# Patient Record
Sex: Male | Born: 1950 | Race: White | Hispanic: No | Marital: Married | Smoking: Former smoker
Health system: Southern US, Community
[De-identification: ages and names within clinical notes are randomized; demographics above are authoritative.]

## PROBLEM LIST (undated history)

## (undated) DIAGNOSIS — F419 Anxiety disorder, unspecified: Secondary | ICD-10-CM

## (undated) DIAGNOSIS — K219 Gastro-esophageal reflux disease without esophagitis: Secondary | ICD-10-CM

## (undated) DIAGNOSIS — R0989 Other specified symptoms and signs involving the circulatory and respiratory systems: Secondary | ICD-10-CM

## (undated) DIAGNOSIS — I10 Essential (primary) hypertension: Secondary | ICD-10-CM

## (undated) DIAGNOSIS — R011 Cardiac murmur, unspecified: Secondary | ICD-10-CM

## (undated) DIAGNOSIS — F329 Major depressive disorder, single episode, unspecified: Secondary | ICD-10-CM

## (undated) DIAGNOSIS — R7309 Other abnormal glucose: Secondary | ICD-10-CM

## (undated) DIAGNOSIS — C801 Malignant (primary) neoplasm, unspecified: Secondary | ICD-10-CM

## (undated) DIAGNOSIS — Q631 Lobulated, fused and horseshoe kidney: Secondary | ICD-10-CM

## (undated) DIAGNOSIS — D509 Iron deficiency anemia, unspecified: Secondary | ICD-10-CM

## (undated) DIAGNOSIS — R253 Fasciculation: Secondary | ICD-10-CM

## (undated) DIAGNOSIS — R0609 Other forms of dyspnea: Secondary | ICD-10-CM

## (undated) DIAGNOSIS — E669 Obesity, unspecified: Secondary | ICD-10-CM

## (undated) HISTORY — DX: Other specified symptoms and signs involving the circulatory and respiratory systems: R09.89

## (undated) HISTORY — DX: Other forms of dyspnea: R06.09

## (undated) HISTORY — DX: Other abnormal glucose: R73.09

## (undated) HISTORY — DX: Cardiac murmur, unspecified: R01.1

## (undated) HISTORY — DX: Essential (primary) hypertension: I10

## (undated) HISTORY — DX: Gastro-esophageal reflux disease without esophagitis: K21.9

## (undated) HISTORY — DX: Major depressive disorder, single episode, unspecified: F32.9

## (undated) HISTORY — PX: COLONOSCOPY: SHX174

## (undated) HISTORY — DX: Obesity, unspecified: E66.9

## (undated) HISTORY — DX: Malignant (primary) neoplasm, unspecified: C80.1

## (undated) HISTORY — PX: TONSILLECTOMY: SUR1361

## (undated) HISTORY — DX: Iron deficiency anemia, unspecified: D50.9

## (undated) HISTORY — DX: Fasciculation: R25.3

## (undated) HISTORY — PX: POLYPECTOMY: SHX149

## (undated) HISTORY — DX: Lobulated, fused and horseshoe kidney: Q63.1

---
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
## 1992-10-02 HISTORY — PX: SHOULDER SURGERY: SHX246

## 1999-09-06 ENCOUNTER — Encounter: Admission: RE | Admit: 1999-09-06 | Discharge: 1999-09-06 | Payer: Self-pay | Admitting: Family Medicine

## 1999-09-06 ENCOUNTER — Encounter: Payer: Self-pay | Admitting: Family Medicine

## 1999-10-21 ENCOUNTER — Encounter: Payer: Self-pay | Admitting: Family Medicine

## 1999-10-21 ENCOUNTER — Encounter: Admission: RE | Admit: 1999-10-21 | Discharge: 1999-10-21 | Payer: Self-pay | Admitting: Family Medicine

## 2000-10-16 ENCOUNTER — Encounter: Payer: Self-pay | Admitting: Family Medicine

## 2000-10-16 ENCOUNTER — Encounter: Admission: RE | Admit: 2000-10-16 | Discharge: 2000-10-16 | Payer: Self-pay | Admitting: Family Medicine

## 2001-01-11 ENCOUNTER — Encounter: Admission: RE | Admit: 2001-01-11 | Discharge: 2001-01-11 | Payer: Self-pay | Admitting: Family Medicine

## 2001-01-11 ENCOUNTER — Encounter: Payer: Self-pay | Admitting: Family Medicine

## 2004-09-28 ENCOUNTER — Encounter: Admission: RE | Admit: 2004-09-28 | Discharge: 2004-09-28 | Payer: Self-pay | Admitting: Family Medicine

## 2006-09-19 ENCOUNTER — Encounter: Admission: RE | Admit: 2006-09-19 | Discharge: 2006-09-19 | Payer: Self-pay | Admitting: Family Medicine

## 2007-03-21 ENCOUNTER — Ambulatory Visit (HOSPITAL_BASED_OUTPATIENT_CLINIC_OR_DEPARTMENT_OTHER): Admission: RE | Admit: 2007-03-21 | Discharge: 2007-03-21 | Payer: Self-pay | Admitting: Orthopedic Surgery

## 2007-10-03 HISTORY — PX: KNEE CARTILAGE SURGERY: SHX688

## 2007-12-27 ENCOUNTER — Ambulatory Visit: Payer: Self-pay | Admitting: Gastroenterology

## 2008-02-11 ENCOUNTER — Encounter: Payer: Self-pay | Admitting: Internal Medicine

## 2008-12-02 ENCOUNTER — Ambulatory Visit: Payer: Self-pay | Admitting: Internal Medicine

## 2008-12-02 DIAGNOSIS — I10 Essential (primary) hypertension: Secondary | ICD-10-CM

## 2008-12-02 DIAGNOSIS — R5383 Other fatigue: Secondary | ICD-10-CM

## 2008-12-02 DIAGNOSIS — F3342 Major depressive disorder, recurrent, in full remission: Secondary | ICD-10-CM | POA: Insufficient documentation

## 2008-12-02 DIAGNOSIS — K219 Gastro-esophageal reflux disease without esophagitis: Secondary | ICD-10-CM

## 2008-12-02 DIAGNOSIS — F329 Major depressive disorder, single episode, unspecified: Secondary | ICD-10-CM | POA: Insufficient documentation

## 2008-12-02 DIAGNOSIS — F3289 Other specified depressive episodes: Secondary | ICD-10-CM

## 2008-12-02 DIAGNOSIS — R5381 Other malaise: Secondary | ICD-10-CM | POA: Insufficient documentation

## 2008-12-02 HISTORY — DX: Essential (primary) hypertension: I10

## 2008-12-02 HISTORY — DX: Gastro-esophageal reflux disease without esophagitis: K21.9

## 2008-12-02 HISTORY — DX: Other specified depressive episodes: F32.89

## 2008-12-02 HISTORY — DX: Major depressive disorder, single episode, unspecified: F32.9

## 2008-12-07 LAB — CONVERTED CEMR LAB
Alkaline Phosphatase: 62 units/L (ref 39–117)
Basophils Absolute: 0 10*3/uL (ref 0.0–0.1)
Bilirubin, Direct: 0.1 mg/dL (ref 0.0–0.3)
Cholesterol: 185 mg/dL (ref 0–200)
GFR calc Af Amer: 129 mL/min
GFR calc non Af Amer: 107 mL/min
LDL Cholesterol: 134 mg/dL — ABNORMAL HIGH (ref 0–99)
Lymphocytes Relative: 35.2 % (ref 12.0–46.0)
MCHC: 33.4 g/dL (ref 30.0–36.0)
Neutrophils Relative %: 48.6 % (ref 43.0–77.0)
Potassium: 3.5 meq/L (ref 3.5–5.1)
RBC: 4.81 M/uL (ref 4.22–5.81)
RDW: 14.1 % (ref 11.5–14.6)
Sodium: 142 meq/L (ref 135–145)
TSH: 3.39 microintl units/mL (ref 0.35–5.50)
Total Bilirubin: 0.7 mg/dL (ref 0.3–1.2)
VLDL: 19 mg/dL (ref 0–40)
Vitamin B-12: 370 pg/mL (ref 211–911)

## 2009-01-25 ENCOUNTER — Telehealth: Payer: Self-pay | Admitting: *Deleted

## 2009-03-22 ENCOUNTER — Encounter: Payer: Self-pay | Admitting: Internal Medicine

## 2009-03-22 ENCOUNTER — Telehealth: Payer: Self-pay | Admitting: Internal Medicine

## 2009-03-30 ENCOUNTER — Telehealth: Payer: Self-pay | Admitting: Internal Medicine

## 2009-03-30 ENCOUNTER — Encounter: Payer: Self-pay | Admitting: Internal Medicine

## 2009-09-27 ENCOUNTER — Ambulatory Visit: Payer: Self-pay | Admitting: Family Medicine

## 2009-09-29 ENCOUNTER — Encounter: Admission: RE | Admit: 2009-09-29 | Discharge: 2009-09-29 | Payer: Self-pay | Admitting: Internal Medicine

## 2009-10-04 ENCOUNTER — Telehealth: Payer: Self-pay | Admitting: Family Medicine

## 2009-10-05 ENCOUNTER — Ambulatory Visit: Payer: Self-pay | Admitting: Internal Medicine

## 2009-10-05 LAB — CONVERTED CEMR LAB: Creatinine, Ser: 1 mg/dL (ref 0.4–1.5)

## 2009-10-08 ENCOUNTER — Ambulatory Visit: Payer: Self-pay | Admitting: Cardiovascular Disease

## 2009-10-11 ENCOUNTER — Telehealth: Payer: Self-pay | Admitting: *Deleted

## 2009-10-15 ENCOUNTER — Ambulatory Visit: Payer: Self-pay | Admitting: Internal Medicine

## 2009-11-24 ENCOUNTER — Encounter (INDEPENDENT_AMBULATORY_CARE_PROVIDER_SITE_OTHER): Payer: Self-pay | Admitting: *Deleted

## 2009-12-10 ENCOUNTER — Telehealth: Payer: Self-pay | Admitting: *Deleted

## 2009-12-13 ENCOUNTER — Telehealth: Payer: Self-pay | Admitting: Internal Medicine

## 2010-01-06 ENCOUNTER — Encounter (INDEPENDENT_AMBULATORY_CARE_PROVIDER_SITE_OTHER): Payer: Self-pay | Admitting: *Deleted

## 2010-01-07 ENCOUNTER — Ambulatory Visit: Payer: Self-pay | Admitting: Gastroenterology

## 2010-01-12 ENCOUNTER — Ambulatory Visit: Payer: Self-pay | Admitting: Internal Medicine

## 2010-01-12 LAB — CONVERTED CEMR LAB
ALT: 22 units/L (ref 0–53)
Albumin: 3.7 g/dL (ref 3.5–5.2)
BUN: 20 mg/dL (ref 6–23)
Basophils Relative: 1.2 % (ref 0.0–3.0)
Bilirubin Urine: NEGATIVE
Chloride: 107 meq/L (ref 96–112)
Cholesterol: 171 mg/dL (ref 0–200)
Eosinophils Relative: 4.7 % (ref 0.0–5.0)
Glucose, Urine, Semiquant: NEGATIVE
HCT: 36.9 % — ABNORMAL LOW (ref 39.0–52.0)
Hemoglobin: 12.4 g/dL — ABNORMAL LOW (ref 13.0–17.0)
LDL Cholesterol: 108 mg/dL — ABNORMAL HIGH (ref 0–99)
Lymphs Abs: 1.5 10*3/uL (ref 0.7–4.0)
MCV: 77.1 fL — ABNORMAL LOW (ref 78.0–100.0)
Monocytes Absolute: 0.5 10*3/uL (ref 0.1–1.0)
Neutro Abs: 2.1 10*3/uL (ref 1.4–7.7)
PSA: 1.39 ng/mL (ref 0.10–4.00)
Platelets: 272 10*3/uL (ref 150.0–400.0)
Potassium: 4.1 meq/L (ref 3.5–5.1)
TSH: 3.12 microintl units/mL (ref 0.35–5.50)
Total Bilirubin: 0.4 mg/dL (ref 0.3–1.2)
Total Protein: 6.8 g/dL (ref 6.0–8.3)
Urobilinogen, UA: 0.2
WBC: 4.3 10*3/uL — ABNORMAL LOW (ref 4.5–10.5)
pH: 7

## 2010-01-14 ENCOUNTER — Ambulatory Visit: Payer: Self-pay | Admitting: Gastroenterology

## 2010-01-19 ENCOUNTER — Ambulatory Visit: Payer: Self-pay | Admitting: Internal Medicine

## 2010-01-19 DIAGNOSIS — R7309 Other abnormal glucose: Secondary | ICD-10-CM | POA: Insufficient documentation

## 2010-01-19 HISTORY — DX: Other abnormal glucose: R73.09

## 2010-01-24 ENCOUNTER — Encounter: Payer: Self-pay | Admitting: Gastroenterology

## 2010-02-11 ENCOUNTER — Ambulatory Visit: Payer: Self-pay | Admitting: Internal Medicine

## 2010-03-03 ENCOUNTER — Encounter: Payer: Self-pay | Admitting: Internal Medicine

## 2010-03-08 ENCOUNTER — Telehealth: Payer: Self-pay | Admitting: *Deleted

## 2010-07-08 ENCOUNTER — Ambulatory Visit: Payer: Self-pay | Admitting: Internal Medicine

## 2010-07-08 LAB — CONVERTED CEMR LAB
Basophils Relative: 1.1 % (ref 0.0–3.0)
Creatinine,U: 96 mg/dL
Eosinophils Absolute: 0.2 10*3/uL (ref 0.0–0.7)
Eosinophils Relative: 4.3 % (ref 0.0–5.0)
Hemoglobin: 11.2 g/dL — ABNORMAL LOW (ref 13.0–17.0)
MCHC: 32.7 g/dL (ref 30.0–36.0)
MCV: 77.2 fL — ABNORMAL LOW (ref 78.0–100.0)
Monocytes Absolute: 0.5 10*3/uL (ref 0.1–1.0)
Neutro Abs: 2.7 10*3/uL (ref 1.4–7.7)
RBC: 4.46 M/uL (ref 4.22–5.81)
WBC: 5.3 10*3/uL (ref 4.5–10.5)

## 2010-07-18 ENCOUNTER — Ambulatory Visit: Payer: Self-pay | Admitting: Internal Medicine

## 2010-07-18 DIAGNOSIS — R0609 Other forms of dyspnea: Secondary | ICD-10-CM

## 2010-07-18 DIAGNOSIS — R0989 Other specified symptoms and signs involving the circulatory and respiratory systems: Secondary | ICD-10-CM | POA: Insufficient documentation

## 2010-07-18 DIAGNOSIS — D509 Iron deficiency anemia, unspecified: Secondary | ICD-10-CM

## 2010-07-18 HISTORY — DX: Other specified symptoms and signs involving the circulatory and respiratory systems: R09.89

## 2010-07-18 HISTORY — DX: Iron deficiency anemia, unspecified: D50.9

## 2010-07-18 HISTORY — DX: Other forms of dyspnea: R06.09

## 2010-08-01 ENCOUNTER — Telehealth: Payer: Self-pay | Admitting: *Deleted

## 2010-08-09 ENCOUNTER — Ambulatory Visit: Payer: Self-pay | Admitting: Internal Medicine

## 2010-08-09 DIAGNOSIS — E669 Obesity, unspecified: Secondary | ICD-10-CM

## 2010-08-09 HISTORY — DX: Obesity, unspecified: E66.9

## 2010-08-10 LAB — CONVERTED CEMR LAB
BUN: 19 mg/dL (ref 6–23)
Basophils Relative: 1.1 % (ref 0.0–3.0)
Chloride: 104 meq/L (ref 96–112)
Creatinine, Ser: 1.1 mg/dL (ref 0.4–1.5)
Eosinophils Absolute: 0.2 10*3/uL (ref 0.0–0.7)
Eosinophils Relative: 4.7 % (ref 0.0–5.0)
Glucose, Bld: 98 mg/dL (ref 70–99)
Hemoglobin: 12.4 g/dL — ABNORMAL LOW (ref 13.0–17.0)
Lymphocytes Relative: 37.8 % (ref 12.0–46.0)
MCHC: 32.9 g/dL (ref 30.0–36.0)
MCV: 78.8 fL (ref 78.0–100.0)
Neutro Abs: 1.9 10*3/uL (ref 1.4–7.7)
Neutrophils Relative %: 46.4 % (ref 43.0–77.0)
Potassium: 4.2 meq/L (ref 3.5–5.1)
RBC: 4.77 M/uL (ref 4.22–5.81)
WBC: 4.1 10*3/uL — ABNORMAL LOW (ref 4.5–10.5)

## 2010-08-17 ENCOUNTER — Telehealth: Payer: Self-pay | Admitting: Pulmonary Disease

## 2010-10-20 ENCOUNTER — Telehealth: Payer: Self-pay | Admitting: *Deleted

## 2010-10-26 ENCOUNTER — Encounter: Payer: Self-pay | Admitting: Internal Medicine

## 2010-11-03 NOTE — Progress Notes (Signed)
Summary: refills  Phone Note From Pharmacy   Caller: [facility] Reason for Call: Needs renewal Details for Reason: amlodipine 10mg , lexapro and potassium Initial call taken by: Romualdo Bolk, CMA Duncan Dull),  December 10, 2009 3:51 PM  Follow-up for Phone Call        Rx sent to [facility]. Follow-up by: Romualdo Bolk, CMA (AAMA),  December 10, 2009 3:52 PM    Prescriptions: KLOR-CON M20 20 MEQ CR-TABS (POTASSIUM CHLORIDE CRYS CR) 1 by mouth once daily  #90 x 0   Entered by:   Romualdo Bolk, CMA (AAMA)   Authorized by:   Madelin Headings MD   Signed by:   Romualdo Bolk, CMA (AAMA) on 12/10/2009   Method used:   Electronically to        [facility] (mail-order)             ,          Ph: [phone]       Fax: [phone]   RxID:   [id number] LEXAPRO 10 MG TABS (ESCITALOPRAM OXALATE) 1 by mouth once daily  #90 x 0   Entered by:   Romualdo Bolk, CMA (AAMA)   Authorized by:   Madelin Headings MD   Signed by:   Romualdo Bolk, CMA (AAMA) on 12/10/2009   Method used:   Electronically to        [facility] (mail-order)             ,          Ph: [phone]       Fax: [phone]   RxID:   [id number] NORVASC 10 MG TABS (AMLODIPINE BESYLATE) 1 by mouth once daily  #90 x 0   Entered by:   Romualdo Bolk, CMA (AAMA)   Authorized by:   Madelin Headings MD   Signed by:   Romualdo Bolk, CMA (AAMA) on 12/10/2009   Method used:   Electronically to        [facility]* (mail-order)             ,          Ph: [phone]       Fax: [phone]   RxID:   [id number]

## 2010-11-03 NOTE — Letter (Signed)
Summary: Previsit letter  [facility]  [address] [place], [place] [address]   Phone: [phone]  Fax: [phone]       11/24/2009 MRN: [id number]  Jaime Orr [address] [place], [place]  [address]  Dear Jaime Orr,  Welcome to the Gastroenterology Division at [facility].    You are scheduled to see a nurse for your pre-procedure visit on 01-07-10 at 9am on the 3rd floor at [facility], [address]  We ask that you try to arrive at our office 15 minutes prior to your appointment time to allow for check-in.  Your nurse visit will consist of discussing your medical and surgical history, your immediate family medical history, and your medications.    Please bring a complete list of all your medications or, if you prefer, bring the medication bottles and we will list them.  We will need to be aware of both prescribed and over the counter drugs.  We will need to know exact dosage information as well.  If you are on blood thinners (Coumadin, Plavix, Aggrenox, Ticlid, etc.) please call our office today/prior to your appointment, as we need to consult with your physician about holding your medication.   Please be prepared to read and sign documents such as consent forms, a financial agreement, and acknowledgement forms.  If necessary, and with your consent, a friend or relative is welcome to sit-in on the nurse visit with you.  Please bring your insurance card so that we may make a copy of it.  If your insurance requires a referral to see a specialist, please bring your referral form from your primary care physician.  No co-pay is required for this nurse visit.     If you cannot keep your appointment, please call [phone] to cancel or reschedule prior to your appointment date.  This allows [place] the opportunity to schedule an appointment for another patient in need of care.    Thank you for choosing [facility] for your medical needs.  We  appreciate the opportunity to care for you.  Please visit [place] at our website  to learn more about our practice.                     Sincerely.                                                                                                                   The Gastroenterology Division

## 2010-11-03 NOTE — Consult Note (Signed)
Summary: [facility]  [facility]   Imported By: Maryln Gottron 03/15/2010 14:49:10  _____________________________________________________________________  External Attachment:    Type:   Image     Comment:   External Document

## 2010-11-03 NOTE — Assessment & Plan Note (Signed)
Summary: 3 wk rov//slm   Vital Signs:  Patient profile:   60 year old male Weight:      212 pounds BMI:     32.11 Pulse rate:   60 / minute BP sitting:   130 / 84  (left arm) Cuff size:   large  Vitals Entered By: Romualdo Bolk, CMA (AAMA) (August 09, 2010 8:29 AM)  Nutrition Counseling: Patient's BMI is greater than 25 and therefore counseled on weight management options.  Serial Vital Signs/Assessments:  Time      Position  BP       Pulse  Resp  Temp     By           R Arm     140/78                         Madelin Headings MD           L Arm     138/80                         Madelin Headings MD  Comments: reg cuff sitting By: Madelin Headings MD   CC: Follow-up visit , Hypertension Management   History of Present Illness: Jaime Orr comes in today   for follow up of a number issues with very high BP and choking episodes at night and anemia.   HT  taking  320 /hctz 25 and feels better.  no beer etoh for 3 weeks   F eels well. sleeping better and no more choking  at night . wife says snoring is present but no apnea now?Marland Kitchen  No obv se of meds  but gets   one second of lightheadedness  or so  when upright and cough.  No syncope HA neuro signs .  Feels generally better .  Losing weight helping. Taking iron  since last visit and to recheck today . NO bleeding.  Hypertension History:      He complains of side effects from treatment, but denies headache, chest pain, palpitations, dyspnea with exertion, orthopnea, PND, peripheral edema, visual symptoms, and neurologic problems.  He notes the following problems with antihypertensive medication side effects: Dizziness this past weekend.   see hpi.        Positive major cardiovascular risk factors include male age 52 years old or older and hypertension.  Negative major cardiovascular risk factors include non-tobacco-user status.     Preventive Screening-Counseling & Management  Alcohol-Tobacco     Alcohol drinks/day: 2  Alcohol type: beer     Smoking Status: quit     Year Quit: 2010  Caffeine-Diet-Exercise     Caffeine use/day: 5+     Does Patient Exercise: yes     Type of exercise: Walking 5 miles a day for work     Times/week: 5  Current Medications (verified): 1)  Lexapro 10 Mg Tabs (Escitalopram Oxalate) .Marland Kitchen.. 1 By Mouth Once Daily 2)  Nexium 40 Mg Cpdr (Esomeprazole Magnesium) .Marland Kitchen.. 1 By Mouth Once Daily 3)  Norvasc 10 Mg Tabs (Amlodipine Besylate) .Marland Kitchen.. 1 By Mouth Once Daily 4)  Klor-Con M20 20 Meq Cr-Tabs (Potassium Chloride Crys Cr) .Marland Kitchen.. 1 By Mouth Once Daily 5)  Diovan Hct 320-25 Mg Tabs (Valsartan-Hydrochlorothiazide) .Marland Kitchen.. 1 By Mouth Once Daily  Allergies (verified): 1)  ! Codeine  Past History:  Past medical, surgical, family and social histories (including risk  factors) reviewed, and no changes noted (except as noted below).  Past Medical History: Reviewed history from 01/19/2010 and no changes required. Depression  2005    on lexapro recently Hypertension   in 1980a  GERD  1995 no endo  Regular blood donor. 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 Left knee menscicus 2009 Left Shoulder-1994 Tonsillectomy-1963  Past History:  Care Management: Gastroenterology: Arlyce Dice  Family History: Reviewed history from 12/02/2008 and no changes required. Father: HBP, heart disease, alcoholism  38  Mother: stroke, depression   died at   101 Siblings: healthy  2 sister     one with sleep apnea   Social History: Reviewed history from 10/15/2009 and no changes required. 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  ocass   relapse  Alcohol use-yes  about 16-18 per week  has decreased for this  month Drug use-no Regular exercise-yes originally  from [place]  to [facility] of 2   3 dogs 2 cats   Review of Systems  The patient denies anorexia, fever, weight gain, vision loss, decreased hearing, hoarseness, chest pain, dyspnea on exertion, peripheral edema, prolonged cough, abdominal pain, melena, hematochezia, severe indigestion/heartburn, transient blindness, difficulty walking, abnormal bleeding, enlarged lymph nodes, and angioedema.    Physical Exam  General:  Well-developed,well-nourished,in no acute distress; alert,appropriate and cooperative throughout examination Head:  normocephalic and atraumatic.   Neck:  No deformities, masses, or tenderness noted. Lungs:  normal respiratory effort and no intercostal retractions.   Heart:  normal rate, regular rhythm, no murmur, and no gallop.   Cervical Nodes:  No lymphadenopathy noted Psych:  Oriented X3, good eye contact, not anxious appearing, and not depressed appearing.     Impression & Recommendations:  Problem # 1:  HYPERTENSION (ICD-401.9) Assessment Improved much better with weight loss and  no alcohol and increase meds.   still border line.  counseled about monitor  and call if too low or symptoms of concern .   check bmp today   limit alcohol in future   to 2 at a sitting and only few days per week  for medical reasons .  His updated medication list for this problem includes:    Norvasc 10 Mg Tabs (Amlodipine besylate) .Marland Kitchen... 1 by mouth once daily    Diovan Hct 320-25 Mg Tabs (Valsartan-hydrochlorothiazide) .Marland Kitchen... 1 by mouth once daily  Orders: TLB-BMP (Basic Metabolic Panel-BMET) (80048-METABOL)  Problem # 2:  IRON DEFICIENCY (ICD-280.9) recheck to day  on iron for repacement . then plan follow up  Orders: TLB-CBC Platelet - w/Differential (85025-CBCD)  Problem # 3:  DEPRESSION (ICD-311) Assessment: Unchanged doing well  His updated medication list for this problem includes:    Lexapro 10 Mg Tabs  (Escitalopram oxalate) .Marland Kitchen... 1 by mouth once daily  Problem # 4:  SNORING (ICD-786.09) better   no more choking and snoring improved.     off of alcohol etc.   ok to wait on OSA eval   but probably has this  . continue lifestyle intervention  and rechedule if needed.  His updated medication list for this problem includes:  Diovan Hct 320-25 Mg Tabs (Valsartan-hydrochlorothiazide) .Marland Kitchen... 1 by mouth once daily  Problem # 5:  OBESITY (ICD-278.00) has lost weight    12 pounds with lifestyle intervention and doing better.  continue  Ht: 68.25 (01/19/2010)   Wt: 212 (08/09/2010)   BMI: 31.36 (01/19/2010)  Complete Medication List: 1)  Lexapro 10 Mg Tabs (Escitalopram oxalate) .Marland Kitchen.. 1 by mouth once daily 2)  Nexium 40 Mg Cpdr (Esomeprazole magnesium) .Marland Kitchen.. 1 by mouth once daily 3)  Norvasc 10 Mg Tabs (Amlodipine besylate) .Marland Kitchen.. 1 by mouth once daily 4)  Klor-con M20 20 Meq Cr-tabs (Potassium chloride crys cr) .Marland Kitchen.. 1 by mouth once daily 5)  Diovan Hct 320-25 Mg Tabs (Valsartan-hydrochlorothiazide) .Marland Kitchen.. 1 by mouth once daily  Hypertension Assessment/Plan:      The patient's hypertensive risk group is category B: At least one risk factor (excluding diabetes) with no target organ damage.  His calculated 10 year risk of coronary heart disease is 14 %.  Today's blood pressure is 130/84.  His blood pressure goal is < 140/90.  Patient Instructions: 1)  continue same  medication. 2)  You will be informed of lab results when available.  3)  Ok to    call and   put  sleep apnea   evaluation on hold as you are doing better but reschedule  if choking episodes return. 4)  return office visit   in  3 months or   as  indicated by lab. 5)  Get your  own BP monitor and bring it to the next appt. 6)  Call if meantime with problems.  Prescriptions: DIOVAN HCT 320-25 MG TABS (VALSARTAN-HYDROCHLOROTHIAZIDE) 1 by mouth once daily  #90 x 3   Entered and Authorized by:   Madelin Headings MD   Signed by:   Madelin Headings  MD on 08/09/2010   Method used:   Electronically to        [facility]* (retail)             ,          Ph: [phone]       Fax: [phone]   RxID:   [id number]    Orders Added: 1)  TLB-CBC Platelet - w/Differential [85025-CBCD] 2)  TLB-BMP (Basic Metabolic Panel-BMET) [80048-METABOL] 3)  Est. Patient Level IV [84132]  Appended Document: Orders Update     Clinical Lists Changes  Orders: Added new Service order of Specimen Handling (44010) - Signed Added new Service order of Venipuncture (27253) - Signed

## 2010-11-03 NOTE — Progress Notes (Signed)
Summary: nos appt  Phone Note Call from Patient   Caller: [email or web address]  Call For: sood Summary of Call: LMTCB x2 to rsc nos from 11/15. Initial call taken by: Darletta Moll,  August 17, 2010 2:30 PM

## 2010-11-03 NOTE — Letter (Signed)
Summary: Patient Notice- Polyp Results  [facility]  [address] [place], [place] [address]   Phone: [phone]  Fax: [phone]        January 24, 2010 MRN: [id number]    Jaime Orr [address] [place], [place]  [address]    Dear [name],  I am pleased to inform you that the colon polyp(s) removed during your recent colonoscopy was (were) found to be benign (no cancer detected) upon pathologic examination.  I recommend you have a repeat colonoscopy examination in 5_ years to look for recurrent polyps, as having colon polyps increases your risk for having recurrent polyps or even colon cancer in the future.  Should you develop new or worsening symptoms of abdominal pain, bowel habit changes or bleeding from the rectum or bowels, please schedule an evaluation with either your primary care physician or with me.  Additional information/recommendations:  __ No further action with gastroenterology is needed at this time. Please      follow-up with your primary care physician for your other healthcare      needs.  __ Please call [phone] to schedule a return visit to review your      situation.  __ Please keep your follow-up visit as already scheduled.  _x_ Continue treatment plan as outlined the day of your exam.  Please call us if you are having persistent problems or have questions about your condition that have not been fully answered at this time.  Sincerely,  Louis Meckel MD  This letter has been electronically signed by your physician.  Appended Document: Patient Notice- Polyp Results letter mailed 4.26.11

## 2010-11-03 NOTE — Progress Notes (Signed)
Summary: teat results needed  Phone Note Call from Patient Call back at [phone]   Caller: Patient-live call Reason for Call: Lab or Test Results Summary of Call: requesting ultrasaound results from last week. Initial call taken by: Warnell Forester,  October 04, 2009 8:47 AM  Follow-up for Phone Call        see my append on the [place] and call him please Follow-up by: Nelwyn Salisbury MD,  October 04, 2009 10:04 AM  Additional Follow-up for Phone Call Additional follow up Details #1::        pt aware Additional Follow-up by: Alfred Levins, CMA,  October 04, 2009 10:21 AM

## 2010-11-03 NOTE — Progress Notes (Signed)
Summary: refill for mail order  Phone Note Refill Request Call back at Work Phone [phone] Message from:  Patient---live call  Refills Requested: Medication #1:  DIOVAN HCT 320-25 MG TABS 1 by mouth once daily.  Medication #2:  NORVASC 10 MG TABS 1 by mouth once daily   Brand Name Necessary? No  Medication #3:  LEXAPRO 10 MG TABS 1 by mouth once daily send to [facility]----policy number---[id number]---please put on rx.  Initial call taken by: Warnell Forester,  October 20, 2010 10:35 AM  Follow-up for Phone Call        Rx sent to pharmacy Follow-up by: Romualdo Bolk, CMA Duncan Dull),  October 20, 2010 10:55 AM    Prescriptions: DIOVAN HCT 320-25 MG TABS (VALSARTAN-HYDROCHLOROTHIAZIDE) 1 by mouth once daily  #90 x 0   Entered by:   Romualdo Bolk, CMA (AAMA)   Authorized by:   Madelin Headings MD   Signed by:   Romualdo Bolk, CMA (AAMA) on 10/20/2010   Method used:   Faxed to ...       [facility] (mail-order)       [address] [place], [place]  [address]       Ph: [phone]       Fax: [phone]   RxID:   [id number] NORVASC 10 MG TABS (AMLODIPINE BESYLATE) 1 by mouth once daily  #90 x 0   Entered by:   Romualdo Bolk, CMA (AAMA)   Authorized by:   Madelin Headings MD   Signed by:   Romualdo Bolk, CMA (AAMA) on 10/20/2010   Method used:   Faxed to ...       [facility] (mail-order)       [address] [place], [place]  [address]       Ph: [phone]       Fax: [phone]   RxID:   [id number] LEXAPRO 10 MG TABS (ESCITALOPRAM OXALATE) 1 by mouth once daily  #90 x 0   Entered by:   Romualdo Bolk, CMA (AAMA)   Authorized by:   Madelin Headings MD   Signed by:   Romualdo Bolk, CMA (AAMA) on 10/20/2010   Method used:   Faxed to ...       [facility] (mail-order)       [address] [place], [place]  [address]       Ph: [phone]       Fax: [phone]   RxID:   [id number]

## 2010-11-03 NOTE — Assessment & Plan Note (Signed)
Summary: cpx/njr pt rsc/njr   Vital Signs:  Patient profile:   60 year old male Height:      68.25 inches Weight:      207 pounds BMI:     31.36 Pulse rate:   60 / minute BP sitting:   130 / 80  (left arm) Cuff size:   regular  Vitals Entered By: Romualdo Bolk, CMA (AAMA) (January 19, 2010 1:37 PM)  Nutrition Counseling: Patient's BMI is greater than 25 and therefore counseled on weight management options. CC: CPX   History of Present Illness: Jaime Orr   comes in for wellness visit and lab follow  up.  Since last visit  here  there have been no major changes in health status  .  However he fell oout of the truck and twisted his back yesterday and hurts left lower back without radiation . No contusion or weakness .   ? if muscle relaxant will help.  Bp no  se of meds.  has lost some weight recently trying to eat better. Mood lexapro  doing ok  for 5 years.   wants to stay on meds.  donates blood   regularly.   Has not been rejected  Had colonscopy recently. Hasnt cut down that much alcohol but planning to.  Preventive Care Screening  Prior Values:    PSA:  1.39 (01/12/2010)    Colonoscopy:  DONE (01/14/2010)    Last Tetanus Booster:  Historical (10/02/2006)   Preventive Screening-Counseling & Management  Alcohol-Tobacco     Alcohol drinks/day: 2     Alcohol type: beer     Smoking Status: quit     Year Quit: 2010  Caffeine-Diet-Exercise     Caffeine use/day: 5+     Does Patient Exercise: yes     Type of exercise: Walking 5 miles a day for work     Times/week: 5  Hep-HIV-STD-Contraception     Dental Visit-last 6 months yes     Sun Exposure-Excessive: no  Safety-Violence-Falls     Seat Belt Use: yes     Firearms in the Home: firearms in the home     Smoke Detectors: yes      Blood Transfusions:  no.    Current Medications (verified): 1)  Lexapro 10 Mg Tabs (Escitalopram Oxalate) .Marland Kitchen.. 1 By Mouth Once Daily 2)  Nexium 40 Mg Cpdr (Esomeprazole  Magnesium) .Marland Kitchen.. 1 By Mouth Once Daily 3)  Norvasc 10 Mg Tabs (Amlodipine Besylate) .Marland Kitchen.. 1 By Mouth Once Daily 4)  Klor-Con M20 20 Meq Cr-Tabs (Potassium Chloride Crys Cr) .Marland Kitchen.. 1 By Mouth Once Daily 5)  Diovan Hct 320-12.5 Mg Tabs (Valsartan-Hydrochlorothiazide) .Marland Kitchen.. 1 Poqd  Allergies (verified): 1)  ! Codeine  Past History:  Past medical, surgical, family and social histories (including risk factors) reviewed, and no changes noted (except as noted below).  Past Medical History: Depression  2005    on lexapro recently Hypertension   in 1980a  GERD  1995 no endo  Regular blood donor. 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 Left knee menscicus 2009 Left Shoulder-1994 Tonsillectomy-1963  Past History:  Care Management: Gastroenterology: Arlyce Dice  Family History: Reviewed history from 12/02/2008 and no changes required. Father: HBP,  heart disease, alcoholism  24  Mother: stroke, depression   died at   6 Siblings: healthy  2 sister     one with sleep apnea   Social History: Reviewed history from 10/15/2009 and no changes required. 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  ocass   relapse  Alcohol use-yes  about 16-18 per week Drug use-no Regular exercise-yes originally  from [place]  to [place]  [facility] of 2   3 dogs 2 cats   Seat Belt Use:  yes Dental Care w/in 6 mos.:  yes Sun Exposure-Excessive:  no Blood Transfusions:  no  Review of Systems       The patient complains of abdominal pain.  The patient denies anorexia, fever, weight gain, vision loss, decreased hearing, hoarseness, chest pain, syncope, dyspnea on exertion, peripheral edema, prolonged cough, headaches,  hemoptysis, melena, hematochezia, severe indigestion/heartburn, hematuria, incontinence, genital sores, muscle weakness, suspicious skin lesions, transient blindness, difficulty walking, unusual weight change, abnormal bleeding, enlarged lymph nodes, angioedema, and testicular masses.         right abd pain at times  no prostate obstructive symptom  Physical Exam General Appearance: well developed, well nourished, no acute distress Eyes: conjunctiva and lids normal, PERRLA, EOMI, WNL Ears, Nose, Mouth, Throat: TM clear, nares clear, oral exam WNL Neck: supple, no lymphadenopathy, no thyromegaly, no JVD Respiratory: clear to auscultation and percussion, respiratory effort normal Cardiovascular: regular rate and rhythm, S1-S2, no murmur, rub or gallop, no bruits, peripheral pulses normal and symmetric, no cyanosis, clubbing, edema or varicosities Chest: no scars, masses, tenderness; no asymmetry, skin changes, nipple discharge, no gynecomastia   Gastrointestinal: soft, minimal tenderness ruq spot ; no hepatosplenomegaly, masses; active bowel sounds all quadrants,  Lymphatic: no cervical, axillary or inguinal adenopathy Musculoskeletal: gait normal, muscle strength WNL, no joint swelling, effusions, discoloration, crepitus  tender left lower lumbar area  uncomfortable sitting no lesions  Skin: clear, good turgor, color WNL, no rashes, lesions, or ulcerations Neurologic: normal mental status, normal reflexes, normal strength, sensation, and motion Psychiatric: alert; oriented to person, place and time Other Exam:   EKG NSR no acute changes  see labs    Impression & Recommendations:  Problem # 1:  PREVENTIVE HEALTH CARE (ICD-V70.0)  continue to lose weith Discussed nutrition,exercise,diet,healthy weight, vitamin D and calcium.   Orders: EKG w/ Interpretation (93000)  Problem # 2:  HYPERTENSION (ICD-401.9) Assessment: Improved  controlled  weight loss will help The following medications  were removed from the medication list:    Diovan Hct 160-12.5 Mg Tabs (Valsartan-hydrochlorothiazide) .Marland Kitchen... 1 by mouth once daily His updated medication list for this problem includes:    Norvasc 10 Mg Tabs (Amlodipine besylate) .Marland Kitchen... 1 by mouth once daily    Diovan Hct 320-12.5 Mg Tabs (Valsartan-hydrochlorothiazide) .Marland Kitchen... 1 poqd  Orders: EKG w/ Interpretation (93000)  Problem # 3:  DEPRESSION (ICD-311) Assessment: Unchanged disc wean when wishes  His updated medication list for this problem includes:    Lexapro 10 Mg Tabs (Escitalopram oxalate) .Marland Kitchen... 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   Problem # 6:  HYPERGLYCEMIA, MILD (ICD-790.29) Assessment: New counseled   Complete Medication List: 1)  Lexapro 10 Mg  Tabs (Escitalopram oxalate) .Marland Kitchen.. 1 by mouth once daily 2)  Nexium 40 Mg Cpdr (Esomeprazole magnesium) .Marland Kitchen.. 1 by mouth once daily 3)  Norvasc 10 Mg Tabs (Amlodipine besylate) .Marland Kitchen.. 1 by mouth once daily 4)  Klor-con M20 20 Meq Cr-tabs (Potassium chloride crys cr) .Marland Kitchen.. 1 by mouth once daily 5)  Diovan Hct 320-12.5 Mg Tabs (Valsartan-hydrochlorothiazide) .Marland Kitchen.. 1 poqd 6)  Cyclobenzaprine Hcl 10 Mg Tabs (Cyclobenzaprine hcl) .Marland Kitchen.. 1 by mouth three times a day as needed  for muscle spasm.  Patient Instructions: 1)  can try going down to 5 mg  lexapro every other day or every day. 2)  Contiue BP  medications. 3)  Your blood sugar is borderline elevated  4)  Continue to decrease alcohol and simple sweets and sugars and  continue to lose weight.  5)  I suspect your anemia is temporary from your blood donation  6)  Can use muscle relaxant as needed for your back  and recheck as needed.  7)  Please schedule a follow-up appointment in 6 months .  8)  HgBA1c prior to visit   ICD-9:    790.2  9)  Urine Microalbumin prior to visit ICD-9 : 401.9 10)  CBCdiff b12 folate  IBC  panel   anemia. Prescriptions: DIOVAN HCT 320-12.5 MG TABS (VALSARTAN-HYDROCHLOROTHIAZIDE) 1 poqd  #90 x 3   Entered and Authorized by:   Madelin Headings MD   Signed by:   Madelin Headings MD on 01/19/2010   Method used:   Electronically to        [facility]* (mail-order)             ,          Ph: [phone]       Fax: [phone]   RxID:   [id number] CYCLOBENZAPRINE HCL 10 MG TABS (CYCLOBENZAPRINE HCL) 1 by mouth three times a day as needed  for muscle spasm.  #30 x 0   Entered and Authorized by:   Madelin Headings MD   Signed by:   Madelin Headings MD on 01/19/2010   Method used:   Electronically to        [facility] [phone]* (retail)       [address]       [place], [place]  [address]       Ph: [phone] or [phone]       Fax: [phone]   RxID:   [id number]

## 2010-11-03 NOTE — Progress Notes (Signed)
Summary: samples please  Phone Note Call from Patient Call back at [phone]   Caller: Patient Call For: Madelin Headings MD Summary of Call: PT would like more samples of diovan 320-12.5mg  Initial call taken by: Heron Sabins,  December 13, 2009 12:34 PM  Follow-up for Phone Call        please give samples if avaialbe  /  what is his BP ? Follow-up by: Madelin Headings MD,  December 14, 2009 5:19 PM  Additional Follow-up for Phone Call Additional follow up Details #1::        Samples available up front. LM for pt. Additional Follow-up by: Romualdo Bolk, CMA (AAMA),  December 15, 2009 9:22 AM

## 2010-11-03 NOTE — Assessment & Plan Note (Signed)
Summary: TESTICULAR TENDERNESS AND SWELLING // RS   Vital Signs:  Patient profile:   60 year old male Weight:      219 pounds Temp:     98.9 degrees F oral Pulse rate:   66 / minute BP sitting:   150 / 90  (left arm) Cuff size:   regular  Vitals Entered By: Romualdo Bolk, CMA (AAMA) (Feb 11, 2010 1:49 PM) CC: Growth next to left testical that is tender that has been there for awhile. Pt was seen by another md in the past that gave him a antibiotic and told him not to worry about it. But now it has gotten bigger and more tender than normal.   History of Present Illness: Jaime Orr comesin comes in today  for acute  SDA issue .  Had declined  Gu exam at check up.    But has had a swelling in lweft scrotal area above testicle  for a while , recently ? bigger swelling l.  IN the past  was treated for infection and told not to be concerned about the area but thinks getting larger and wife encouraged him to get checked out.  No fever or dysuria or uti signs otherwise.   No abd pain.   NO hernia  or pain with cough.  no urinary stream problems.   BP : no change on meds   Preventive Screening-Counseling & Management  Alcohol-Tobacco     Alcohol drinks/day: 2     Alcohol type: beer     Smoking Status: quit     Year Quit: 2010  Caffeine-Diet-Exercise     Caffeine use/day: 5+     Does Patient Exercise: yes     Type of exercise: Walking 5 miles a day for work     Times/week: 5  Current Medications (verified): 1)  Lexapro 10 Mg Tabs (Escitalopram Oxalate) .Marland Kitchen.. 1 By Mouth Once Daily 2)  Nexium 40 Mg Cpdr (Esomeprazole Magnesium) .Marland Kitchen.. 1 By Mouth Once Daily 3)  Norvasc 10 Mg Tabs (Amlodipine Besylate) .Marland Kitchen.. 1 By Mouth Once Daily 4)  Klor-Con M20 20 Meq Cr-Tabs (Potassium Chloride Crys Cr) .Marland Kitchen.. 1 By Mouth Once Daily 5)  Diovan Hct 320-12.5 Mg Tabs (Valsartan-Hydrochlorothiazide) .Marland Kitchen.. 1 Poqd 6)  Cyclobenzaprine Hcl 10 Mg Tabs (Cyclobenzaprine Hcl) .Marland Kitchen.. 1 By Mouth Three Times A Day  As Needed  For Muscle Spasm.  Allergies (verified): 1)  ! Codeine  Past History:  Past medical, surgical, family and social histories (including risk factors) reviewed for relevance to current acute and chronic problems.  Past Medical History: Reviewed history from 01/19/2010 and no changes required. Depression  2005    on lexapro recently Hypertension   in 1980a  GERD  1995 no endo  Regular blood donor. 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 Left knee menscicus 2009 Left Shoulder-1994 Tonsillectomy-1963  Past History:  Care Management: Gastroenterology: Arlyce Dice  Family History: Reviewed history from 12/02/2008 and no changes required. Father: HBP, heart disease, alcoholism  65  Mother: stroke, depression   died at   79 Siblings: healthy  2 sister     one with sleep apnea   Social History: Reviewed history from 10/15/2009 and no changes required.  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    Physical Exam  General:  Well-developed,well-nourished,in no acute distress; alert,appropriate and cooperative throughout examination Genitalia:  circumcised and no varicocele.  ;eft   2-+ cm soft cystic  mass ?  minimally tender no redness and no firmness   no henia   NO hydrocele ?      Impression & Recommendations:  Problem # 1:  SCROTAL MASS ? CYST (ICD-608.89) ? large  epididymal cyst        ? larger over time.  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 1 by mouth once daily    Diovan Hct 320-12.5 Mg Tabs (Valsartan-hydrochlorothiazide) .Marland Kitchen... 1 poqd  BP today: 150/90 Prior BP: 130/80 (01/19/2010)  Labs Reviewed: K+: 4.1 (01/12/2010) Creat: : 1.0 (01/12/2010)   Chol: 171 (01/12/2010)   HDL: 39.40 (01/12/2010)   LDL: 108 (01/12/2010)   TG: 116.0 (01/12/2010)  Complete Medication List: 1)  Lexapro 10 Mg Tabs (Escitalopram oxalate) .Marland Kitchen.. 1 by mouth once daily 2)  Nexium 40 Mg Cpdr (Esomeprazole magnesium) .Marland Kitchen.. 1 by mouth once daily 3)  Norvasc 10 Mg Tabs (Amlodipine besylate) .Marland Kitchen.. 1 by mouth once daily 4)  Klor-con M20 20 Meq Cr-tabs (Potassium chloride crys cr) .Marland Kitchen.. 1 by mouth once daily 5)  Diovan Hct 320-12.5 Mg Tabs (Valsartan-hydrochlorothiazide) .Marland Kitchen.. 1 poqd 6)  Cyclobenzaprine Hcl 10 Mg Tabs (Cyclobenzaprine hcl) .Marland Kitchen.. 1 by mouth three times a day as needed  for muscle spasm. 7)  Ciprofloxacin Hcl 500 Mg Tabs (Ciprofloxacin hcl) .Marland Kitchen.. 1 by mouth two times a day  Patient Instructions: 1)  will do urology  consult.   poss  epidydimal cyst large  2)  If  gets red and swollen in the meantime then   can add antibioitic.  Prescriptions: CIPROFLOXACIN HCL 500 MG TABS (CIPROFLOXACIN HCL) 1 by mouth two times a day  #20 x 0   Entered and Authorized by:   Madelin Headings MD   Signed by:   Madelin Headings MD on 02/11/2010   Method used:   Print then Give to Patient   RxID:   [id number]

## 2010-11-03 NOTE — Assessment & Plan Note (Signed)
Summary: F/U FROM CT // RS   Vital Signs:  Patient profile:   60 year old male Weight:      218 pounds Pulse rate:   72 / minute BP sitting:   140 / 80  (left arm) Cuff size:   regular  Vitals Entered By: Romualdo Bolk, CMA (AAMA) (October 15, 2009 9:45 AM) CC: Follow-up visit on CT results- Pt wants to discuss BP when donating blood and he almost didn't get to donate due to iron being low. Pt states that he can sleep 10 hours a day on lexapro. Pt states that he has a vibration in the left side of his chest for 1 sec since he has been worried about his CT results. No other symptoms.   History of Present Illness: Jaime Orr comesin today for   about 6 mos hx of sore to touch RUQ pain waxes and wanes mostly sore to touch  not interfereing or severe.  has it most days.    Saw Dr Clent Ridges who did an [place] and then ct. her is here for follow up of these results . NO labs done except . cr bun for  ct. last labs about a year ago.  He has gained weight over the recent past using more alcohol and snacking  and less exercise.   He donates blood every 56 days  and bp tends to be high there .   last donation 2 weeks ago.   BP readings    140-150 range  .  lifestyle    changed to sendentary job increase weight  and alcohol at times when bowling and eating.  GERD: nexium helps  reflux.   Preventive Screening-Counseling & Management  Alcohol-Tobacco     Alcohol drinks/day: 2     Alcohol type: beer     Smoking Status: quit     Year Quit: 2010  Caffeine-Diet-Exercise     Caffeine use/day: 5+     Does Patient Exercise: yes     Type of exercise: Walking 5 miles a day for work     Times/week: 5  Current Medications (verified): 1)  Lexapro 10 Mg Tabs (Escitalopram Oxalate) .Marland Kitchen.. 1 By Mouth Once Daily 2)  Nexium 40 Mg Cpdr (Esomeprazole Magnesium) .Marland Kitchen.. 1 By Mouth Once Daily 3)  Diovan Hct 160-12.5 Mg Tabs (Valsartan-Hydrochlorothiazide) .Marland Kitchen.. 1 By Mouth Once Daily 4)  Norvasc 10 Mg Tabs  (Amlodipine Besylate) .Marland Kitchen.. 1 By Mouth Once Daily 5)  Klor-Con M20 20 Meq Cr-Tabs (Potassium Chloride Crys Cr) .Marland Kitchen.. 1 By Mouth Once Daily  Allergies (verified): 1)  ! Codeine  Past History:  Past medical, surgical, family and social histories (including risk factors) reviewed, and no changes noted (except as noted below).  Past Medical History: Depression  2005    on lexapro recently Hypertension   in 1980a  GERD  1995 no endo  Regular blood donor.        LAST Td: 2008 Colonscopy: n/a EKG: with in 5 years (2005) Eye Exam: 2 years ago (2008) Other: Smoking: former  Consults Eulah Pont and Wyline Mood  Past Surgical History: Reviewed history from 12/02/2008 and no changes required. Left knee menscicus 2009 Left Shoulder-1994 Tonsillectomy-1963  Past History:  Care Management: None Current  Family History: Reviewed history from 12/02/2008 and no changes required. Father: HBP, heart disease, alcoholism  26  Mother: stroke, depression   died at   52 Siblings: healthy  2 sister     one with sleep apnea  Social History: Reviewed history from 12/02/2008 and no changes required. 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  ocass   relapse  Alcohol use-yes  about 16-18 per week Drug use-no Regular exercise-yes originally  from [place]  to [facility] of 2   3 dogs 2 cats   Caffeine use/day:  5+  Review of Systems  The patient denies anorexia, fever, weight loss, vision loss, decreased hearing, hoarseness, chest pain, syncope, dyspnea on exertion, peripheral edema, prolonged cough, headaches, hemoptysis, melena, hematochezia, severe indigestion/heartburn, hematuria, muscle weakness, transient blindness, difficulty walking, depression, unusual weight change, abnormal bleeding, enlarged lymph nodes, and angioedema.         vibration feeling transient left upper chest at times   at times . no palpitations or CV symptom    Physical Exam  General:  Well-developed,well-nourished,in no acute distress; alert,appropriate and cooperative throughout examination Head:  normocephalic and atraumatic.   Eyes:  vision grossly intact and pupils equal.   Ears:  R ear normal, L ear normal, and no external deformities.   Neck:  No deformities, masses, or tenderness noted. Lungs:  Normal respiratory effort, chest expands symmetrically. Lungs are clear to auscultation, no crackles or wheezes. Heart:  Normal rate and regular rhythm. S1 and S2 normal without gallop, murmur, click, rub or other extra sounds.no lifts.   Abdomen:  soft, non-tender, normal bowel sounds, no masses, no rebound tenderness, no abdominal hernia, no hepatomegaly, and no splenomegaly.    points to ruq area.  Msk:  no joint warmth, no redness over joints, and no joint deformities.   Pulses:  pulses intact without delay   Neurologic:  non focal   alert & oriented X3 and gait normal.   Skin:  turgor normal, color normal, no ecchymoses, and no petechiae.   Cervical Nodes:  No lymphadenopathy noted Psych:  Oriented X3, good eye contact, not anxious appearing, and not depressed appearing.     Impression & Recommendations:  Problem # 1:  ABDOMINAL PAIN, RIGHT UPPER QUADRANT (ICD-789.01) Assessment Unchanged subtle and almost seems ms   liver ok on scans but no lfts   done  .   consider  gb scan in future but sould do lifestyle intervention first . also labs  to be done . He still needs to get colonoscopy.   Problem # 2:  HYPERTENSION (ICD-401.9) Assessment: Deteriorated counseled and lifestyle intervention  important. increase  med dose   and lifestyle intervention  His updated medication list for this problem includes:    Diovan Hct 160-12.5 Mg Tabs (Valsartan-hydrochlorothiazide) .Marland Kitchen... 1 by mouth once daily    Norvasc 10 Mg Tabs (Amlodipine besylate) .Marland Kitchen... 1 by mouth once daily    Diovan Hct 320-12.5 Mg Tabs (Valsartan-hydrochlorothiazide) .Marland Kitchen... 1  poqd  Problem # 3:  GERD (ICD-530.81) Assessment: Unchanged  His updated medication list for this problem includes:    Nexium 40 Mg Cpdr (Esomeprazole magnesium) .Marland Kitchen... 1 by mouth once daily  Problem # 4:  donates blood   ? if low   prob nl  needs colon  and recheck in March.   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   Complete Medication List: 1)  Lexapro 10 Mg Tabs (Escitalopram oxalate) .Marland Kitchen.. 1 by mouth once daily 2)  Nexium  40 Mg Cpdr (Esomeprazole magnesium) .Marland Kitchen.. 1 by mouth once daily 3)  Diovan Hct 160-12.5 Mg Tabs (Valsartan-hydrochlorothiazide) .Marland Kitchen.. 1 by mouth once daily 4)  Norvasc 10 Mg Tabs (Amlodipine besylate) .Marland Kitchen.. 1 by mouth once daily 5)  Klor-con M20 20 Meq Cr-tabs (Potassium chloride crys cr) .Marland Kitchen.. 1 by mouth once daily 6)  Diovan Hct 320-12.5 Mg Tabs (Valsartan-hydrochlorothiazide) .Marland Kitchen.. 1 poqd  Patient Instructions: 1)  It is not healthy for men to drink more then 2-3 drinks per day or for women to drink more then 1-2 drinks per day.  2)  You need to lose weight. Consider a lower calorie diet and regular exercise.  3)  Increase  Bp medication   4)  Limit alcohol 5)  DASH diet . 6)  Decrease  salt and sodium in processed food.    7)  CPX  lab s in March and then cpx.   8)  Will consider having  consult about your kidney cysts. 9)  Get colonoscopy.

## 2010-11-03 NOTE — Progress Notes (Signed)
Summary: [place] results  Phone Note Call from Patient Call back at [phone]   Caller: Patient Summary of Call: Pt is wanting to know about the [place] results. Initial call taken by: Romualdo Bolk, CMA (AAMA),  October 11, 2009 3:15 PM  Follow-up for Phone Call        tell patient results of CT scan  has  horshoe kidney  not causing a problem.   No cuase of pain.   follow up  appt when convenient.  Follow-up by: Madelin Headings MD,  October 11, 2009 4:11 PM  Additional Follow-up for Phone Call Additional follow up Details #1::        Pt aware of results. Additional Follow-up by: Romualdo Bolk, CMA (AAMA),  October 11, 2009 4:20 PM

## 2010-11-03 NOTE — Progress Notes (Signed)
Summary: refills  Phone Note From Pharmacy   Caller: [facility] Reason for Call: Needs renewal Details for Reason: amlodipine, kcl and lexapro Initial call taken by: Romualdo Bolk, CMA Duncan Dull),  March 08, 2010 1:56 PM  Follow-up for Phone Call        Rx sent to pharmacy Follow-up by: Romualdo Bolk, CMA (AAMA),  March 08, 2010 1:58 PM    Prescriptions: KLOR-CON M20 20 MEQ CR-TABS (POTASSIUM CHLORIDE CRYS CR) 1 by mouth once daily  #90 x 1   Entered by:   Romualdo Bolk, CMA (AAMA)   Authorized by:   Madelin Headings MD   Signed by:   Romualdo Bolk, CMA (AAMA) on 03/08/2010   Method used:   Electronically to        [facility]* (mail-order)             ,          Ph: [phone]       Fax: [phone]   RxID:   [id number] NORVASC 10 MG TABS (AMLODIPINE BESYLATE) 1 by mouth once daily  #90 x 1   Entered by:   Romualdo Bolk, CMA (AAMA)   Authorized by:   Madelin Headings MD   Signed by:   Romualdo Bolk, CMA (AAMA) on 03/08/2010   Method used:   Electronically to        [facility]* (mail-order)             ,          Ph: [phone]       Fax: [phone]   RxID:   [id number] LEXAPRO 10 MG TABS (ESCITALOPRAM OXALATE) 1 by mouth once daily  #90 x 1   Entered by:   Romualdo Bolk, CMA (AAMA)   Authorized by:   Madelin Headings MD   Signed by:   Romualdo Bolk, CMA (AAMA) on 03/08/2010   Method used:   Electronically to        [facility]* (mail-order)             ,          Ph: [phone]       Fax: [phone]   RxID:   [id number]

## 2010-11-03 NOTE — Assessment & Plan Note (Signed)
Summary: 6 month rov/njr   Vital Signs:  Patient profile:   60 year old male Weight:      224 pounds Pulse rate:   78 / minute BP sitting:   174 / 90  (right arm) Cuff size:   large  Vitals Entered By: Romualdo Bolk, CMA (AAMA) (July 18, 2010 8:11 AM)  Serial Vital Signs/Assessments:  Time      Position  BP       Pulse  Resp  Temp     By           R Arm     150/80                         Madelin Headings MD           L Arm     148/84                         Madelin Headings MD  Comments: large sitting By: Madelin Headings MD   CC: Follow-up visit on labs, Hypertension Management   History of Present Illness: Jaime Orr  comes in today  f r.fu of a number of issues . HT: BP readings  has been high when tried to donate blood Also was  high atLife insurance physical  .   says he is taking meds  regularly .  since last   visit has gained some weight.  no teating.   regularly   Snores  ? if osa      sleep "   lot and still tired.     ? if lexapro adding this.   Has some fatigue  Etoh intake is daily and over 2-3 at times   Hypertension History:      He complains of peripheral edema, but denies headache, chest pain, palpitations, dyspnea with exertion, orthopnea, PND, visual symptoms, neurologic problems, syncope, and side effects from treatment.  He notes no problems with any antihypertensive medication side effects.  Swelling of ankles.        Positive major cardiovascular risk factors include male age 81 years old or older and hypertension.  Negative major cardiovascular risk factors include non-tobacco-user status.     Preventive Screening-Counseling & Management  Alcohol-Tobacco     Alcohol drinks/day: 2     Alcohol type: beer     Smoking Status: quit     Year Quit: 2010  Caffeine-Diet-Exercise     Caffeine use/day: 5+     Does Patient Exercise: yes     Type of exercise: Walking 5 miles a day for work     Times/week: 5  Current Medications  (verified): 1)  Lexapro 10 Mg Tabs (Escitalopram Oxalate) .Marland Kitchen.. 1 By Mouth Once Daily 2)  Nexium 40 Mg Cpdr (Esomeprazole Magnesium) .Marland Kitchen.. 1 By Mouth Once Daily 3)  Norvasc 10 Mg Tabs (Amlodipine Besylate) .Marland Kitchen.. 1 By Mouth Once Daily 4)  Klor-Con M20 20 Meq Cr-Tabs (Potassium Chloride Crys Cr) .Marland Kitchen.. 1 By Mouth Once Daily 5)  Diovan Hct 320-12.5 Mg Tabs (Valsartan-Hydrochlorothiazide) .Marland Kitchen.. 1 Poqd  Allergies (verified): 1)  ! Codeine  Past History:  Past medical, surgical, family and social histories (including risk factors) reviewed, and no changes noted (except as noted below).  Past Medical History: Reviewed history from 01/19/2010 and no changes required. Depression  2005    on lexapro recently Hypertension   in 1980a  GERD  1995 no endo  Regular blood donor. 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 Left knee menscicus 2009 Left Shoulder-1994 Tonsillectomy-1963  Past History:  Care Management: Gastroenterology: Arlyce Dice  Family History: Reviewed history from 12/02/2008 and no changes required. Father: HBP, heart disease, alcoholism  38  Mother: stroke, depression   died at   33 Siblings: healthy  2 sister     one with sleep apnea   Social History: Reviewed history from 10/15/2009 and no changes required. 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  ocass   relapse  Alcohol use-yes  about 16-18 per week Drug use-no Regular exercise-yes originally  from [place]  to [facility] of 2   3 dogs 2 cats   Review of Systems  The patient denies anorexia, fever, weight loss, vision loss, decreased hearing, chest pain, syncope, dyspnea on exertion, prolonged cough, melena,  hematochezia, hematuria, transient blindness, difficulty walking, abnormal bleeding, enlarged lymph nodes, and angioedema.    Physical Exam  General:  Well-developed,well-nourished,in no acute distress; alert,appropriate and cooperative throughout examination Head:  normocephalic and atraumatic.   Eyes:  PERRL, EOMs full, conjunctiva clear  Ears:  R ear normal and L ear normal.   Mouth:  pharynx pink and moist.   Neck:  No deformities, masses, or tenderness noted. Lungs:  Normal respiratory effort, chest expands symmetrically. Lungs are clear to auscultation, no crackles or wheezes. Heart:  Normal rate and regular rhythm. S1 and S2 normal without gallop, murmur, click, rub or other extra sounds. Abdomen:  Bowel sounds positive,abdomen soft and non-tender without masses, organomegaly or hernias noted. Pulses:  pulses intact without delay   Neurologic:  alert & oriented X3, strength normal in all extremities, and gait normal.   Skin:  turgor normal, color normal, no ecchymoses, and no petechiae.   Cervical Nodes:  No lymphadenopathy noted Psych:  Normal eye contact, appropriate affect. Cognition appears normal.    Impression & Recommendations:  Problem # 1:  HYPERTENSION (ICD-401.9) Assessment Deteriorated  poss alcohol and poss osa intervening  oncrease to 320.25  The following medications were removed from the medication list:    Diovan Hct 320-12.5 Mg Tabs (Valsartan-hydrochlorothiazide) .Marland Kitchen... 1 poqd His updated medication list for this problem includes:    Norvasc 10 Mg Tabs (Amlodipine besylate) .Marland Kitchen... 1 by mouth once daily    Diovan Hct 320-25 Mg Tabs (Valsartan-hydrochlorothiazide) .Marland Kitchen... 1 by mouth once daily  BP today: 174/90 Prior BP: 150/90 (02/11/2010)  10 Yr Risk Heart Disease: 22 %  Labs Reviewed: K+: 4.1 (01/12/2010) Creat: : 1.0 (01/12/2010)   Chol: 171 (01/12/2010)   HDL: 39.40 (01/12/2010)   LDL: 108 (01/12/2010)   TG: 116.0 (01/12/2010)  Problem # 2:  IRON  DEFICIENCY (ICD-280.9) ldonation  every 56 days.   due     probable  the cause    .     Problem # 3:  HYPERGLYCEMIA, MILD (ICD-790.29) a1c borderline. Labs Reviewed: Creat: 1.0 (01/12/2010)     Problem # 4:  SNORING (ICD-786.09)  poss osa   The following medications were removed from the medication list:    Diovan Hct 320-12.5 Mg  Tabs (Valsartan-hydrochlorothiazide) .Marland Kitchen... 1 poqd His updated medication list for this problem includes:    Diovan Hct 320-25 Mg Tabs (Valsartan-hydrochlorothiazide) .Marland Kitchen... 1 by mouth once daily  Orders: Pulmonary Referral (Pulmonary)  Problem # 5:  heavy alcohol use  disc  this as interfering with sleep HT   mo0d and weight health.counseled   Complete Medication List: 1)  Lexapro 10 Mg Tabs (Escitalopram oxalate) .Marland Kitchen.. 1 by mouth once daily 2)  Nexium 40 Mg Cpdr (Esomeprazole magnesium) .Marland Kitchen.. 1 by mouth once daily 3)  Norvasc 10 Mg Tabs (Amlodipine besylate) .Marland Kitchen.. 1 by mouth once daily 4)  Klor-con M20 20 Meq Cr-tabs (Potassium chloride crys cr) .Marland Kitchen.. 1 by mouth once daily 5)  Diovan Hct 320-25 Mg Tabs (Valsartan-hydrochlorothiazide) .Marland Kitchen.. 1 by mouth once daily  Other Orders: Admin 1st Vaccine ([address]) Flu Vaccine 4yrs + ([address]454)  Hypertension Assessment/Plan:      The patient's hypertensive risk group is category B: At least one risk factor (excluding diabetes) with no target organ damage.  His calculated 10 year risk of coronary heart disease is 22 %.  Today's blood pressure is 174/90.  His blood pressure goal is < 140/90.  Patient Instructions: 1)  Decrease alcohol as this can contribute to your fatigue elevated Blood pressure and    sleep issues. 2)  we should consider  sleep apnea evaluation also. 3)  will do referral 4)  Stop alcohol for the next 2-3 weeks  to see effect . 5)  Change  diovan320/25 mg  6)  You mild anemia is still prob from  blood donation.  7)  Consider  Multivitamin with iron as you are slightly anemic .  8)  return office visit  in 3-4 weeks .   Orders Added: 1)  Admin 1st Vaccine [90471] 2)  Flu Vaccine 58yrs + [90658] 3)  Est. Patient Level IV [09811] 4)  Pulmonary Referral [Pulmonary]  Flu Vaccine Consent Questions     Do you have a history of severe allergic reactions to this vaccine? no    Any prior history of allergic reactions to egg and/or gelatin? no    Do you have a sensitivity to the preservative Thimersol? no    Do you have a past history of Guillan-Barre Syndrome? no    Do you currently have an acute febrile illness? no    Have you ever had a severe reaction to latex? no    Vaccine information given and explained to patient? yes    Are you currently pregnant? no    Lot Number:[id number]   Exp Date:04/01/2011   Site Given.  Left Deltoid IM Romualdo Bolk, CMA (AAMA)  July 18, 2010 8:15 AM  1)  Admin 1st Vaccine [phone] 2)  Flu Vaccine 62yrs + [phone]       .lbflu

## 2010-11-03 NOTE — Miscellaneous (Signed)
Summary: [facility] Previsit/prep  Clinical Lists Changes  Medications: Added new medication of MOVIPREP 100 GM  SOLR (PEG-KCL-NACL-NASULF-NA ASC-C) As per prep instructions. - Signed Rx of MOVIPREP 100 GM  SOLR (PEG-KCL-NACL-NASULF-NA ASC-C) As per prep instructions.;  #1 x 0;  Signed;  Entered by: [name] RN;  Authorized by: Louis Meckel MD;  Method used: [facility] [phone]*, [address] [place], [place]  [address], Ph: [phone] or [phone], Fax: [phone] Allergies: Changed allergy or adverse reaction from CODEINE to CODEINE Observations: Added new observation of ALLERGY REV: Done (01/07/2010 9:18)    Prescriptions: MOVIPREP 100 GM  SOLR (PEG-KCL-NACL-NASULF-NA ASC-C) As per prep instructions.  #1 x 0   Entered by:   [name] RN   Authorized by:   Louis Meckel MD   Signed by:   [name] RN on 01/07/2010   Method used:   [facility]        [facility] [phone][facility] (retail)       [address]       [place], [place]  [address]       Ph: [phone] or [phone]       Fax: [phone]   RxID:   [id number]

## 2010-11-03 NOTE — Letter (Signed)
Summary: [facility] Instructions  [facility]  [address] [place], [place] [address]   Phone: [phone]  Fax: [phone]       Jaime Orr    1954/11/12    MRN: [id number]        Procedure Day Dorna Bloom:  Jaime Orr  01/14/10     Arrival Time:  2:00PM     Procedure Time:  3:00PM     Location of Procedure:                    _ X_  [facility] (4th Floor)                        PREPARATION FOR COLONOSCOPY WITH MOVIPREP   Starting 5 days prior to your procedure 01/09/10 do not eat nuts, seeds, popcorn, corn, beans, peas,  salads, or any raw vegetables.  Do not take any fiber supplements (e.g. Metamucil, Citrucel, and Benefiber).  THE DAY BEFORE YOUR PROCEDURE         DATE: 01/13/10  DAY: THURSDAY  1.  Drink clear liquids the entire day-NO SOLID FOOD  2.  Do not drink anything colored red or purple.  Avoid juices with pulp.  No orange juice.  3.  Drink at least 64 oz. (8 glasses) of fluid/clear liquids during the day to prevent dehydration and help the prep work efficiently.  CLEAR LIQUIDS INCLUDE: Water Jello Ice Popsicles Tea (sugar ok, no milk/cream) Powdered fruit flavored drinks Coffee (sugar ok, no milk/cream) Gatorade Juice: apple, white grape, white cranberry  Lemonade Clear bullion, consomm, broth Carbonated beverages (any kind) Strained chicken noodle soup Hard Candy                             4.  In the morning, mix first dose of MoviPrep solution:    Empty 1 Pouch A and 1 Pouch B into the disposable container    Add lukewarm drinking water to the top line of the container. Mix to dissolve    Refrigerate (mixed solution should be used within 24 hrs)  5.  Begin drinking the prep at 5:00 p.m. The MoviPrep container is divided by 4 marks.   Every 15 minutes drink the solution down to the next mark (approximately 8 oz) until the full liter is complete.   6.  Follow completed prep with 16 oz of clear liquid of your choice  (Nothing red or purple).  Continue to drink clear liquids until bedtime.  7.  Before going to bed, mix second dose of MoviPrep solution:    Empty 1 Pouch A and 1 Pouch B into the disposable container    Add lukewarm drinking water to the top line of the container. Mix to dissolve    Refrigerate  THE DAY OF YOUR PROCEDURE      DATE: 01/14/10 DAY: FRIDAY  Beginning at 10:00AM (5 hours before procedure):         1. Every 15 minutes, drink the solution down to the next mark (approx 8 oz) until the full liter is complete.  2. Follow completed prep with 16 oz. of clear liquid of your choice.    3. You may drink clear liquids until 1:00PM (2 HOURS BEFORE PROCEDURE).   MEDICATION INSTRUCTIONS  Unless otherwise instructed, you should take regular prescription medications with a small sip of water   as early as possible the morning of  your procedure.           OTHER INSTRUCTIONS  You will need a responsible adult at least 58 years of age to accompany you and drive you home.   This person must remain in the waiting room during your procedure.  Wear loose fitting clothing that is easily removed.  Leave jewelry and other valuables at home.  However, you may wish to bring a book to read or  an iPod/MP3 player to listen to music as you wait for your procedure to start.  Remove all body piercing jewelry and leave at home.  Total time from sign-in until discharge is approximately 2-3 hours.  You should go home directly after your procedure and rest.  You can resume normal activities the  day after your procedure.  The day of your procedure you should not:   Drive   Make legal decisions   Operate machinery   Drink alcohol   Return to work  You will receive specific instructions about eating, activities and medications before you leave.    The above instructions have been reviewed and explained to me by  [name] RN  January 07, 2010 9:42 AM     I fully  understand and can verbalize these instructions _____________________________ Date _________

## 2010-11-03 NOTE — Procedures (Signed)
Summary: Colonoscopy  Patient: Jaime Orr Note: All result statuses are Final unless otherwise noted.  Tests: (1) Colonoscopy (COL)   COL Colonoscopy           DONE     [facility]     [address]     [place], [place]  [address]           COLONOSCOPY PROCEDURE REPORT           PATIENT:  Jaime Orr, Jaime Orr  MR#:  [id number]     BIRTHDATE:  [birth year], 58 yrs. old  GENDER:  male           ENDOSCOPIST:  Barbette Hair. Arlyce Dice, MD     Referred by:  Neta Mends. Panosh, M.D.           PROCEDURE DATE:  01/14/2010     PROCEDURE:  Colonoscopy with snare polypectomy     ASA CLASS:  Class II     INDICATIONS:  1) Routine Risk Screening           MEDICATIONS:   Fentanyl 75 mcg IV, Versed 9 mg IV           DESCRIPTION OF PROCEDURE:   After the risks benefits and     alternatives of the procedure were thoroughly explained, informed     consent was obtained.  Digital rectal exam was performed and     revealed no abnormalities.   The LB [id number] endoscope     was introduced through the anus and advanced to the cecum, which     was identified by both the appendix and ileocecal valve, without     limitations.  The quality of the prep was excellent, using     MoviPrep.  The instrument was then slowly withdrawn as the colon     was fully examined.     <<PROCEDUREIMAGES>>           FINDINGS:  A sessile polyp was found in the descending colon. It     was 4 mm in size. Polyp was snared without cautery. Retrieval was     successful (see image10). snare polyp  This was otherwise a normal     examination of the colon (see image2, image3, image5, image4,     image8, image12, image13, and image14).   Retroflexed views in the     rectum revealed no abnormalities.    The time to cecum =  2.5     minutes. The scope was then withdrawn (time =  7.5  min) from the     patient and the procedure completed.           COMPLICATIONS:  None           ENDOSCOPIC IMPRESSION:     1) 4 mm sessile  polyp in the descending colon     2) Otherwise normal examination     RECOMMENDATIONS:     1) If the polyp(s) removed today are proven to be adenomatous     (pre-cancerous) polyps, you will need a repeat colonoscopy in 5     years. Otherwise you should continue to follow colorectal cancer     screening guidelines for "routine risk" patients with colonoscopy     in 10 years.           REPEAT EXAM:  You will receive a letter from Dr. Arlyce Dice in 1-2     weeks, after reviewing the final pathology, with followup  recommendations.           ______________________________     Barbette Hair Arlyce Dice, MD           CC:           n.     eSIGNED:   Barbette Hair. Hansika Leaming at 01/14/2010 04:09 PM           Colin Ina, [id number]  Note: An exclamation mark (!) indicates a result that was not dispersed into the flowsheet. Document Creation Date: 01/14/2010 4:09 PM _______________________________________________________________________  (1) Order result status: Final Collection or observation date-time: 01/14/2010 16:02 Requested date-time:  Receipt date-time:  Reported date-time:  Referring Physician:   Ordering Physician: Melvia Heaps [phone]) Specimen Source:  Source: Launa Grill Order Number: [id number] Lab site:   Appended Document: Colonoscopy     Procedures Next Due Date:    Colonoscopy: 01/2015

## 2010-11-03 NOTE — Progress Notes (Signed)
Summary: labs?  Phone Note Call from Patient Call back at Home Phone [phone]   Caller: vm Summary of Call: Appt one week.  Labs needed? Initial call taken by: Rudy Jew, RN,  August 01, 2010 4:32 PM  Follow-up for Phone Call        No labs needed. According to last ov visit. Pt aware of this. Follow-up by: Romualdo Bolk, CMA (AAMA),  August 01, 2010 4:40 PM

## 2010-11-14 ENCOUNTER — Ambulatory Visit (INDEPENDENT_AMBULATORY_CARE_PROVIDER_SITE_OTHER): Payer: Self-pay | Admitting: Internal Medicine

## 2010-11-14 ENCOUNTER — Encounter: Payer: Self-pay | Admitting: Internal Medicine

## 2010-11-14 DIAGNOSIS — R253 Fasciculation: Secondary | ICD-10-CM | POA: Insufficient documentation

## 2010-11-14 DIAGNOSIS — R259 Unspecified abnormal involuntary movements: Secondary | ICD-10-CM

## 2010-11-14 DIAGNOSIS — R7309 Other abnormal glucose: Secondary | ICD-10-CM

## 2010-11-14 DIAGNOSIS — I10 Essential (primary) hypertension: Secondary | ICD-10-CM

## 2010-11-14 DIAGNOSIS — D509 Iron deficiency anemia, unspecified: Secondary | ICD-10-CM

## 2010-11-14 DIAGNOSIS — E669 Obesity, unspecified: Secondary | ICD-10-CM

## 2010-11-14 DIAGNOSIS — F329 Major depressive disorder, single episode, unspecified: Secondary | ICD-10-CM

## 2010-11-14 HISTORY — DX: Fasciculation: R25.3

## 2010-11-14 LAB — BASIC METABOLIC PANEL
CO2: 28 mEq/L (ref 19–32)
Chloride: 99 mEq/L (ref 96–112)
Creatinine, Ser: 1 mg/dL (ref 0.4–1.5)
Glucose, Bld: 95 mg/dL (ref 70–99)

## 2010-11-14 LAB — CBC WITH DIFFERENTIAL/PLATELET
Basophils Absolute: 0 10*3/uL (ref 0.0–0.1)
Eosinophils Relative: 3.6 % (ref 0.0–5.0)
HCT: 38.5 % — ABNORMAL LOW (ref 39.0–52.0)
Hemoglobin: 12.9 g/dL — ABNORMAL LOW (ref 13.0–17.0)
Lymphs Abs: 1.7 10*3/uL (ref 0.7–4.0)
Monocytes Relative: 11 % (ref 3.0–12.0)
Neutro Abs: 2.2 10*3/uL (ref 1.4–7.7)
RDW: 13.9 % (ref 11.5–14.6)

## 2010-11-14 LAB — HEMOGLOBIN A1C: Hgb A1c MFr Bld: 6.1 % (ref 4.6–6.5)

## 2010-11-14 MED ORDER — POTASSIUM CHLORIDE CRYS ER 20 MEQ PO TBCR: 20.0000 meq | EXTENDED_RELEASE_TABLET | Freq: Two times a day (BID) | ORAL | Status: DC

## 2010-11-14 NOTE — Assessment & Plan Note (Signed)
No change in medications stable. Cautioned to limit alcohol.

## 2010-11-14 NOTE — Progress Notes (Signed)
Patient comes in today for fu of   multiple medical issues . No major change in health status since last visit .  In November  2011  HT: taking medication without problem but did run out of his potassium 2 weeks ago. He hasn't really checked his readings since the last visit. He has restarted drinking alcohol like he did before . About 3 drinks per evening.  Anemia   No bleeding had some iron deficiency but also had frequent blood donation. He was able to donate blood in  December without difficulty. Depression;  Says his depression is stable no change on Lexapro Obesity:  No further weight loss. ROS:  He has noted a problem with his left forearm The elbow where he gets occasional muscle twitching without associated pain or weakness in that area he is right handed does not no causes this has no weakness or numbness. He also sometimes feels that in his left lower neck area he feels his pulse. But no associated pain no chest pain shortness of breath or palpitations.  PE  Well-developed well-nourished in no acute distress vital signs reviewed normocephalic atraumatic eyes PERRLA neck supple without masses or bruit.   I do not feel a mass in the supraclavicular area and he has no lymphadenopathy. Chest CTA cardiac S1-S2 no gallops or murmurs upper extremity pulses without delay. Abdomen soft without organomegaly.  extremities no atrophy good range of motion and strength in the acute joint swelling.  Skin no present bleeding petechiae

## 2010-11-14 NOTE — Assessment & Plan Note (Signed)
Donated blood in December  And  As ok.

## 2010-11-14 NOTE — Patient Instructions (Addendum)
Decrease alcohol again  Monitor your Blood pressure    My  readings were elevated  cotinue med .  Will notify you  of labs when available.  Get you own BP machine to monitor your readings.

## 2010-11-14 NOTE — Assessment & Plan Note (Signed)
No problem with meds and sleep is good.

## 2010-11-14 NOTE — Assessment & Plan Note (Signed)
Will follow lifestyle intervention importance.

## 2010-11-14 NOTE — Assessment & Plan Note (Signed)
Neuro exam seems nonfocal an unrevealing. This could be an overuse issue but rule out low calcium magnesium or potassium today.

## 2010-11-21 ENCOUNTER — Encounter: Payer: Self-pay | Admitting: *Deleted

## 2011-01-09 ENCOUNTER — Telehealth: Payer: Self-pay | Admitting: *Deleted

## 2011-01-09 MED ORDER — AMLODIPINE BESYLATE 10 MG PO TABS: 10.0000 mg | ORAL_TABLET | Freq: Every day | ORAL | Status: DC

## 2011-01-09 MED ORDER — VALSARTAN-HYDROCHLOROTHIAZIDE 320-25 MG PO TABS: 1.0000 | ORAL_TABLET | Freq: Every day | ORAL | Status: DC

## 2011-01-09 MED ORDER — ESCITALOPRAM OXALATE 10 MG PO TABS: 10.0000 mg | ORAL_TABLET | Freq: Every day | ORAL | Status: DC

## 2011-01-09 NOTE — Telephone Encounter (Signed)
rx sent to pharmacy

## 2011-01-31 ENCOUNTER — Ambulatory Visit (INDEPENDENT_AMBULATORY_CARE_PROVIDER_SITE_OTHER): Payer: Federal, State, Local not specified - PPO | Admitting: Internal Medicine

## 2011-01-31 ENCOUNTER — Telehealth: Payer: Self-pay | Admitting: *Deleted

## 2011-01-31 ENCOUNTER — Ambulatory Visit
Admission: RE | Admit: 2011-01-31 | Discharge: 2011-01-31 | Disposition: A | Discharge status: Home / Self Care | Payer: Federal, State, Local not specified - PPO | Source: Ambulatory Visit | Attending: Internal Medicine | Admitting: Internal Medicine

## 2011-01-31 ENCOUNTER — Other Ambulatory Visit: Payer: Self-pay | Admitting: Internal Medicine

## 2011-01-31 ENCOUNTER — Encounter: Payer: Self-pay | Admitting: Internal Medicine

## 2011-01-31 ENCOUNTER — Other Ambulatory Visit: Payer: Federal, State, Local not specified - PPO

## 2011-01-31 VITALS — BP 130/80 | HR 60 | Temp 99.2°F | Wt 215.0 lb

## 2011-01-31 DIAGNOSIS — F329 Major depressive disorder, single episode, unspecified: Secondary | ICD-10-CM

## 2011-01-31 DIAGNOSIS — M79604 Pain in right leg: Secondary | ICD-10-CM

## 2011-01-31 DIAGNOSIS — Z7289 Other problems related to lifestyle: Secondary | ICD-10-CM

## 2011-01-31 DIAGNOSIS — R7309 Other abnormal glucose: Secondary | ICD-10-CM

## 2011-01-31 DIAGNOSIS — M7989 Other specified soft tissue disorders: Secondary | ICD-10-CM | POA: Insufficient documentation

## 2011-01-31 DIAGNOSIS — I10 Essential (primary) hypertension: Secondary | ICD-10-CM

## 2011-01-31 DIAGNOSIS — Q638 Other specified congenital malformations of kidney: Secondary | ICD-10-CM

## 2011-01-31 DIAGNOSIS — Q631 Lobulated, fused and horseshoe kidney: Secondary | ICD-10-CM | POA: Insufficient documentation

## 2011-01-31 MED ORDER — MELOXICAM 15 MG PO TABS: 15.0000 mg | ORAL_TABLET | Freq: Every day | ORAL | Status: DC

## 2011-01-31 NOTE — Telephone Encounter (Signed)
Left message on machine about dvt being neg and that mobic was being called in.  Then rov in 2 weeks.

## 2011-01-31 NOTE — Patient Instructions (Signed)
If   Doppler is negative for blood clots .   We will have you take antiinflammatory for 1-2 weeks. Monitor your blood pressure cause this could interfere with the medication. If not better in 1-2 weeks the call for advice and follow up.  If Bp is ok can change your followup appt to [facility] with labs in  6 months

## 2011-01-31 NOTE — Assessment & Plan Note (Signed)
With pain and tenderness possible acute tendinitis rule out DVT.  Consider other causes if persistent.

## 2011-01-31 NOTE — Progress Notes (Signed)
  Subjective:    Patient ID: Jaime Orr, male    DOB: 02-Feb-1951, 60 y.o.   MRN: [id number]  HPI Patient comes in today for an acute visit because of his right lower extremity swollen and tender. Onset was about 3 days ago. No history of trauma. 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 Has never had this problem before. He has some swelling and some crepitus sound of the right distal extremity. He is taking no medication for this.  He has a followup visit in 10 days I would like to use this as this is a recheck. Anemia: He continues to donate blood and is prescreened are normal. Blood pressure: Has a machine but hasn't checked it was okay for blood donation. Taking same medication we'll made a refill before next visit. Alcohol: He is him back to his previous baseline just out of boredom and states that he doesn't feel like he has significant depression at this point and is coping okay. No bruising or bleeding history of gout.  Past history family history social history reviewed in the electronic medical record.   Review of Systems Negative for chest pain shortness of breath cough no history of DVT.    Objective:   Physical Exam Well-developed well-nourished in no acute distress vital signs as above. Neck no masses or nodules Chest:  Clear to A&P without wheezes rales or rhonchi CV:  S1-S2 no gallops or murmurs peripheral perfusion is normal Extr pulses intact RLE: Swelling and some tenderness and some warmth in the distal right leg above the ankle. It is in the front and the back there is no streaking there is no ulcers. There is crepitus over the extensor tendons of the foot anteriorly on the lower leg. No bursal effusion.   Pulses and feet are normal no bony tenderness.     Reviewed last office visit and labs. Assessment & Plan:  Right lower extremity tenderness and swelling.   Was crepitus noted this may well be an acute inflammatory tendinitis. But get Doppler  test to rule out venous occlusion as he was having prolonged driving traveling with his legs down.    His Doppler is negative will begin an anti-inflammatory for a couple weeks with GI protection and monitoring of his blood pressure and followup if just not getting better.  Hypertension  seems to have adequate control.   Can recheck in 6 months at a checkup. Anemia: Felt to be from blood donation and not significant. Alcohol intake   Counseled about effects Heart and blood pressure and mood.

## 2011-02-13 ENCOUNTER — Ambulatory Visit (INDEPENDENT_AMBULATORY_CARE_PROVIDER_SITE_OTHER): Payer: Federal, State, Local not specified - PPO | Admitting: Internal Medicine

## 2011-02-13 ENCOUNTER — Encounter: Payer: Self-pay | Admitting: Internal Medicine

## 2011-02-13 VITALS — BP 126/70 | HR 62 | Temp 99.4°F | Wt 218.0 lb

## 2011-02-13 DIAGNOSIS — M7989 Other specified soft tissue disorders: Secondary | ICD-10-CM

## 2011-02-13 DIAGNOSIS — I10 Essential (primary) hypertension: Secondary | ICD-10-CM

## 2011-02-13 DIAGNOSIS — T148XXA Other injury of unspecified body region, initial encounter: Secondary | ICD-10-CM

## 2011-02-13 NOTE — Progress Notes (Signed)
  Subjective:    Patient ID: Jaime Orr, male    DOB: Mar 16, 1951, 60 y.o.   MRN: [id number]  HPI Patient comes in today for followup of swelling of his right lower extremity. Since his last visit he did have a negative Doppler test. He changed the type of socks that was compressing in the area. The swelling and redness is down although it is still a little bit tender. He did not take meloxicam after reading about it concerned about side effects.  In the meantime he has gotten a blood pressure machine which is confirming normal blood pressure readings.  He also has scaly scab on each extensor surface of the elbow was started on the right and downgoing on the left. No pain itching known trauma or doesn't remember leaning on irritated areas Other than above he feels well.   Review of Systems Negative for fever chest pain redness joint swelling no elbow pain    Objective:   Physical Exam Well-developed well-nourished in no acute distress. Gait is normal Musculoskeletal:  Good range of motion crepitus of the right lower extremity and foot dorsi flexors is no longer present. Swelling is down but still present no redness or warmth. Joint is without swelling or redness  elbow shows normal range of motion no effusion or bursal swelling Skin:   Area of skin abrasion and cracking right greater than left less than a centimeter about 5 mm there is  bilaterally no fragments or discharge.    Assessment & Plan:  Right lower strandy swelling probable tendinitis overuse improved with relative rest. No further workup needed at this time if significant flare we cannot use prednisone. Do not think this is a metabolic systemic problem.  Skin areas on elbows look like cracking abrasions  not impetigo or staph infection. However avoid trauma use topical antibiotic ointment until heals if not getting better in a couple weeks or any kind of progression call for reeval  Hypertension  good today Plan checkup  with labs in 6 months. Agree with healthy weight loss attempts.

## 2011-02-13 NOTE — Patient Instructions (Signed)
Agree with plan about foot ... seem liked overuse tendinitis. Can  Use cold if flares again. Elbow  Does  not seem like .  Disease.   Use antibiotic ointment can call if  looks infected .

## 2011-02-14 NOTE — Op Note (Signed)
NAME:  Jaime Orr, Jaime Orr NO.:  [id number]   MEDICAL RECORD NO.:  [id number]          PATIENT TYPE:  AMB   LOCATION:  [facility]                          FACILITY:  [facility]   PHYSICIAN:  Loreta Ave, M.D. DATE OF BIRTH:  [birth year]   DATE OF PROCEDURE:  03/21/2007  DATE OF DISCHARGE:                               OPERATIVE REPORT   PREOPERATIVE DIAGNOSIS:  Left knee medial meniscus tear.   POSTOPERATIVE DIAGNOSIS:  Left knee medial greater than lateral meniscus  tear.  Grade 2 mild grade 3 chondral changes medial femoral condyle.   PROCEDURE:  Left knee exam under anesthesia, arthroscopy, partial medial  and lateral meniscectomy.  Chondroplasty medial femoral condyle.   SURGEON:  Loreta Ave, M.D.   ASSISTANT:  Zonia Kief, PA   ANESTHESIA:  Knee block with sedation.   SPECIMENS:  None.   CULTURES:  None.   COMPLICATIONS:  None.   DRESSING:  Soft compressive.   PROCEDURE:  The patient was brought to the operating room, placed on the  operating table in supine position.  After adequate anesthesia had been  obtained, left knee examined.  Full motion, good stability.  Tourniquet  and leg holder applied.  Leg prepped and draped in the usual sterile  fashion.  Three portals created, one superolateral, one each medial and  lateral parapatellar.  Inflow catheter induced and the knee distended.  The arthroscope was introduced and the knee inspected.  Articular  cartilage intact except for some grade 2 mild grade 3 changes posterior  aspect medial femoral condyle.  Chondroplasty to a stable surface.  Medial meniscus complex tearing posterior half with fragments flipped  over on top of one another.  Lateral meniscus radial tears, middle and  posterior third.  Most of the posterior medial meniscus taken out,  tapered smoothly.  The lateral meniscus saucerized out all the way  around to a smooth border.  Cruciate ligaments intact.  Entire knee  examined.  No other  findings appreciated.  Instruments and fluid removed.  Portals in the knee injected with  Marcaine.  Portals closed with 4-0 nylon.  Sterile compressive dressing  applied.  Anesthesia reversed.  Brought to the room.  Tolerated surgery  well.  No complications.      Loreta Ave, M.D.  Electronically Signed     DFM/MEDQ  D:  03/21/2007  T:  03/21/2007  Job:  [id number]

## 2011-04-13 ENCOUNTER — Telehealth: Payer: Self-pay | Admitting: *Deleted

## 2011-04-13 MED ORDER — ESCITALOPRAM OXALATE 10 MG PO TABS: 10.0000 mg | ORAL_TABLET | Freq: Every day | ORAL | Status: DC

## 2011-04-13 MED ORDER — AMLODIPINE BESYLATE 10 MG PO TABS: 10.0000 mg | ORAL_TABLET | Freq: Every day | ORAL | Status: DC

## 2011-04-13 NOTE — Telephone Encounter (Signed)
rx sent to pharmacy

## 2011-07-19 LAB — I-STAT 8, (EC8 V) (CONVERTED LAB)
HCT: 43
Hemoglobin: 14.6
Potassium: 3.5
Sodium: 140
TCO2: 26

## 2011-08-10 ENCOUNTER — Telehealth: Payer: Self-pay

## 2011-08-10 NOTE — Telephone Encounter (Signed)
Pt called and stated he has a cough, headache, and feels weak.  Pt aware that Dr. Fabian Sharp is out for the remainder of the day.  Offered to give pt an appt on 08/11/11 at 10:45 but pt states he has to drive to [place].  Pt given the choice to call back an make an appt for [facility] but pt states he has an appt on 08/14/11 and he will "tough it out" until then.  Pt aware that if he continues to get worse after hours to go to Urgent Care or the ER.

## 2011-08-11 ENCOUNTER — Telehealth: Payer: Self-pay | Admitting: Internal Medicine

## 2011-08-11 NOTE — Telephone Encounter (Signed)
Pt called back and said that he is still cough and has chest congestion. Pt has decided to go to [facility] and has for 08/12/11 at 9am.

## 2011-08-12 ENCOUNTER — Ambulatory Visit: Payer: Federal, State, Local not specified - PPO | Admitting: Internal Medicine

## 2011-08-14 ENCOUNTER — Ambulatory Visit (INDEPENDENT_AMBULATORY_CARE_PROVIDER_SITE_OTHER): Payer: Federal, State, Local not specified - PPO | Admitting: Internal Medicine

## 2011-08-14 ENCOUNTER — Encounter: Payer: Self-pay | Admitting: Internal Medicine

## 2011-08-14 VITALS — BP 130/80 | HR 72 | Ht 68.0 in | Wt 213.0 lb

## 2011-08-14 DIAGNOSIS — R7309 Other abnormal glucose: Secondary | ICD-10-CM

## 2011-08-14 DIAGNOSIS — I10 Essential (primary) hypertension: Secondary | ICD-10-CM

## 2011-08-14 DIAGNOSIS — Z0000 Encounter for general adult medical examination without abnormal findings: Secondary | ICD-10-CM | POA: Insufficient documentation

## 2011-08-14 DIAGNOSIS — K219 Gastro-esophageal reflux disease without esophagitis: Secondary | ICD-10-CM

## 2011-08-14 DIAGNOSIS — R0989 Other specified symptoms and signs involving the circulatory and respiratory systems: Secondary | ICD-10-CM

## 2011-08-14 DIAGNOSIS — E669 Obesity, unspecified: Secondary | ICD-10-CM

## 2011-08-14 DIAGNOSIS — R5381 Other malaise: Secondary | ICD-10-CM

## 2011-08-14 DIAGNOSIS — Z23 Encounter for immunization: Secondary | ICD-10-CM

## 2011-08-14 DIAGNOSIS — R05 Cough: Secondary | ICD-10-CM

## 2011-08-14 DIAGNOSIS — R0609 Other forms of dyspnea: Secondary | ICD-10-CM

## 2011-08-14 DIAGNOSIS — F329 Major depressive disorder, single episode, unspecified: Secondary | ICD-10-CM

## 2011-08-14 DIAGNOSIS — R059 Cough, unspecified: Secondary | ICD-10-CM

## 2011-08-14 DIAGNOSIS — R5383 Other fatigue: Secondary | ICD-10-CM

## 2011-08-14 MED ORDER — ESOMEPRAZOLE MAGNESIUM 40 MG PO CPDR: 40.0000 mg | DELAYED_RELEASE_CAPSULE | Freq: Every day | ORAL | Status: DC

## 2011-08-14 MED ORDER — ESCITALOPRAM OXALATE 10 MG PO TABS: 10.0000 mg | ORAL_TABLET | Freq: Every day | ORAL | Status: DC

## 2011-08-14 MED ORDER — AMLODIPINE BESYLATE 10 MG PO TABS: 10.0000 mg | ORAL_TABLET | Freq: Every day | ORAL | Status: DC

## 2011-08-14 MED ORDER — BENZONATATE 100 MG PO CAPS: 100.0000 mg | ORAL_CAPSULE | Freq: Three times a day (TID) | ORAL | Status: AC | PRN

## 2011-08-14 MED ORDER — VALSARTAN-HYDROCHLOROTHIAZIDE 320-25 MG PO TABS: 1.0000 | ORAL_TABLET | Freq: Every day | ORAL | Status: DC

## 2011-08-14 NOTE — Patient Instructions (Signed)
Cough med as needed Expect cough for another 2 weeks. Will notify you  of labs when available. Then decode on the potassium If the  Tremor  Progresses we can get opnion from  Neuro.

## 2011-08-14 NOTE — Progress Notes (Signed)
Subjective:    Patient ID: Jaime Orr, male    DOB: Nov 06, 1950, 60 y.o.   MRN: [id number]  HPI Patient comes in today for preventive visit and follow-up of medical issues. Update of her history since her last visit. 1 Week of cough and bad spasms.  But no cp sob of fever . No hemoptysis  HT good today  On meds. No problems  Ran out of potassium supplements  About 6 months.  Ocass tobacco EToh less with illness.  Should cut down. GERD:  Med  Helps  Needs  refills Lexapro helps mood situation and anxiety gets worse.    Wants to continue  Review of Systems ROS:  GEN/ HEENTNo fever, significant weight changes sweats headaches vision problems hearing changes, CV/ PULM; No chest pain shortness of breath cough, syncope,edema  change in exercise tolerance. GI /GU: No adominal pain, vomiting, change in bowel habits. No blood in the stool. No significant GU symptoms.  Some ed  SKIN/HEME: ,no acute skin rashes suspicious lesions or bleeding. No lymphadenopathy, nodules, masses.  NEURO/ PSYCH:  No  weakness numbness No depression anxiety. hasa shking left fingers when holding and holding fingers together no change in grip  IMM/ Allergy: No unusual infections.  Allergy .   Snores with etoh otherwise ok REST of 12 system review negative Outpatient Encounter Prescriptions as of 08/14/2011  Medication Sig Dispense Refill  . amLODipine (NORVASC) 10 MG tablet Take 1 tablet (10 mg total) by mouth daily.  90 tablet  3  . escitalopram (LEXAPRO) 10 MG tablet Take 1 tablet (10 mg total) by mouth daily.  90 tablet  3  . esomeprazole (NEXIUM) 40 MG capsule Take 1 capsule (40 mg total) by mouth daily before breakfast.  90 capsule  3  . valsartan-hydrochlorothiazide (DIOVAN-HCT) 320-25 MG per tablet Take 1 tablet by mouth daily.  90 tablet  3  . DISCONTD: amLODipine (NORVASC) 10 MG tablet Take 1 tablet (10 mg total) by mouth daily.  90 tablet  2  . DISCONTD: escitalopram (LEXAPRO) 10 MG tablet Take 1  tablet (10 mg total) by mouth daily.  90 tablet  2  . DISCONTD: esomeprazole (NEXIUM) 40 MG capsule Take 40 mg by mouth every morning before breakfast.        . DISCONTD: valsartan-hydrochlorothiazide (DIOVAN-HCT) 320-25 MG per tablet Take 1 tablet by mouth daily.  90 tablet  0  . benzonatate (TESSALON PERLES) 100 MG capsule Take 1 capsule (100 mg total) by mouth 3 (three) times daily as needed for cough.  20 capsule  1  . potassium chloride SA (K-DUR,KLOR-CON) 20 MEQ tablet Take 1 tablet (20 mEq total) by mouth 2 (two) times daily.  180 tablet  3  . DISCONTD: meloxicam (MOBIC) 15 MG tablet Take 1 tablet (15 mg total) by mouth daily.  30 tablet  1   Past history family history social history reviewed in the electronic medical record.     Objective:   Physical Exam Wt Readings from Last 3 Encounters:  08/14/11 213 lb (96.616 kg)  02/13/11 218 lb (98.884 kg)  01/31/11 215 lb (97.523 kg)  Physical Exam: Vital signs reviewed WUJ:WJXB is a well-developed well-nourished alert cooperative  White male  who appears   stated age in no acute distress.   Mild congesetion  HEENT: normocephalic  traumatic , Eyes: PERRL EOM's full, conjunctiva clear, Nares: patent no deformity discharge or tenderness., Ears: no deformity EAC's clear TMs with normal landmarks. Mouth: clear OP, no lesions,  edema.  Moist mucous membranes. Dentition in adequate repair. NECK: supple without masses, thyromegaly or bruits. CHEST/PULM:  Clear to auscultation and percussion breath sounds equal no wheeze , rales or rhonchi. No chest wall deformities or tenderness. CV: PMI is nondisplaced, S1 S2 no gallops, murmurs, rubs. Peripheral pulses are full without delay.No JVD .  ABDOMEN: Bowel sounds normal nontender  No guard or rebound, no hepato splenomegal no CVA tenderness.  No hernia. Extremtities:  No clubbing cyanosis or edema, no acute joint swelling or redness no focal atrophy NEURO:  Oriented x3, cranial nerves 3-12 appear to be  intact, no obvious focal weakness,gait within normal limits no abnormal reflexes or asymmetrical  Left hand if extends and  Appose fingers with slight termor no other time no cogwheeling  SKIN: No acute rashes normal turgor, color, no bruising or petechiae.  Old scarsa sun changes  PSYCH: Oriented, good eye contact, no obvious depression anxiety, cognition and judgment appear normal. Rectal declined LN:  No cervical axillary or inguinal adenopathy     Assessment & Plan:  Preventive Health Care Counseled regarding healthy nutrition, exercise, sleep, injury prevention, calcium vit d and healthy weight . Lab today Disc dec weight and moderation etoh etc.   HT GERD  Taking and doses well.  Off k for 6 months will decide after labs done Mood  Good on lexapro will continue ED  No samples witll call for  Rx if needed. Cough seems viral rti  Alarm sx reviewed and call if  persistent or progressive etc.  Flu shot today aok. tremor only under certain situation and no other sx   If  persistent or progressive then call and recheck consider further evaluation

## 2011-08-15 LAB — BASIC METABOLIC PANEL
Calcium: 8.5 mg/dL (ref 8.4–10.5)
GFR: 78.8 mL/min (ref >60.00)
Sodium: 142 mEq/L (ref 135–145)

## 2011-08-15 LAB — CBC WITH DIFFERENTIAL/PLATELET
Basophils Absolute: 0 10*3/uL (ref 0.0–0.1)
Basophils Relative: 0.8 % (ref 0.0–3.0)
Eosinophils Relative: 4.9 % (ref 0.0–5.0)
Hemoglobin: 11.8 g/dL — ABNORMAL LOW (ref 13.0–17.0)
Lymphocytes Relative: 24.7 % (ref 12.0–46.0)
Monocytes Relative: 8.6 % (ref 3.0–12.0)
Neutro Abs: 3.8 10*3/uL (ref 1.4–7.7)
RBC: 4.61 Mil/uL (ref 4.22–5.81)
RDW: 15.4 % — ABNORMAL HIGH (ref 11.5–14.6)
WBC: 6.2 10*3/uL (ref 4.5–10.5)

## 2011-08-15 LAB — HEPATIC FUNCTION PANEL
AST: 22 U/L (ref 0–37)
Albumin: 3.5 g/dL (ref 3.5–5.2)
Alkaline Phosphatase: 60 U/L (ref 39–117)

## 2011-08-15 LAB — LIPID PANEL: VLDL: 22.6 mg/dL (ref 0.0–40.0)

## 2011-08-15 LAB — TSH: TSH: 2.25 u[IU]/mL (ref 0.35–5.50)

## 2011-08-15 LAB — TESTOSTERONE: Testosterone: 259.89 ng/dL — ABNORMAL LOW (ref 350.00–890.00)

## 2011-08-22 ENCOUNTER — Telehealth: Payer: Self-pay | Admitting: *Deleted

## 2011-08-22 DIAGNOSIS — R7989 Other specified abnormal findings of blood chemistry: Secondary | ICD-10-CM

## 2011-08-22 NOTE — Telephone Encounter (Signed)
Pt wants to go on testosterone therapy.

## 2011-08-23 MED ORDER — TESTOSTERONE 30 MG/ACT TD SOLN: 1.0000 "application " | Freq: Once | TRANSDERMAL | Status: DC

## 2011-08-23 NOTE — Telephone Encounter (Signed)
axiron 1 pump each axilla  q d   Refill x 3 disp one bottle( coupon and copay card)  Then level testosterone in 3 months and ROV.

## 2011-08-23 NOTE — Telephone Encounter (Signed)
Left message on pt's phone and with wife.

## 2011-08-31 ENCOUNTER — Telehealth: Payer: Self-pay

## 2011-08-31 ENCOUNTER — Ambulatory Visit (INDEPENDENT_AMBULATORY_CARE_PROVIDER_SITE_OTHER)
Admission: RE | Admit: 2011-08-31 | Discharge: 2011-08-31 | Disposition: A | Discharge status: Home / Self Care | Payer: Federal, State, Local not specified - PPO | Source: Ambulatory Visit | Attending: Internal Medicine | Admitting: Internal Medicine

## 2011-08-31 DIAGNOSIS — R059 Cough, unspecified: Secondary | ICD-10-CM

## 2011-08-31 DIAGNOSIS — R05 Cough: Secondary | ICD-10-CM

## 2011-08-31 NOTE — Telephone Encounter (Signed)
Spoke with patient and he agreed to the x-ray. Order sent.

## 2011-08-31 NOTE — Telephone Encounter (Signed)
Per Dr. Fabian Sharp- Antibiotics won't help cough could be from pneumonia or sinusitis. Sometimes Asthma meds can help. If concerned get cxr and then ov to evaluate

## 2011-08-31 NOTE — Telephone Encounter (Signed)
Pt called and stated he was in a couple of weeks ago with a cough and no antibiotics were given.  Pt states this is his 3rd week and he is still coughing, Pt denies having a fever.  Pls advise.

## 2011-09-05 NOTE — Progress Notes (Signed)
Quick Note:  Pt aware of results. ______ 

## 2011-12-04 ENCOUNTER — Encounter: Payer: Self-pay | Admitting: Internal Medicine

## 2011-12-04 ENCOUNTER — Ambulatory Visit (INDEPENDENT_AMBULATORY_CARE_PROVIDER_SITE_OTHER): Payer: Federal, State, Local not specified - PPO | Admitting: Internal Medicine

## 2011-12-04 VITALS — BP 120/70 | HR 78 | Temp 98.8°F | Wt 221.0 lb

## 2011-12-04 DIAGNOSIS — I10 Essential (primary) hypertension: Secondary | ICD-10-CM

## 2011-12-04 DIAGNOSIS — K219 Gastro-esophageal reflux disease without esophagitis: Secondary | ICD-10-CM

## 2011-12-04 DIAGNOSIS — R0989 Other specified symptoms and signs involving the circulatory and respiratory systems: Secondary | ICD-10-CM | POA: Insufficient documentation

## 2011-12-04 DIAGNOSIS — G8929 Other chronic pain: Secondary | ICD-10-CM | POA: Insufficient documentation

## 2011-12-04 DIAGNOSIS — R070 Pain in throat: Secondary | ICD-10-CM

## 2011-12-04 DIAGNOSIS — E291 Testicular hypofunction: Secondary | ICD-10-CM

## 2011-12-04 DIAGNOSIS — R6889 Other general symptoms and signs: Secondary | ICD-10-CM

## 2011-12-04 MED ORDER — FLUTICASONE PROPIONATE 50 MCG/ACT NA SUSP: 2.0000 | Freq: Every day | NASAL | Status: DC

## 2011-12-04 MED ORDER — POTASSIUM CHLORIDE CRYS ER 20 MEQ PO TBCR: 20.0000 meq | EXTENDED_RELEASE_TABLET | Freq: Two times a day (BID) | ORAL | Status: DC

## 2011-12-04 NOTE — Progress Notes (Signed)
Subjective:    Patient ID: Jaime Orr, male    DOB: 08-07-1951, 61 y.o.   MRN: [id number]  HPI Patient comes in today for SDA for  new problem evaluation.  Raw throat and feeling parched since  NOvember using sinus  otc causing drying out.   So  No meds for now.  No cough  NO fever face pain   . ? If drainage  Worse with ? Nothing  No help with antihistamines . No globus  Sx.  No ets tobacco . Asthma sx.  Some heart burn  About 1 week.   Break through on nexium.   Taking before a meal.  No sig change has been on a ppi since ? 1994 or so  Ma Rings out of otassium  So not taking  Last k was 3.5 range  On meds   Testosterone  Helping  Due for labs in a few months.  Review of Systems Neg cp sob  Syncope weight loss. No NVD no bleeding  No weight  loss sob.  Past history family history social history reviewed in the electronic medical record.  Outpatient Prescriptions Prior to Visit  Medication Sig Dispense Refill  . amLODipine (NORVASC) 10 MG tablet Take 1 tablet (10 mg total) by mouth daily.  90 tablet  3  . escitalopram (LEXAPRO) 10 MG tablet Take 1 tablet (10 mg total) by mouth daily.  90 tablet  3  . esomeprazole (NEXIUM) 40 MG capsule Take 1 capsule (40 mg total) by mouth daily before breakfast.  90 capsule  3  . Testosterone (AXIRON) 30 MG/ACT SOLN Place 1 application onto the skin once. Apply 1 pump to each axilla daily (60mg  a day)  90 mL  3  . valsartan-hydrochlorothiazide (DIOVAN-HCT) 320-25 MG per tablet Take 1 tablet by mouth daily.  90 tablet  3  . potassium chloride SA (K-DUR,KLOR-CON) 20 MEQ tablet Take 1 tablet (20 mEq total) by mouth 2 (two) times daily.  180 tablet  3        Objective:   Physical Exam wdwn in nad  HEENT: Normocephalic ;atraumatic , Eyes;  PERRL, EOMs  Full, lids and conjunctiva clear,,Ears: no deformities, canals nl, TM landmarks normal, Nose: no  discharge  Mouth : OP   Cobblestoning post OP but no obv lesion  Mild redness  No ulcers or edema  , Neck: Supple without adenopathy or masses or bruits points to mid tracheal are of discomfort .  ocass throat clearing   Chest:  Clear to A&P without wheezes rales or rhonchi CV:  S1-S2 no gallops or murmurs peripheral perfusion is normal Looks well    Chemistry      Component Value Date/Time   NA 142 08/14/2011 1052   K 3.7 08/14/2011 1052   CL 106 08/14/2011 1052   CO2 31 08/14/2011 1052   BUN 15 08/14/2011 1052   CREATININE 1.0 08/14/2011 1052      Component Value Date/Time   CALCIUM 8.5 08/14/2011 1052   ALKPHOS 60 08/14/2011 1052   AST 22 08/14/2011 1052   ALT 25 08/14/2011 1052   BILITOT 0.7 08/14/2011 1052         Assessment & Plan:  Throat pain with throat clearing  For 4 months non responsive to otc antihistamines  . No sig face pain or nasal drainage  But loss PND   Vs  GI source?  Needs thorough ENT evaluation  As to cause and intervention.  GERD on ppi for years  Had UGI never had endo and may need UGD  .   HT  Reviewed  record  k went borderline low  And normal with K in the past . Will restart and check bmp at testosterone check.  Hypogonadism feels better on replacement so far .

## 2011-12-04 NOTE — Patient Instructions (Signed)
We will  Refer you to see a ENT doctor to get a good look at   Your throat and vocal chords.  It is possible that  You have reflux causing the throat clearing.   In the meantime add flonase   Every day.  You may need to see Gi for an upper endoscopy be cause of the long standing reflux.

## 2012-01-02 ENCOUNTER — Telehealth: Payer: Self-pay | Admitting: Internal Medicine

## 2012-01-02 NOTE — Telephone Encounter (Signed)
Rx last filled 08/23/11; Pt last seen 12/04/11.  Pls advise.

## 2012-01-02 NOTE — Telephone Encounter (Signed)
Ok to do this x 1

## 2012-01-02 NOTE — Telephone Encounter (Signed)
Patient called stating that he need a 90 day refill on axiron sent to [facility] [address]. Please assist.

## 2012-01-03 MED ORDER — TESTOSTERONE 30 MG/ACT TD SOLN: 1.0000 "application " | Freq: Once | TRANSDERMAL | Status: DC

## 2012-01-03 NOTE — Telephone Encounter (Signed)
Rx called in to [facility] [address].  

## 2012-01-03 NOTE — Telephone Encounter (Signed)
Addended by: Azucena Freed on: 01/03/2012 05:07 PM   Modules accepted: Orders

## 2012-02-09 ENCOUNTER — Other Ambulatory Visit: Payer: Self-pay | Admitting: *Deleted

## 2012-02-09 ENCOUNTER — Encounter: Payer: Self-pay | Admitting: Internal Medicine

## 2012-02-09 ENCOUNTER — Ambulatory Visit (INDEPENDENT_AMBULATORY_CARE_PROVIDER_SITE_OTHER): Payer: Federal, State, Local not specified - PPO | Admitting: Internal Medicine

## 2012-02-09 ENCOUNTER — Other Ambulatory Visit: Payer: Self-pay | Admitting: Internal Medicine

## 2012-02-09 DIAGNOSIS — Z7289 Other problems related to lifestyle: Secondary | ICD-10-CM

## 2012-02-09 DIAGNOSIS — D649 Anemia, unspecified: Secondary | ICD-10-CM

## 2012-02-09 DIAGNOSIS — D509 Iron deficiency anemia, unspecified: Secondary | ICD-10-CM

## 2012-02-09 DIAGNOSIS — R7989 Other specified abnormal findings of blood chemistry: Secondary | ICD-10-CM | POA: Insufficient documentation

## 2012-02-09 DIAGNOSIS — I10 Essential (primary) hypertension: Secondary | ICD-10-CM

## 2012-02-09 DIAGNOSIS — E291 Testicular hypofunction: Secondary | ICD-10-CM

## 2012-02-09 LAB — CBC WITH DIFFERENTIAL/PLATELET
Eosinophils Relative: 5.6 % — ABNORMAL HIGH (ref 0.0–5.0)
HCT: 34.2 % — ABNORMAL LOW (ref 39.0–52.0)
Lymphocytes Relative: 32 % (ref 12.0–46.0)
Monocytes Relative: 11.7 % (ref 3.0–12.0)
Neutrophils Relative %: 49.8 % (ref 43.0–77.0)
Platelets: 335 10*3/uL (ref 150.0–400.0)
WBC: 5.2 10*3/uL (ref 4.5–10.5)

## 2012-02-09 LAB — BASIC METABOLIC PANEL
BUN: 19 mg/dL (ref 6–23)
Creatinine, Ser: 1.1 mg/dL (ref 0.4–1.5)
GFR: 74.48 mL/min (ref >60.00)
Potassium: 3.7 mEq/L (ref 3.5–5.1)

## 2012-02-09 LAB — VITAMIN B12: Vitamin B-12: 228 pg/mL (ref 211–911)

## 2012-02-09 LAB — FERRITIN: Ferritin: 3.2 ng/mL — ABNORMAL LOW (ref 22.0–322.0)

## 2012-02-09 MED ORDER — TESTOSTERONE 30 MG/ACT TD SOLN: 1.0000 "application " | Freq: Every day | TRANSDERMAL | Status: DC

## 2012-02-09 NOTE — Progress Notes (Signed)
  Subjective:    Patient ID: Jaime Orr, male    DOB: [birth year], 61 y.o.   MRN: [id number]  HPI Patient comes in today for follow up of  multiple medical problems.  Testosteron on axiron and sems to feel better  More energy and sex function. No se noted ETOH still reg  Stress in family but denes depressive sx with this. MOther in law is ill BP taking me no se. Allergy stable  Didn't get labs yet  Throat no worse not currently concerned  Check anemia  Almost denied last blood donation  Gives about every 8  Weeks  No bleeding otherwise  Review of Systems Neg fever cp sob  Has some ed asks about meds .  No acute gu sx.  Past history family history social history reviewed in the electronic medical record. Outpatient Encounter Prescriptions as of 02/09/2012  Medication Sig Dispense Refill  . amLODipine (NORVASC) 10 MG tablet Take 1 tablet (10 mg total) by mouth daily.  90 tablet  3  . escitalopram (LEXAPRO) 10 MG tablet Take 1 tablet (10 mg total) by mouth daily.  90 tablet  3  . esomeprazole (NEXIUM) 40 MG capsule Take 1 capsule (40 mg total) by mouth daily before breakfast.  90 capsule  3  . potassium chloride SA (K-DUR,KLOR-CON) 20 MEQ tablet Take 1 tablet (20 mEq total) by mouth 2 (two) times daily.  180 tablet  1  . valsartan-hydrochlorothiazide (DIOVAN-HCT) 320-25 MG per tablet Take 1 tablet by mouth daily.  90 tablet  3  . DISCONTD: Testosterone (AXIRON) 30 MG/ACT SOLN Place 1 application onto the skin once. Apply 1 pump to each axilla daily (60mg  a day)  90 mL  0  . DISCONTD: Testosterone (AXIRON) 30 MG/ACT SOLN Place 1 application onto the skin daily.       . fluticasone (FLONASE) 50 MCG/ACT nasal spray Place 2 sprays into the nose daily.  16 g  6       Objective:   Physical Exam BP 138/70  Pulse 82  Temp(Src) 99 F (37.2 C) (Oral)  Wt 219 lb (99.338 kg)  SpO2 96% WDWN in nad Repeat bp right large 140/70 rr Neck: Supple without adenopathy or masses or bruits Chest:   Clear to A without wheezes rales or rhonchi CV:  S1-S2 no gallops or murmurs peripheral perfusion is normal Oriented x 3. Normal cognition, attention, speech. Not anxious or depressed appearing   Good eye contact .  Labs last reviewed     Assessment & Plan:   Low testosterone with sx seemingly better clincially on replacement. Weight loss and etoh limiting will also help. HT  Borderline systolic today will not change meds but Intensify lifestyle interventions. etoh  Counseled.  No other alarm issues. Anemia felt to be from donation  Will receck per pt request  May need to go to every 12 week donation . ED check on formulary  cilais and viagra  And we can call in rx .  Look for samples   . cilais 20 pt to be called back to pick up to try.   Due for labs  Today test bmp add cbc ferritin and b12  Plan fu as ordered or as per lab results  And refill medication  EHR  Is locked and I am unable to  signed orders and IT  Team is working on it  .  meds called in by nursing .

## 2012-02-09 NOTE — Patient Instructions (Signed)
We'll itching no about laboratory results. Moderate alcohol intake as discussed . Alcohol can contribute to other medical issues as we discussed.  Call us about which medication such as Viagra Cialis you wish [place] to prescribe.  Check her blood pressure readings occasionally and make sure they are below 140/90. Otherwise we'll do CPX in 6 months with fasting labs to include testosterone level.

## 2012-02-09 NOTE — Assessment & Plan Note (Signed)
Recommend go to every 12 week blood in urination may have a hard time keeping up.

## 2012-02-10 ENCOUNTER — Encounter: Payer: Self-pay | Admitting: Internal Medicine

## 2012-02-10 DIAGNOSIS — D649 Anemia, unspecified: Secondary | ICD-10-CM | POA: Insufficient documentation

## 2012-02-14 ENCOUNTER — Other Ambulatory Visit: Payer: Self-pay | Admitting: Internal Medicine

## 2012-02-14 DIAGNOSIS — D649 Anemia, unspecified: Secondary | ICD-10-CM

## 2012-02-14 NOTE — Progress Notes (Signed)
Quick Note:  Left a message for pt to return call. ______ 

## 2012-02-14 NOTE — Progress Notes (Signed)
Quick Note:  Spoke with pt- informed of Results and dr. Rosezella [place] instructions - lab order placed for future labs ______

## 2012-02-27 ENCOUNTER — Ambulatory Visit: Payer: Federal, State, Local not specified - PPO | Admitting: Internal Medicine

## 2012-03-04 ENCOUNTER — Other Ambulatory Visit: Payer: Self-pay

## 2012-03-04 NOTE — Telephone Encounter (Signed)
Ok but which dose ?  10mg  or 20 mg   As needed ? Or  Daily dose

## 2012-03-04 NOTE — Telephone Encounter (Signed)
[facility] is requesting a new rx for cialis in order for pt to get 30 day trial.  Pls advise.  Pt last seen 02/09/12.

## 2012-03-05 ENCOUNTER — Telehealth: Payer: Self-pay | Admitting: Family Medicine

## 2012-03-05 MED ORDER — TADALAFIL 5 MG PO TABS: 5.0000 mg | ORAL_TABLET | Freq: Every day | ORAL | Status: DC | PRN

## 2012-03-05 NOTE — Telephone Encounter (Signed)
Jaime Orr/Dr. Fabian Sharp,  Cialis is excluded on the formulary for this pt's insurance for the treatment of ED. Is covered with prior auth for BPH only. Pt needs to call his insurance and find out what they WILL cover for ED. Thanks.

## 2012-03-05 NOTE — Telephone Encounter (Signed)
Rx sent to pharmacy for cialis 5 mg.

## 2012-03-07 MED ORDER — TADALAFIL 20 MG PO TABS: 10.0000 mg | ORAL_TABLET | ORAL | Status: DC | PRN

## 2012-03-07 NOTE — Telephone Encounter (Signed)
[facility] and the pharmacy states that the coupon is not accepted through pt's insurance.  Pt states he will call his insurance to see what his medication needs to be changed to.  Pt states he just wants to get the free sample.

## 2012-03-07 NOTE — Telephone Encounter (Signed)
Left a message for pt to return call 

## 2012-03-14 ENCOUNTER — Telehealth: Payer: Self-pay | Admitting: Family Medicine

## 2012-03-14 NOTE — Telephone Encounter (Signed)
Ok to print rx  To take 5 mg 1 po qd disp 30 cialis for him,  confiirm that that is the right request.

## 2012-03-14 NOTE — Telephone Encounter (Signed)
I spoke to this pt.  He was given a card for a free sample of Cialis 5mg .  However, he has to have a script in order to pick it up at the pharmacy.  I also need to authorize this through his insurance.  Okay to print script for your signature?

## 2012-03-18 NOTE — Telephone Encounter (Signed)
Contacted the insurance company.  Proceeded with prior authorization.  Denied because the pt does not have BPH.  The pt was notified of the outcome.

## 2012-04-09 ENCOUNTER — Telehealth: Payer: Self-pay | Admitting: Internal Medicine

## 2012-04-09 NOTE — Telephone Encounter (Signed)
*  Voicemail*  Pt states he thinks he is due for labs to have iron and b12 check.  Please check to see if patient is due for follow up labs and call him back to inform him.

## 2012-04-10 ENCOUNTER — Other Ambulatory Visit: Payer: Self-pay | Admitting: Family Medicine

## 2012-04-10 ENCOUNTER — Other Ambulatory Visit (INDEPENDENT_AMBULATORY_CARE_PROVIDER_SITE_OTHER): Payer: Federal, State, Local not specified - PPO

## 2012-04-10 DIAGNOSIS — D649 Anemia, unspecified: Secondary | ICD-10-CM

## 2012-04-10 DIAGNOSIS — E611 Iron deficiency: Secondary | ICD-10-CM

## 2012-04-10 DIAGNOSIS — D509 Iron deficiency anemia, unspecified: Secondary | ICD-10-CM

## 2012-04-10 LAB — CBC WITH DIFFERENTIAL/PLATELET
Basophils Absolute: 0.1 10*3/uL (ref 0.0–0.1)
Basophils Relative: 0.9 % (ref 0.0–3.0)
Eosinophils Relative: 2.9 % (ref 0.0–5.0)
HCT: 35.8 % — ABNORMAL LOW (ref 39.0–52.0)
Hemoglobin: 11.4 g/dL — ABNORMAL LOW (ref 13.0–17.0)
Lymphs Abs: 1.8 10*3/uL (ref 0.7–4.0)
Monocytes Relative: 8.5 % (ref 3.0–12.0)
Neutro Abs: 3.7 10*3/uL (ref 1.4–7.7)
RBC: 4.99 Mil/uL (ref 4.22–5.81)
RDW: 20.8 % — ABNORMAL HIGH (ref 11.5–14.6)

## 2012-04-10 NOTE — Telephone Encounter (Signed)
Pt notified that his labs are due and that he needs to make an appt.  He is currently working and will call back to make the appt.

## 2012-04-12 ENCOUNTER — Ambulatory Visit: Payer: Federal, State, Local not specified - PPO | Admitting: Internal Medicine

## 2012-04-22 ENCOUNTER — Other Ambulatory Visit: Payer: Self-pay | Admitting: Family Medicine

## 2012-05-22 ENCOUNTER — Encounter: Payer: Self-pay | Admitting: Family Medicine

## 2012-05-22 ENCOUNTER — Ambulatory Visit (INDEPENDENT_AMBULATORY_CARE_PROVIDER_SITE_OTHER): Payer: Federal, State, Local not specified - PPO | Admitting: Family Medicine

## 2012-05-22 VITALS — BP 136/90 | HR 72 | Temp 99.9°F | Wt 217.0 lb

## 2012-05-22 DIAGNOSIS — J329 Chronic sinusitis, unspecified: Secondary | ICD-10-CM

## 2012-05-22 MED ORDER — AZITHROMYCIN 250 MG PO TABS: ORAL_TABLET | ORAL | Status: AC

## 2012-05-22 MED ORDER — HYDROCODONE-HOMATROPINE 5-1.5 MG/5ML PO SYRP: 5.0000 mL | ORAL_SOLUTION | ORAL | Status: AC | PRN

## 2012-05-22 NOTE — Progress Notes (Signed)
  Subjective:    Patient ID: Jaime Orr, male    DOB: July 20, 1951, 62 y.o.   MRN: [id number]  HPI Here for 2 days of sinus pressure, PND, ST, and a dry cough.    Review of Systems  Constitutional: Negative.   HENT: Positive for congestion, postnasal drip and sinus pressure.   Eyes: Negative.   Respiratory: Positive for cough.        Objective:   Physical Exam  Constitutional: He appears well-developed and well-nourished.  HENT:  Right Ear: External ear normal.  Left Ear: External ear normal.  Mouth/Throat: Oropharynx is clear and moist.  Eyes: Conjunctivae are normal.  Pulmonary/Chest: Effort normal and breath sounds normal.  Lymphadenopathy:    He has no cervical adenopathy.          Assessment & Plan:  Add Mucinex D

## 2012-07-03 ENCOUNTER — Other Ambulatory Visit: Payer: Self-pay | Admitting: Family Medicine

## 2012-07-03 MED ORDER — POTASSIUM CHLORIDE CRYS ER 20 MEQ PO TBCR: 20.0000 meq | EXTENDED_RELEASE_TABLET | Freq: Two times a day (BID) | ORAL | Status: DC

## 2012-07-03 MED ORDER — ESCITALOPRAM OXALATE 10 MG PO TABS: 10.0000 mg | ORAL_TABLET | Freq: Every day | ORAL | Status: DC

## 2012-07-03 MED ORDER — VALSARTAN-HYDROCHLOROTHIAZIDE 320-25 MG PO TABS: 1.0000 | ORAL_TABLET | Freq: Every day | ORAL | Status: DC

## 2012-07-03 MED ORDER — AMLODIPINE BESYLATE 10 MG PO TABS: 10.0000 mg | ORAL_TABLET | Freq: Every day | ORAL | Status: DC

## 2012-07-04 ENCOUNTER — Other Ambulatory Visit: Payer: Self-pay | Admitting: Family Medicine

## 2012-07-29 ENCOUNTER — Other Ambulatory Visit (INDEPENDENT_AMBULATORY_CARE_PROVIDER_SITE_OTHER): Payer: Federal, State, Local not specified - PPO

## 2012-07-29 DIAGNOSIS — R7989 Other specified abnormal findings of blood chemistry: Secondary | ICD-10-CM

## 2012-07-29 DIAGNOSIS — E291 Testicular hypofunction: Secondary | ICD-10-CM

## 2012-07-29 DIAGNOSIS — Z0000 Encounter for general adult medical examination without abnormal findings: Secondary | ICD-10-CM

## 2012-07-29 DIAGNOSIS — D649 Anemia, unspecified: Secondary | ICD-10-CM

## 2012-07-29 DIAGNOSIS — I10 Essential (primary) hypertension: Secondary | ICD-10-CM

## 2012-07-29 LAB — VITAMIN B12: Vitamin B-12: 299 pg/mL (ref 211–911)

## 2012-07-29 LAB — BASIC METABOLIC PANEL
BUN: 25 mg/dL — ABNORMAL HIGH (ref 6–23)
CO2: 32 mEq/L (ref 19–32)
Chloride: 105 mEq/L (ref 96–112)
Glucose, Bld: 107 mg/dL — ABNORMAL HIGH (ref 70–99)
Potassium: 4.1 mEq/L (ref 3.5–5.1)

## 2012-07-29 LAB — POCT URINALYSIS DIPSTICK
Bilirubin, UA: NEGATIVE
Ketones, UA: NEGATIVE
Leukocytes, UA: NEGATIVE

## 2012-07-29 LAB — CBC WITH DIFFERENTIAL/PLATELET
Eosinophils Relative: 4.4 % (ref 0.0–5.0)
HCT: 42.1 % (ref 39.0–52.0)
Lymphs Abs: 1.4 10*3/uL (ref 0.7–4.0)
Monocytes Relative: 11.1 % (ref 3.0–12.0)
Neutrophils Relative %: 52.5 % (ref 43.0–77.0)
Platelets: 251 10*3/uL (ref 150.0–400.0)
RBC: 5.08 Mil/uL (ref 4.22–5.81)
WBC: 4.6 10*3/uL (ref 4.5–10.5)

## 2012-07-29 LAB — HEPATIC FUNCTION PANEL
Alkaline Phosphatase: 46 U/L (ref 39–117)
Bilirubin, Direct: 0.1 mg/dL (ref 0.0–0.3)

## 2012-07-29 LAB — FERRITIN: Ferritin: 10.3 ng/mL — ABNORMAL LOW (ref 22.0–322.0)

## 2012-07-29 LAB — LIPID PANEL
Cholesterol: 187 mg/dL (ref 0–200)
HDL: 34.3 mg/dL — ABNORMAL LOW (ref >39.00)
VLDL: 37.2 mg/dL (ref 0.0–40.0)

## 2012-08-05 ENCOUNTER — Encounter: Payer: Self-pay | Admitting: Internal Medicine

## 2012-08-05 ENCOUNTER — Ambulatory Visit (INDEPENDENT_AMBULATORY_CARE_PROVIDER_SITE_OTHER): Payer: Federal, State, Local not specified - PPO | Admitting: Internal Medicine

## 2012-08-05 VITALS — BP 162/88 | HR 63 | Temp 98.8°F | Ht 68.25 in | Wt 217.0 lb

## 2012-08-05 DIAGNOSIS — F329 Major depressive disorder, single episode, unspecified: Secondary | ICD-10-CM

## 2012-08-05 DIAGNOSIS — R1011 Right upper quadrant pain: Secondary | ICD-10-CM

## 2012-08-05 DIAGNOSIS — R7309 Other abnormal glucose: Secondary | ICD-10-CM

## 2012-08-05 DIAGNOSIS — I10 Essential (primary) hypertension: Secondary | ICD-10-CM

## 2012-08-05 DIAGNOSIS — D649 Anemia, unspecified: Secondary | ICD-10-CM

## 2012-08-05 DIAGNOSIS — Z0000 Encounter for general adult medical examination without abnormal findings: Secondary | ICD-10-CM

## 2012-08-05 DIAGNOSIS — K219 Gastro-esophageal reflux disease without esophagitis: Secondary | ICD-10-CM

## 2012-08-05 DIAGNOSIS — R7989 Other specified abnormal findings of blood chemistry: Secondary | ICD-10-CM

## 2012-08-05 DIAGNOSIS — E785 Hyperlipidemia, unspecified: Secondary | ICD-10-CM

## 2012-08-05 DIAGNOSIS — Z789 Other specified health status: Secondary | ICD-10-CM

## 2012-08-05 DIAGNOSIS — F1010 Alcohol abuse, uncomplicated: Secondary | ICD-10-CM

## 2012-08-05 DIAGNOSIS — F3289 Other specified depressive episodes: Secondary | ICD-10-CM

## 2012-08-05 DIAGNOSIS — Z23 Encounter for immunization: Secondary | ICD-10-CM

## 2012-08-05 DIAGNOSIS — N138 Other obstructive and reflux uropathy: Secondary | ICD-10-CM | POA: Insufficient documentation

## 2012-08-05 DIAGNOSIS — F1090 Alcohol use, unspecified, uncomplicated: Secondary | ICD-10-CM

## 2012-08-05 DIAGNOSIS — N401 Enlarged prostate with lower urinary tract symptoms: Secondary | ICD-10-CM

## 2012-08-05 DIAGNOSIS — E291 Testicular hypofunction: Secondary | ICD-10-CM

## 2012-08-05 DIAGNOSIS — E669 Obesity, unspecified: Secondary | ICD-10-CM

## 2012-08-05 MED ORDER — TADALAFIL 5 MG PO TABS: 5.0000 mg | ORAL_TABLET | Freq: Every day | ORAL | Status: DC

## 2012-08-05 NOTE — Progress Notes (Signed)
Chief Complaint  Patient presents with  . Annual Exam  . Hypertension  . Abdominal Pain    HPI: Patient comes in today for Preventive Health Care visit and for follow up of  multiple medical problems. No ed visits major changes in health.has some concerns  He is having mild ruq discomfort without NVD hematuria.    Taking nexium as needed for GERD sx.   Taking testosterone  But not every day.    lexapro still helpful. fo depression anxiety    BP  Not checking readings but taking medication, diovan hctz and norvasc . Denies se .    Continues to use daily etoh. Not as good diet.   hasnt donated blood for about 6 months or so . No bleeding  ROS:  GEN/ HEENT: No fever, significant weight changes sweats headaches vision problems hearing changes, CV/ PULM; No chest pain shortness of breath cough, syncope,edema  change in exercise tolerance. GI /GU: No adominal pain, vomiting, change in bowel habits. No blood in the stool. Has some nocturia and incomplete emptying feeling in the am  But stream ok otherwise.,  SKIN/HEME: ,no acute skin rashes suspicious lesions or bleeding. No lymphadenopathy, nodules, masses.  NEURO/ PSYCH:  No neurologic signs such as weakness numbness. No depression anxiety. IMM/ Allergy: No unusual infections.  Allergy .   REST of 12 system review negative except as per HPI   Past Medical History  Diagnosis Date  . OBESITY 08/09/2010  . IRON DEFICIENCY 07/18/2010  . DEPRESSION 12/02/2008  . HYPERTENSION 12/02/2008  . GERD 12/02/2008     no endo  . SNORING 07/18/2010  . HYPERGLYCEMIA, MILD 01/19/2010  . 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 Muscle fasciculation 11/14/2010  . Muscle twitch 11/14/2010    Left rm  Poss overuse  R/o metabolic      Family History  Problem Relation Age of Onset  . Stroke Mother     11  . Depression Mother   . Hypertension Father   . Heart disease Father     22  . Alcohol abuse Father   . Sleep apnea Sister      History   Social History  . Marital Status: Married    Spouse Name: N/A    Number of Children: N/A  . Years of Education: N/A   Social History Main Topics  . Smoking status: Former Games developer  . Smokeless tobacco: Never Used  . Alcohol Use: 3.0 oz/week    6 drink(s) per week  . Drug Use: No  . Sexually Active: None   Other Topics Concern  . 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 MarriedFormer smoker occasional relapseAlcohol  at x16-18 per week.Originally from [place]  To [facility] of 2 dogs and cats     Outpatient Encounter Prescriptions as of 08/05/2012  Medication Sig Dispense Refill  . amLODipine (NORVASC) 10 MG tablet Take 1 tablet (10 mg total) by mouth daily.  90 tablet  1  . escitalopram (LEXAPRO) 10 MG tablet Take 1 tablet (10 mg total) by mouth daily.  90 tablet  1  . esomeprazole (NEXIUM) 40 MG capsule Take 1 capsule (40 mg total) by mouth daily before breakfast.  90 capsule  3  . fluticasone (FLONASE) 50 MCG/ACT nasal spray Place 2 sprays into the nose daily.  16 g  6  .  potassium chloride SA (K-DUR,KLOR-CON) 20 MEQ tablet Take 1 tablet (20 mEq total) by mouth 2 (two) times daily.  180 tablet  1  . tadalafil (CIALIS) 5 MG tablet Take 1 tablet (5 mg total) by mouth daily. For BPH  90 tablet  1  . Testosterone (AXIRON) 30 MG/ACT SOLN Place 1 application onto the skin daily.  90 mL  0  . valsartan-hydrochlorothiazide (DIOVAN-HCT) 320-25 MG per tablet Take 1 tablet by mouth daily.  90 tablet  1  . [DISCONTINUED] tadalafil (CIALIS) 20 MG tablet Take 0.5-1 tablets (10-20 mg total) by mouth as needed for erectile dysfunction.  6 tablet  11  . [DISCONTINUED] tadalafil (CIALIS) 5 MG tablet Take 1 tablet (5 mg total) by mouth daily as needed for erectile dysfunction.  30 tablet  11  . [DISCONTINUED] tadalafil (CIALIS) 5 MG tablet Take 1 tablet (5 mg total) by mouth daily. For BPH or as directed  30 tablet  3     EXAM:BP 162/88  Pulse 63  Temp 98.8 F (37.1 C) (Oral)  Ht 5' 8.25" (1.734 m)  Wt 217 lb (98.431 kg)  BMI 32.75 kg/m2  SpO2 97% Physical Exam: Vital signs reviewed repeat BP 150/88 right arm  ZOX:WRUE is a well-developed well-nourished alert cooperative   male who appears  stated age in no acute distress.  HEENT: normocephalic atraumatic , Eyes: PERRL EOM's full, conjunctiva clear, Nares: paten,t no deformity discharge or tenderness., Ears: no deformity EAC's clear TMs with normal landmarks. Mouth: clear OP, no lesions, edema.  Moist mucous membranes. Dentition in adequate repair. NECK: supple without masses, thyromegaly or bruits. CHEST/PULM:  Clear to auscultation and percussion breath sounds equal no wheeze , rales or rhonchi. No chest wall deformities or tenderness. CV: PMI is nondisplaced, S1 S2 no gallops, murmurs, rubs. Peripheral pulses are full without delay.No JVD .  ABDOMEN: Bowel sounds normal nontender  No guard or rebound, no hepato splenomegal no CVA tenderness.  No hernia. Mild discomfort ruq no mass effect  Extremtities:  No clubbing cyanosis or edema, no acute joint swelling or redness no focal atrophy NEURO:  Oriented x3, cranial nerves 3-12 appear to be intact, no obvious focal weakness,gait within normal limits no abnormal reflexes or asymmetrical SKIN: No acute rashes normal turgor, color, no bruising or petechiae. PSYCH: Oriented, good eye contact, no obvious depression anxiety, cognition and judgment appear normal. LN: no cervical axillary inguinal adenopathy Prostate  1+ no nodules   Lab Results  Component Value Date   WBC 4.6 07/29/2012   HGB 13.8 07/29/2012   HCT 42.1 07/29/2012   PLT 251.0 07/29/2012   GLUCOSE 107* 07/29/2012   CHOL 187 07/29/2012   TRIG 186.0* 07/29/2012   HDL 34.30* 07/29/2012   LDLCALC 116* 07/29/2012   ALT 28 07/29/2012   AST 21 07/29/2012   NA 144 07/29/2012   K 4.1 07/29/2012   CL 105 07/29/2012   CREATININE 1.1  07/29/2012   BUN 25* 07/29/2012   CO2 32 07/29/2012   TSH 2.49 07/29/2012   PSA 1.83 07/29/2012   HGBA1C 6.1 11/14/2010   MICROALBUR 1.7 07/08/2010   Lab Results  Component Value Date   TESTOSTERONE 445.16 02/09/2012    ASSESSMENT AND PLAN:  Discussed the following assessment and plan: Preventive Health Care Counseled regarding healthy nutrition, exercise, sleep, injury prevention, calcium vit d and healthy weight .Flu vaccine today.   1. Visit for preventive health examination    2. Need for prophylactic vaccination and inoculation against influenza  3. BPH with obstruction/lower urinary tract symptoms     nocturia and frequency try daily cialis  4. Anemia     better still iron deficient   5. HYPERGLYCEMIA, MILD     lsi advised  weight loss  6. GERD    7. HYPERTENSION     elevated today on triple therapy disc etoh other monitor and fu   8. DEPRESSION    9. Heavy alcohol use     advise decrease.   10. RUQ discomfort  US Abdomen Complete   recurrent fam hx gallstones  11. Low serum testosterone level     better goal to 500 will take consistently   12. OBESITY    13. Dyslipidemia       Patient Instructions  Blood pressure is elevated today  limit alcohol get enough sleep. Checked her readings to make sure that they are in a reasonable range below 140/90. Contact us if your blood pressure is consistently elevated.  We'll arrange for an ultrasound of the right side to check her gallbladder in your liver as we discussed. Okay to begin Cialis for BPH prescription given Your anemia is better although you still have a slightly low iron level. Would continue extra iron in the short run. We will continue to monitor. 1 sugars starting to creep back up to improve diet exercise and we will monitor.   Lorretta Harp

## 2012-08-05 NOTE — Patient Instructions (Signed)
Blood pressure is elevated today  limit alcohol get enough sleep. Checked her readings to make sure that they are in a reasonable range below 140/90. Contact us if your blood pressure is consistently elevated.  We'll arrange for an ultrasound of the right side to check her gallbladder in your liver as we discussed. Okay to begin Cialis for BPH prescription given Your anemia is better although you still have a slightly low iron level. Would continue extra iron in the short run. We will continue to monitor. 1 sugars starting to creep back up to improve diet exercise and we will monitor.

## 2012-08-06 ENCOUNTER — Telehealth: Payer: Self-pay | Admitting: Family Medicine

## 2012-08-06 NOTE — Telephone Encounter (Signed)
Just received the PA request via fax. I have a large amt of PAs on my desk. Will do it as soon as I can. Thanks!

## 2012-08-06 NOTE — Telephone Encounter (Signed)
The pt called to day to inform me that he needs a PA on rx for Cialis.  Now has a dx of BPH with obstruction and ED.  Please call [phone].  Thanks!!

## 2012-08-11 ENCOUNTER — Encounter: Payer: Self-pay | Admitting: Internal Medicine

## 2012-08-11 DIAGNOSIS — R1011 Right upper quadrant pain: Secondary | ICD-10-CM | POA: Insufficient documentation

## 2012-08-11 DIAGNOSIS — E785 Hyperlipidemia, unspecified: Secondary | ICD-10-CM | POA: Insufficient documentation

## 2012-08-15 ENCOUNTER — Ambulatory Visit
Admission: RE | Admit: 2012-08-15 | Discharge: 2012-08-15 | Disposition: A | Discharge status: Home / Self Care | Payer: Federal, State, Local not specified - PPO | Source: Ambulatory Visit | Attending: Internal Medicine | Admitting: Internal Medicine

## 2012-08-15 DIAGNOSIS — R1011 Right upper quadrant pain: Secondary | ICD-10-CM

## 2012-09-26 ENCOUNTER — Other Ambulatory Visit: Payer: Self-pay | Admitting: Internal Medicine

## 2012-12-08 IMAGING — US US ABDOMEN COMPLETE
1 series · 13 of 25 positions shown · non-contrast
Comparison: CT abdomen pelvis of 10/08/2009

CLINICAL DATA: Right upper quadrant discomfort, history of reflux

COMPLETE ABDOMINAL ULTRASOUND

[Series 1: us abdomen complete · 0.43mm/px · 13 of 95 slices shown]
[im 1/95]
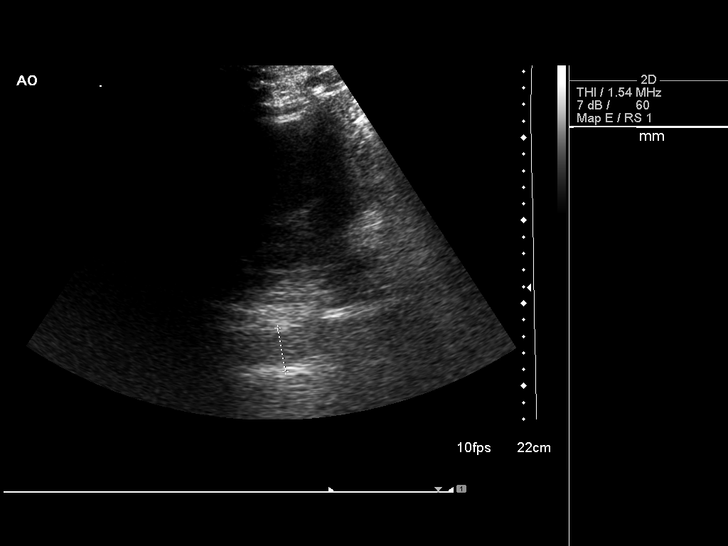
[im 8/95]
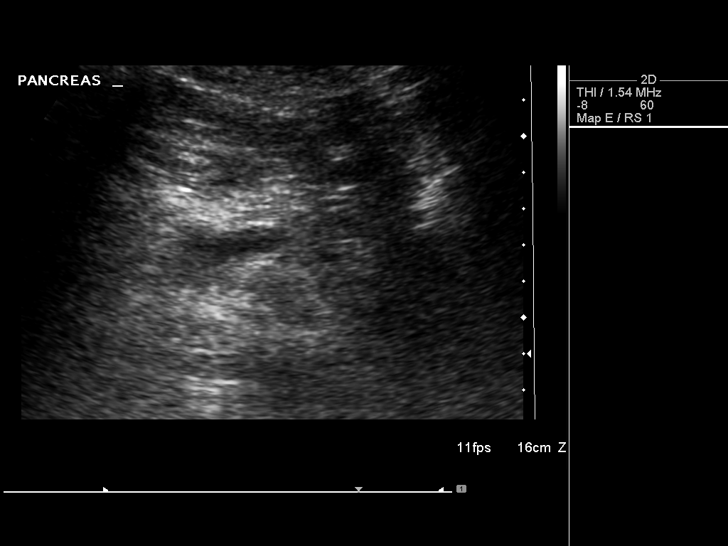
[im 16/95]
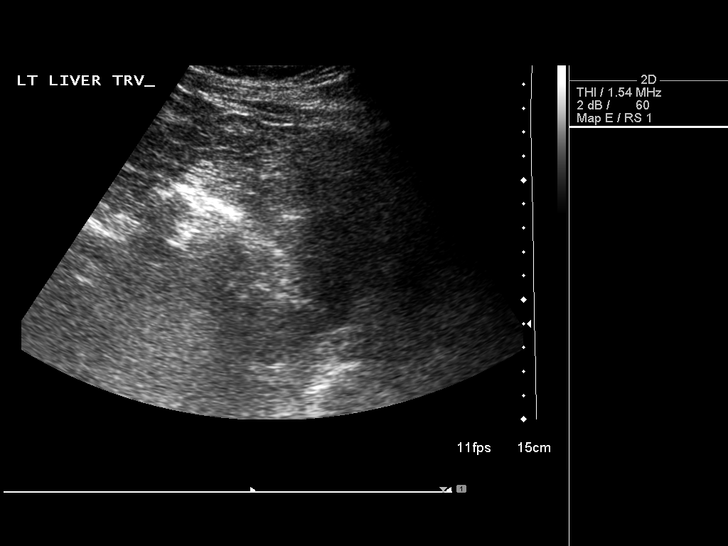
[im 24/95]
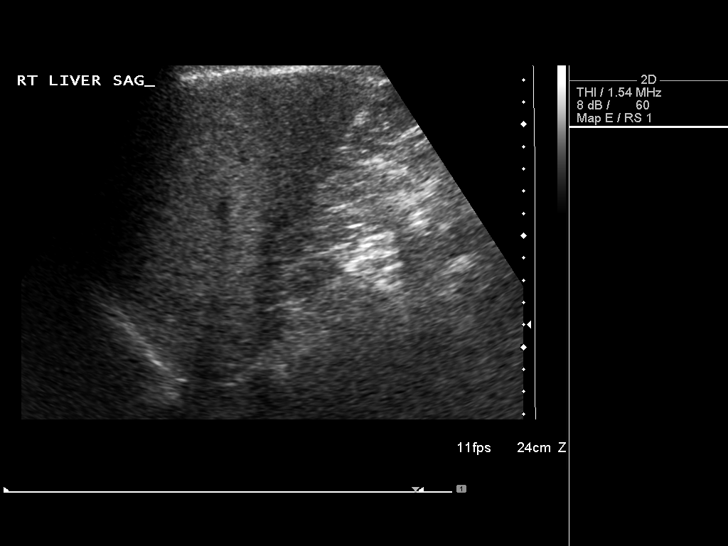
[im 32/95]
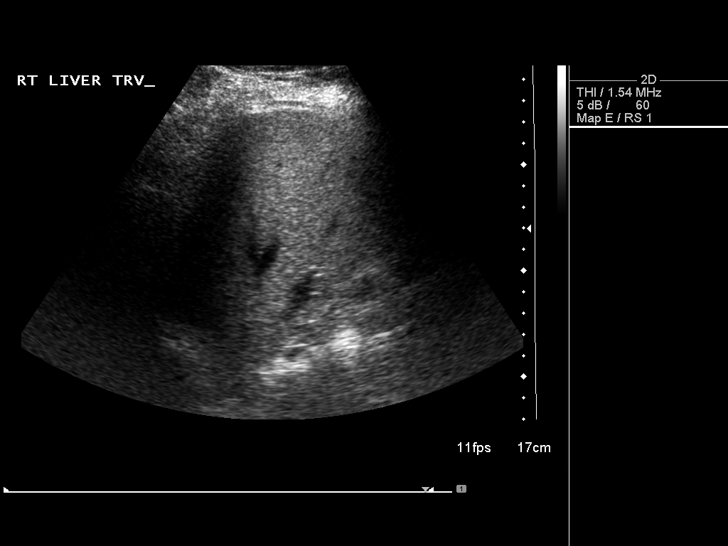
[im 40/95]
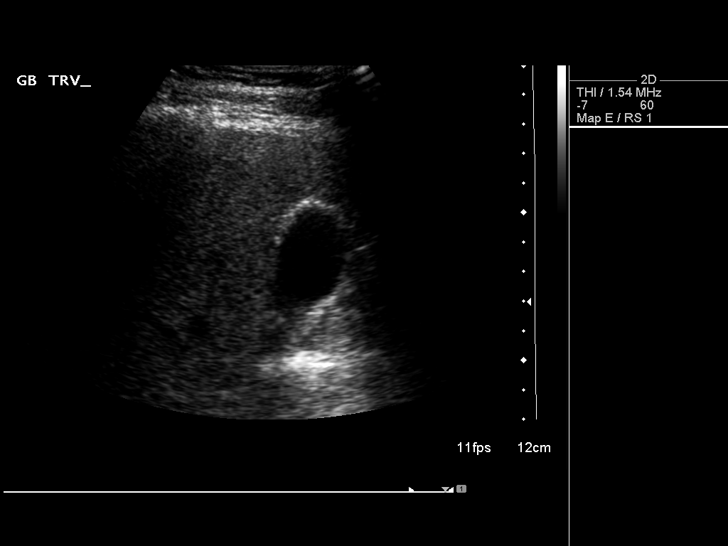
[im 48/95]
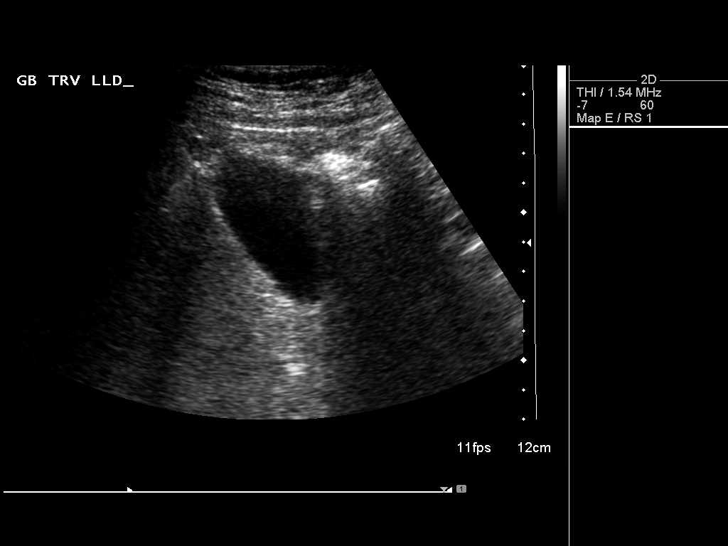
[im 55/95]
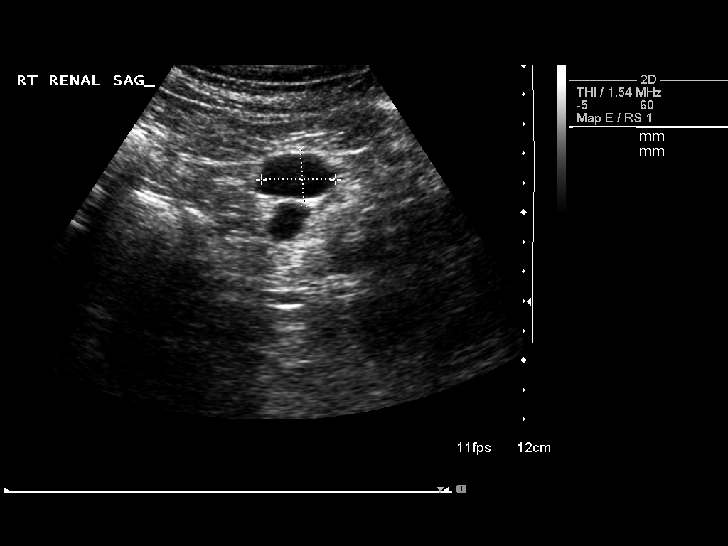
[im 63/95]
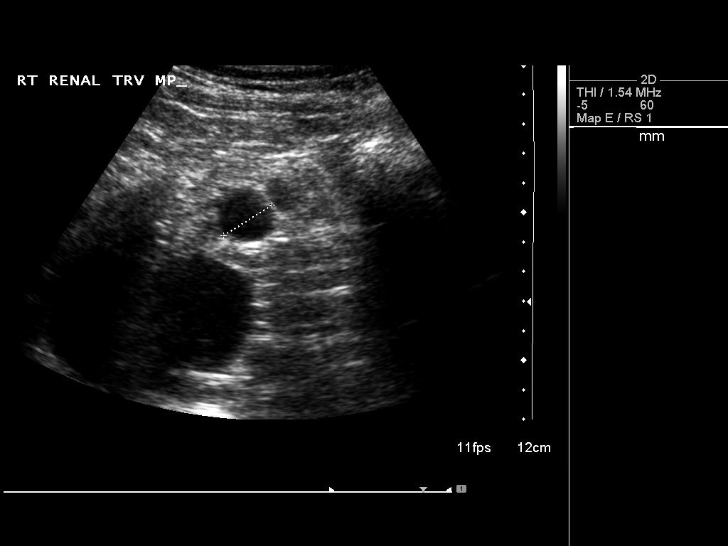
[im 71/95]
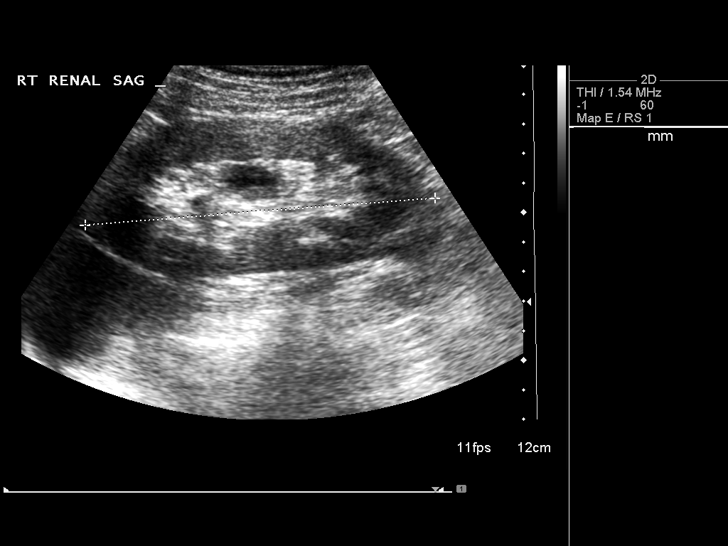
[im 79/95]
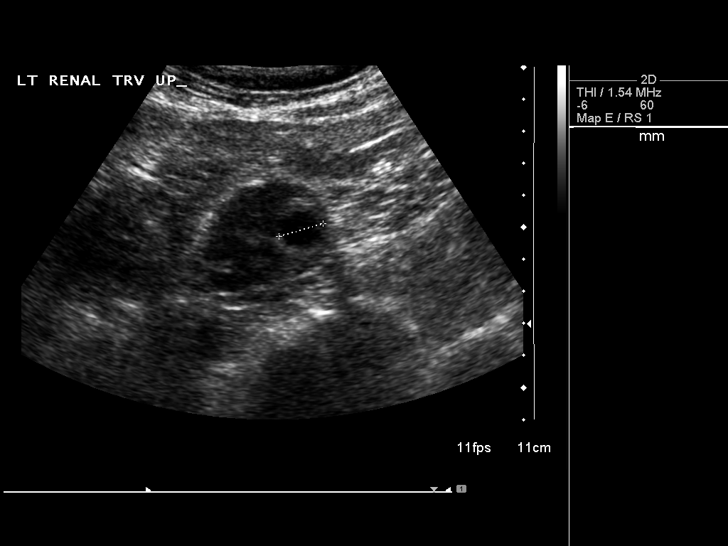
[im 87/95]
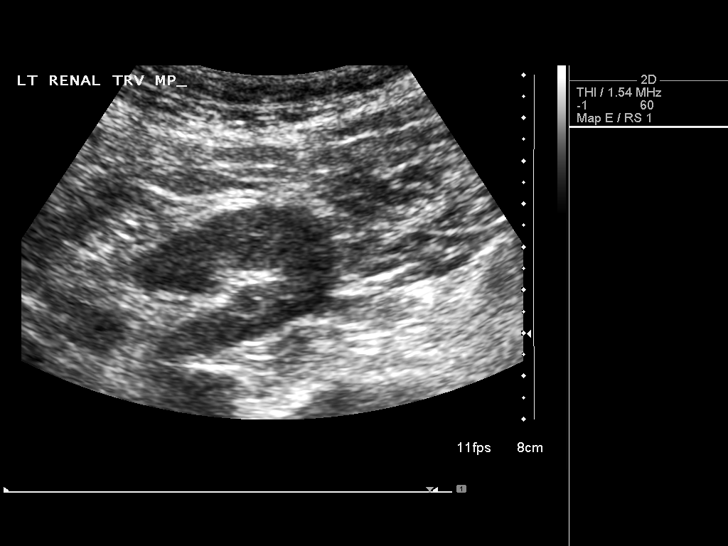
[im 95/95]
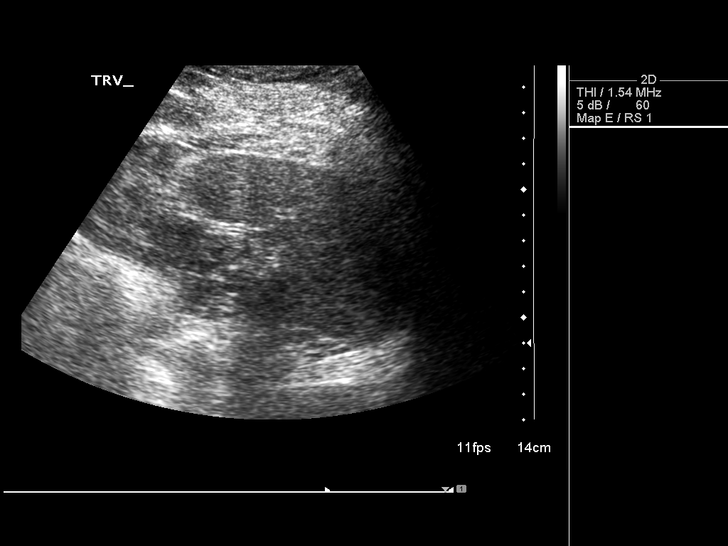

[13 of 25 positions shown; findings below may reference images not displayed]

FINDINGS: Gallbladder:  The gallbladder is visualized and no gallstones are
noted.  There is no pain over the gallbladder with compression.

Common bile duct:  The common bile duct is normal measuring 4.5 mm
in diameter.

Liver:  The liver has a normal echogenic pattern.  No ductal
dilatation seen.

IVC:  The IVC is partially obscured by bowel gas.

Pancreas:  The pancreas also is partially obscured by bowel gas
with the mid body appearing normal.

Spleen:  The spleen is normal measuring 6.7 cm sagittally.

Right Kidney:  A variation of horseshoe kidney is noted.  No
hydronephrosis is seen.  The right kidney measures 11.9 cm
sagittally.  Several renal cysts are present the largest of which
is septated measuring 5.1 x 7.1 x 5.2 cm.

Left Kidney:  Again the horseshoe variation is noted.  The left
kidney measures 13.2 cm sagittally with no evidence of
hydronephrosis.  Cysts are present the largest measuring 2.8 x
x 2.4 cm.

Abdominal aorta:  The abdominal aorta is normal in caliber with the
iliac arteries obscured by bowel gas.
IMPRESSION: 1.  No gallstones.  No ductal dilatation.
2.  The pancreas is partially obscured by bowel gas.
3.  Variation of horseshoe kidney.  Multiple renal cysts.  No
hydronephrosis or solid renal lesion is seen.

## 2012-12-18 ENCOUNTER — Other Ambulatory Visit: Payer: Self-pay | Admitting: Family Medicine

## 2012-12-18 MED ORDER — ESCITALOPRAM OXALATE 10 MG PO TABS: 10.0000 mg | ORAL_TABLET | Freq: Every day | ORAL | Status: DC

## 2012-12-18 MED ORDER — AMLODIPINE BESYLATE 10 MG PO TABS: 10.0000 mg | ORAL_TABLET | Freq: Every day | ORAL | Status: DC

## 2012-12-18 MED ORDER — VALSARTAN-HYDROCHLOROTHIAZIDE 320-25 MG PO TABS: 1.0000 | ORAL_TABLET | Freq: Every day | ORAL | Status: DC

## 2012-12-18 MED ORDER — ESOMEPRAZOLE MAGNESIUM 40 MG PO CPDR: 40.0000 mg | DELAYED_RELEASE_CAPSULE | Freq: Every day | ORAL | Status: DC

## 2012-12-31 ENCOUNTER — Other Ambulatory Visit: Payer: Self-pay | Admitting: Internal Medicine

## 2013-01-02 NOTE — Telephone Encounter (Signed)
Cialis Last filled:  08/05/12 #90 with 1 additional refill. Last seen: 08/05/12 Follow up: 02/03/13

## 2013-01-02 NOTE — Telephone Encounter (Signed)
Ok to refill 90 days  

## 2013-01-27 ENCOUNTER — Other Ambulatory Visit (INDEPENDENT_AMBULATORY_CARE_PROVIDER_SITE_OTHER): Payer: Federal, State, Local not specified - PPO

## 2013-01-27 DIAGNOSIS — D509 Iron deficiency anemia, unspecified: Secondary | ICD-10-CM

## 2013-01-27 DIAGNOSIS — I10 Essential (primary) hypertension: Secondary | ICD-10-CM

## 2013-01-27 LAB — BASIC METABOLIC PANEL
CO2: 30 mEq/L (ref 19–32)
Chloride: 103 mEq/L (ref 96–112)
Creatinine, Ser: 1.1 mg/dL (ref 0.4–1.5)
Glucose, Bld: 106 mg/dL — ABNORMAL HIGH (ref 70–99)

## 2013-01-27 LAB — CBC WITH DIFFERENTIAL/PLATELET
Basophils Absolute: 0.1 10*3/uL (ref 0.0–0.1)
Basophils Relative: 1 % (ref 0.0–3.0)
Eosinophils Absolute: 0.2 10*3/uL (ref 0.0–0.7)
MCHC: 34.4 g/dL (ref 30.0–36.0)
MCV: 84.8 fl (ref 78.0–100.0)
Monocytes Absolute: 0.5 10*3/uL (ref 0.1–1.0)
Neutro Abs: 2.8 10*3/uL (ref 1.4–7.7)
Neutrophils Relative %: 52.9 % (ref 43.0–77.0)
RBC: 4.88 Mil/uL (ref 4.22–5.81)
RDW: 15.4 % — ABNORMAL HIGH (ref 11.5–14.6)

## 2013-01-27 LAB — HEMOGLOBIN A1C: Hgb A1c MFr Bld: 6 % (ref 4.6–6.5)

## 2013-01-31 ENCOUNTER — Ambulatory Visit (INDEPENDENT_AMBULATORY_CARE_PROVIDER_SITE_OTHER): Payer: Federal, State, Local not specified - PPO | Admitting: Internal Medicine

## 2013-01-31 ENCOUNTER — Encounter: Payer: Self-pay | Admitting: Internal Medicine

## 2013-01-31 VITALS — BP 138/70 | HR 64 | Temp 98.7°F | Wt 218.0 lb

## 2013-01-31 DIAGNOSIS — D509 Iron deficiency anemia, unspecified: Secondary | ICD-10-CM

## 2013-01-31 DIAGNOSIS — E876 Hypokalemia: Secondary | ICD-10-CM

## 2013-01-31 DIAGNOSIS — K529 Noninfective gastroenteritis and colitis, unspecified: Secondary | ICD-10-CM

## 2013-01-31 DIAGNOSIS — R197 Diarrhea, unspecified: Secondary | ICD-10-CM

## 2013-01-31 DIAGNOSIS — I10 Essential (primary) hypertension: Secondary | ICD-10-CM

## 2013-01-31 NOTE — Patient Instructions (Signed)
Continue lifestyle intervention healthy eating and exercise . consider  Trying   probitotic   Daily for a few weeks and see if helps.   Increase potassium  To twice a day and   Then check  Lab BMP and magnesium in 2-3 weeks .

## 2013-01-31 NOTE — Progress Notes (Signed)
Chief Complaint  Patient presents with  . Follow-up    HPI: 6 month medication management   Anemia  No donation feels ok.  No bleeding HT   Taking meds  Sometime readings 140 150 range  Testosterone  Stopped concern about cv se and didn help him much anyway  Potassium taking   Only one 20 per day no cramps Glucose  Trying to eat healthy and lose weight  stooling post eating . Up to 3 x per day   More in am after coffee no juices   ? If has IBS had colonoscopy.   ROS: See pertinent positives and negatives per HPI. No cp sob syncope still drinking about the same   Past Medical History  Diagnosis Date  . OBESITY 08/09/2010  . IRON DEFICIENCY 07/18/2010  . DEPRESSION 12/02/2008  . HYPERTENSION 12/02/2008  . GERD 12/02/2008     no endo  . SNORING 07/18/2010  . HYPERGLYCEMIA, MILD 01/19/2010  . 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 Muscle fasciculation 11/14/2010  . Muscle twitch 11/14/2010    Left rm  Poss overuse  R/o metabolic      Family History  Problem Relation Age of Onset  . Stroke Mother     33  . Depression Mother   . Hypertension Father   . Heart disease Father     64  . Alcohol abuse Father   . Sleep apnea Sister     History   Social History  . Marital Status: Married    Spouse Name: N/A    Number of Children: N/A  . Years of Education: N/A   Social History Main Topics  . Smoking status: Former Games developer  . Smokeless tobacco: Never Used  . Alcohol Use: 3.0 oz/week    6 drink(s) per week  . Drug Use: No  . Sexually Active: None   Other Topics Concern  . 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   Originally from [place]  To [place]   [facility] of 2 dogs and cats           Outpatient Encounter Prescriptions as of 01/31/2013  Medication Sig Dispense Refill  . amLODipine (NORVASC) 10 MG tablet TAKE 1  TABLET DAILY  90 tablet  0  . CIALIS 5 MG tablet TAKE 1 TABLET DAILY FOR    BENIGN PROSTATIC           HYPERTROPHY  90 tablet  0  . escitalopram (LEXAPRO) 10 MG tablet TAKE 1 TABLET DAILY  90 tablet  0  . esomeprazole (NEXIUM) 40 MG capsule Take 1 capsule (40 mg total) by mouth daily before breakfast.  5 capsule  0  . potassium chloride SA (K-DUR,KLOR-CON) 20 MEQ tablet Take 1 tablet (20 mEq total) by mouth 2 (two) times daily.  180 tablet  1  . valsartan-hydrochlorothiazide (DIOVAN-HCT) 320-25 MG per tablet TAKE 1 TABLET DAILY  90 tablet  0  . fluticasone (FLONASE) 50 MCG/ACT nasal spray Place 2 sprays into the nose daily.  16 g  6  . [DISCONTINUED] amLODipine (NORVASC) 10 MG tablet Take 1 tablet (10 mg total) by mouth daily.  5 tablet  0  . [DISCONTINUED] AXIRON 30 MG/ACT SOLN APPLY ONE PUMP DAILY AS DIRECTED  90 mL  5  . [DISCONTINUED] escitalopram (LEXAPRO) 10 MG tablet Take 1 tablet (10 mg total) by mouth daily.  5 tablet  0  . [DISCONTINUED] valsartan-hydrochlorothiazide (DIOVAN-HCT) 320-25 MG per tablet Take 1 tablet by mouth daily.  5 tablet  0   No facility-administered encounter medications on file as of 01/31/2013.    EXAM:  BP 138/70  Pulse 64  Temp(Src) 98.7 F (37.1 C) (Oral)  Wt 218 lb (98.884 kg)  BMI 32.89 kg/m2  SpO2 96%  Body mass index is 32.89 kg/(m^2).  GENERAL: vitals reviewed and listed above, alert, oriented, appears well hydrated and in no acute distress  HEENT: atraumatic, conjunctiva  clear, no obvious abnormalities on inspection of external nose and ears  NECK: no obvious masses on inspection palpation   LUNGS: clear to auscultation bilaterally, no wheezes, rales or rhonchi, good air movement  CV: HRRR, no clubbing cyanosis or  peripheral edema nl cap refill  Abdomen:  Sof,t normal bowel sounds without hepatosplenomegaly, no guarding rebound or masses no CVA tenderness  MS: moves all extremities without noticeable focal  abnormality  PSYCH: pleasant  and cooperative, no obvious depression or anxiety  ASSESSMENT AND PLAN:  Discussed the following assessment and plan:  HYPERTENSION - acceptable today  high normal   IRON DEFICIENCY  Hypokalemia - only taking once K per day increase to 2 as per rx and check bmp mg in 2-4 weeks   Frequent stools - post prandial no diarrhea poss caffiene aggravated neg colon consider  probiotics  Samples of align given to  Try  -Patient advised to return or notify health care team  if symptoms worsen or persist or new concerns arise. 6 months cpx with labs   Patient Instructions  Continue lifestyle intervention healthy eating and exercise . consider  Trying   probitotic   Daily for a few weeks and see if helps.   Increase potassium  To twice a day and   Then check  Lab BMP and magnesium in 2-3 weeks .    Neta Mends. Panosh M.D.

## 2013-02-03 ENCOUNTER — Ambulatory Visit: Payer: Federal, State, Local not specified - PPO | Admitting: Internal Medicine

## 2013-02-10 ENCOUNTER — Ambulatory Visit: Payer: Federal, State, Local not specified - PPO | Admitting: Internal Medicine

## 2013-02-27 ENCOUNTER — Other Ambulatory Visit (INDEPENDENT_AMBULATORY_CARE_PROVIDER_SITE_OTHER): Payer: Federal, State, Local not specified - PPO

## 2013-02-27 DIAGNOSIS — E876 Hypokalemia: Secondary | ICD-10-CM

## 2013-02-27 DIAGNOSIS — I10 Essential (primary) hypertension: Secondary | ICD-10-CM

## 2013-02-27 LAB — BASIC METABOLIC PANEL
CO2: 28 mEq/L (ref 19–32)
Calcium: 8.6 mg/dL (ref 8.4–10.5)
Creatinine, Ser: 1 mg/dL (ref 0.4–1.5)
Glucose, Bld: 88 mg/dL (ref 70–99)

## 2013-02-28 ENCOUNTER — Other Ambulatory Visit: Payer: Federal, State, Local not specified - PPO

## 2013-03-11 ENCOUNTER — Ambulatory Visit (INDEPENDENT_AMBULATORY_CARE_PROVIDER_SITE_OTHER): Payer: Federal, State, Local not specified - PPO | Admitting: Internal Medicine

## 2013-03-11 ENCOUNTER — Encounter: Payer: Self-pay | Admitting: Internal Medicine

## 2013-03-11 VITALS — BP 140/82 | HR 64 | Temp 98.3°F | Wt 219.0 lb

## 2013-03-11 DIAGNOSIS — D509 Iron deficiency anemia, unspecified: Secondary | ICD-10-CM

## 2013-03-11 DIAGNOSIS — I10 Essential (primary) hypertension: Secondary | ICD-10-CM

## 2013-03-11 DIAGNOSIS — E876 Hypokalemia: Secondary | ICD-10-CM

## 2013-03-11 DIAGNOSIS — Z6833 Body mass index (BMI) 33.0-33.9, adult: Secondary | ICD-10-CM

## 2013-03-11 NOTE — Patient Instructions (Signed)
Lower  Sodium  Processed foods . Increase potassium rich food.      Stay on the 2 20 meq potassium per day  Losing weight  In a healthy manner  .     Will help blood pressure  Also .

## 2013-03-11 NOTE — Progress Notes (Signed)
Chief Complaint  Patient presents with  . Follow-up    Lab work    HPI: Patient comes in today for SDA for   ongoing problem evaluation. Fu labs potassium and ht  . Still doing 4-5 beers per night and food with friends  Still active .   Taking 2 20 me k per day. On a continual basis no unusual muscle spasms. Has had frequent stools still has tried probiotics might have helped a little bit.  Feels his mood is stable. Realizes that he's been gaining weight and weight loss might be helpful. ROS: See pertinent positives and negatives per HPI. No unusual bleeding syncope or decompensation in his mood.  Past Medical History  Diagnosis Date  . OBESITY 08/09/2010  . IRON DEFICIENCY 07/18/2010  . DEPRESSION 12/02/2008  . HYPERTENSION 12/02/2008  . GERD 12/02/2008     no endo  . SNORING 07/18/2010  . HYPERGLYCEMIA, MILD 01/19/2010  . 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 Muscle fasciculation 11/14/2010  . Muscle twitch 11/14/2010    Left rm  Poss overuse  R/o metabolic      Family History  Problem Relation Age of Onset  . Stroke Mother     64  . Depression Mother   . Hypertension Father   . Heart disease Father     42  . Alcohol abuse Father   . Sleep apnea Sister     History   Social History  . Marital Status: Married    Spouse Name: N/A    Number of Children: N/A  . Years of Education: N/A   Social History Main Topics  . Smoking status: Former Games developer  . Smokeless tobacco: Never Used  . Alcohol Use: 3.0 oz/week    6 drink(s) per week  . Drug Use: No  . Sexually Active: None   Other Topics Concern  . 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   Originally from [place]  To [place]   [facility] of 2 dogs and cats           Outpatient Encounter Prescriptions as of 03/11/2013  Medication Sig Dispense Refill  . amLODipine  (NORVASC) 10 MG tablet TAKE 1 TABLET DAILY  90 tablet  0  . CIALIS 5 MG tablet TAKE 1 TABLET DAILY FOR    BENIGN PROSTATIC           HYPERTROPHY  90 tablet  0  . escitalopram (LEXAPRO) 10 MG tablet TAKE 1 TABLET DAILY  90 tablet  0  . esomeprazole (NEXIUM) 40 MG capsule Take 1 capsule (40 mg total) by mouth daily before breakfast.  5 capsule  0  . potassium chloride SA (K-DUR,KLOR-CON) 20 MEQ tablet Take 1 tablet (20 mEq total) by mouth 2 (two) times daily.  180 tablet  1  . valsartan-hydrochlorothiazide (DIOVAN-HCT) 320-25 MG per tablet TAKE 1 TABLET DAILY  90 tablet  0  . fluticasone (FLONASE) 50 MCG/ACT nasal spray Place 2 sprays into the nose daily.  16 g  6   No facility-administered encounter medications on file as of 03/11/2013.    EXAM:  BP 140/82  Pulse 64  Temp(Src) 98.3 F (36.8 C) (Axillary)  Wt 219 lb (99.338 kg)  BMI 33.04 kg/m2  SpO2  98%  Body mass index is 33.04 kg/(m^2).  GENERAL: vitals reviewed and listed above, alert, oriented, appears well hydrated and in no acute distress  PSYCH: pleasant and cooperative, no obvious depression or anxiety Lab Results  Component Value Date   WBC 5.4 01/27/2013   HGB 14.3 01/27/2013   HCT 41.4 01/27/2013   PLT 255.0 01/27/2013   GLUCOSE 88 02/27/2013   CHOL 187 07/29/2012   TRIG 186.0* 07/29/2012   HDL 34.30* 07/29/2012   LDLCALC 116* 07/29/2012   ALT 28 07/29/2012   AST 21 07/29/2012   NA 137 02/27/2013   K 3.4* 02/27/2013   CL 101 02/27/2013   CREATININE 1.0 02/27/2013   BUN 14 02/27/2013   CO2 28 02/27/2013   TSH 2.49 07/29/2012   PSA 1.83 07/29/2012   HGBA1C 6.0 01/27/2013   MICROALBUR 1.7 07/08/2010   Wt Readings from Last 3 Encounters:  03/11/13 219 lb (99.338 kg)  01/31/13 218 lb (98.884 kg)  08/05/12 217 lb (98.431 kg)    ASSESSMENT AND PLAN:  Discussed the following assessment and plan: Reviewed laboratory studies with patient. Hypokalemia  HYPERTENSION  IRON DEFICIENCY  BMI 33.0-33.9,adult  Continue  potassium supplements at 20 mEq 2 a day. I don't think his loose stools are relating to his lower potassium.  Add ing med  ? If etoh diet  Adding to this.  Problem . Thus dietary changes do not feel comfortable increasing his potassium supplementation to a much higher level.  We could consider decreasing his diuretic if appropriate. However at this time to work on his diabetes he was to lose weight any way this should help his blood pressure and his potassium.  He would like to know the status of his iron again we'll recheck that status will recheck his chemistries.  Otherwise followup in 2-3 months.  -Patient advised to return or notify health care team  if symptoms worsen or persist or new concerns arise.  Patient Instructions  Lower  Sodium  Processed foods . Increase potassium rich food.      Stay on the 2 20 meq potassium per day  Losing weight  In a healthy manner  .     Will help blood pressure  Also .       Neta Mends. Panosh M.D.

## 2013-03-14 DIAGNOSIS — Z6833 Body mass index (BMI) 33.0-33.9, adult: Secondary | ICD-10-CM | POA: Insufficient documentation

## 2013-04-07 ENCOUNTER — Other Ambulatory Visit: Payer: Self-pay | Admitting: Internal Medicine

## 2013-06-03 ENCOUNTER — Other Ambulatory Visit: Payer: Self-pay | Admitting: Family Medicine

## 2013-06-03 MED ORDER — ESOMEPRAZOLE MAGNESIUM 40 MG PO CPDR: 40.0000 mg | DELAYED_RELEASE_CAPSULE | Freq: Every day | ORAL | Status: DC

## 2013-08-07 ENCOUNTER — Other Ambulatory Visit: Payer: Self-pay

## 2013-08-14 ENCOUNTER — Telehealth: Payer: Self-pay | Admitting: Internal Medicine

## 2013-08-14 NOTE — Telephone Encounter (Signed)
I received a Prior Authorization request for Axiron on pt.  I do not see a current or recent RX for medication.  Do you want me to process this so the pt can fill the script??  Please Advise.

## 2013-08-15 NOTE — Telephone Encounter (Signed)
Patient returned my call.  Left a message on my machine.  Asked for a text message.  Called the pt back.  Left a message on him cell.  Informed him that I could not send a text message.  Asked for him to call me back.

## 2013-08-15 NOTE — Telephone Encounter (Signed)
Left message on cell for the pt to return my call. 

## 2013-08-18 NOTE — Telephone Encounter (Signed)
Per note on 01/31/13.  Pt has stopped taking testosterone.

## 2013-08-27 ENCOUNTER — Encounter: Payer: Self-pay | Admitting: Internal Medicine

## 2013-08-27 ENCOUNTER — Ambulatory Visit (INDEPENDENT_AMBULATORY_CARE_PROVIDER_SITE_OTHER): Payer: Federal, State, Local not specified - PPO | Admitting: Internal Medicine

## 2013-08-27 ENCOUNTER — Other Ambulatory Visit: Payer: Self-pay | Admitting: Family Medicine

## 2013-08-27 VITALS — BP 140/80 | Temp 98.8°F | Wt 215.0 lb

## 2013-08-27 DIAGNOSIS — D485 Neoplasm of uncertain behavior of skin: Secondary | ICD-10-CM

## 2013-08-27 MED ORDER — ESOMEPRAZOLE MAGNESIUM 40 MG PO CPDR: 40.0000 mg | DELAYED_RELEASE_CAPSULE | Freq: Every day | ORAL | Status: DC

## 2013-08-27 NOTE — Progress Notes (Signed)
Chief Complaint  Patient presents with  . painful growth    HPI: Patient comes in today for SDA for  new problem evaluation. Growth  On l left shoulder since last time here and getting larger and now  tender no bleeding   Strap hits the area and is tender .  Would like it removed if possible. No history of serious skin diseases.  ROS: See pertinent positives and negatives per HPI.  Past Medical History  Diagnosis Date  . OBESITY 08/09/2010  . IRON DEFICIENCY 07/18/2010  . DEPRESSION 12/02/2008  . HYPERTENSION 12/02/2008  . GERD 12/02/2008     no endo  . SNORING 07/18/2010  . HYPERGLYCEMIA, MILD 01/19/2010  . 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 Muscle fasciculation 11/14/2010  . Muscle twitch 11/14/2010    Left rm  Poss overuse  R/o metabolic      Family History  Problem Relation Age of Onset  . Stroke Mother     71  . Depression Mother   . Hypertension Father   . Heart disease Father     40  . Alcohol abuse Father   . Sleep apnea Sister     History   Social History  . Marital Status: Married    Spouse Name: N/A    Number of Children: N/A  . Years of Education: N/A   Social History Main Topics  . Smoking status: Former Games developer  . Smokeless tobacco: Never Used  . Alcohol Use: 3.0 oz/week    6 drink(s) per week  . Drug Use: No  . Sexual Activity: None   Other Topics Concern  . 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   Originally from [place]  To [place]   [facility] of 2 dogs and cats              Outpatient Encounter Prescriptions as of 08/27/2013  Medication Sig  . amLODipine (NORVASC) 10 MG tablet TAKE 1 TABLET DAILY  . amLODipine (NORVASC) 10 MG tablet TAKE 1 TABLET DAILY  . CIALIS 5 MG tablet TAKE 1 TABLET DAILY FOR    BENIGN PROSTATIC           HYPERTROPHY  . escitalopram  (LEXAPRO) 10 MG tablet TAKE 1 TABLET DAILY  . escitalopram (LEXAPRO) 10 MG tablet TAKE 1 TABLET DAILY  . esomeprazole (NEXIUM) 40 MG capsule Take 1 capsule (40 mg total) by mouth daily before breakfast.  . KLOR-CON M20 20 MEQ tablet TAKE 1 TABLET TWICE A DAY  . valsartan-hydrochlorothiazide (DIOVAN-HCT) 320-25 MG per tablet TAKE 1 TABLET DAILY  . valsartan-hydrochlorothiazide (DIOVAN-HCT) 320-25 MG per tablet TAKE 1 TABLET DAILY  . fluticasone (FLONASE) 50 MCG/ACT nasal spray Place 2 sprays into the nose daily.    EXAM:  BP 140/80  Temp(Src) 98.8 F (37.1 C) (Oral)  Wt 215 lb (97.523 kg)  Body mass index is 32.43 kg/(m^2).  GENERAL: vitals reviewed and listed above, alert, oriented, appears well hydrated and in no acute distress HEENT: atraumatic, conjunctiva  clear, no obvious abnormalities on inspection of external nose and ears  NECK: no obvious masses on inspection palpation  Left soulder are  3 mm crusting round  Dark ? Gray colored sk like other  Left shoulder.  Erythematous base LPSYCH: pleasant and cooperative, no obvious depression or anxiety Procedure note: Area prepped with alcohol and Betadine ; 1.5 cc of lidocaine with epi used around the lesion shave excision of lesion sent to pathology.  We achieved hemostasis using Drysol wound dressed locally withocal antibiotic ointment and bandage. No blood loss patient tolerated well. ASSESSMENT AND PLAN:  Discussed the following assessment and plan:  Neoplasm of uncertain behavior of skin - tender left shoulder  - Plan: Dermatology pathology Uncertain lesionit could be a wart orSK that was irritated discussed results. Local care contact us if symptoms of infection otherwise as per results.  Reviewed record patient said he is doing well otherwise he is due for preventive visit we'll schedule for 6 months with labs. -Patient advised to return or notify health care team  if symptoms worsen or persist or new concerns arise.  Patient  Instructions  Keep area covered and clean   Will let  You know of  Pathology. cpx with labs in 6 months    Neta Mends. Kendall Arnell M.D.  Pre visit review using our clinic review tool, if applicable. No additional management support is needed unless otherwise documented below in the visit note.

## 2013-08-27 NOTE — Patient Instructions (Signed)
Keep area covered and clean   Will let  You know of  Pathology. cpx with labs in 6 months

## 2013-09-10 ENCOUNTER — Ambulatory Visit (INDEPENDENT_AMBULATORY_CARE_PROVIDER_SITE_OTHER): Payer: Federal, State, Local not specified - PPO | Admitting: Internal Medicine

## 2013-09-10 ENCOUNTER — Encounter: Payer: Self-pay | Admitting: Internal Medicine

## 2013-09-10 VITALS — BP 150/70 | Temp 98.4°F | Wt 215.0 lb

## 2013-09-10 DIAGNOSIS — C44621 Squamous cell carcinoma of skin of unspecified upper limb, including shoulder: Secondary | ICD-10-CM

## 2013-09-10 DIAGNOSIS — C44629 Squamous cell carcinoma of skin of left upper limb, including shoulder: Secondary | ICD-10-CM

## 2013-09-10 NOTE — Progress Notes (Signed)
Pre visit review using our clinic review tool, if applicable. No additional management support is needed unless otherwise documented below in the visit note. Chief Complaint  Patient presents with  . Follow-up    HPI: Told to come in for followup of his skin removal biopsy results. Couldn't get in touch with him by phone. Doing well the areas fairly defined. ROS: See pertinent positives and negatives per HPI.  Past Medical History  Diagnosis Date  . OBESITY 08/09/2010  . IRON DEFICIENCY 07/18/2010  . DEPRESSION 12/02/2008  . HYPERTENSION 12/02/2008  . GERD 12/02/2008     no endo  . SNORING 07/18/2010  . HYPERGLYCEMIA, MILD 01/19/2010  . 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 Muscle fasciculation 11/14/2010  . Muscle twitch 11/14/2010    Left rm  Poss overuse  R/o metabolic      Family History  Problem Relation Age of Onset  . Stroke Mother     40  . Depression Mother   . Hypertension Father   . Heart disease Father     15  . Alcohol abuse Father   . Sleep apnea Sister     History   Social History  . Marital Status: Married    Spouse Name: N/A    Number of Children: N/A  . Years of Education: N/A   Social History Main Topics  . Smoking status: Former Games developer  . Smokeless tobacco: Never Used  . Alcohol Use: 3.0 oz/week    6 drink(s) per week  . Drug Use: No  . Sexual Activity: Not on file   Other Topics Concern  . 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   Originally from [place]  To [place]   [facility] of 2 dogs and cats              Outpatient Encounter Prescriptions as of 09/10/2013  Medication Sig  . amLODipine (NORVASC) 10 MG tablet TAKE 1 TABLET DAILY  . CIALIS 5 MG tablet TAKE 1 TABLET DAILY FOR    BENIGN PROSTATIC           HYPERTROPHY  . escitalopram (LEXAPRO) 10 MG  tablet TAKE 1 TABLET DAILY  . escitalopram (LEXAPRO) 10 MG tablet TAKE 1 TABLET DAILY  . esomeprazole (NEXIUM) 40 MG capsule Take 1 capsule (40 mg total) by mouth daily before breakfast.  . KLOR-CON M20 20 MEQ tablet TAKE 1 TABLET TWICE A DAY  . valsartan-hydrochlorothiazide (DIOVAN-HCT) 320-25 MG per tablet TAKE 1 TABLET DAILY  . fluticasone (FLONASE) 50 MCG/ACT nasal spray Place 2 sprays into the nose daily.  . [DISCONTINUED] amLODipine (NORVASC) 10 MG tablet TAKE 1 TABLET DAILY  . [DISCONTINUED] valsartan-hydrochlorothiazide (DIOVAN-HCT) 320-25 MG per tablet TAKE 1 TABLET DAILY    EXAM:  BP 150/70  Temp(Src) 98.4 F (36.9 C) (Oral)  Wt 215 lb (97.523 kg)  Body mass index is 32.43 kg/(m^2).  GENERAL: vitals reviewed and listed above, alert, oriented, appears well hydrated and in no acute distress Left upper shoulder area healing biopsy site.  PSYCH: pleasant and cooperative, no obvious depression or anxiety See pathology report. 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 Permission given to contact him on a cell phone after appointment made.  -  Patient Instructions  Will contact about referral   Squamous Cell Carcinoma  Squamous cell carcinoma is the second most common form of skin cancer. It begins in the squamous cells in the outer layer of the skin (epidermis).  CAUSES  Ultraviolet light exposure is the most common cause of squamous cell carcinoma. This may come from sunlight or tanning beds. Squamous cell carcinoma is most common in sun-exposed areas like the face, neck, arms, and hands. However, squamous cell  carcinoma can occur anywhere on the body, including the lips, inside the mouth, the legs, sites of long-term (chronic) scarring, and the anus.  Other causes of squamous cell carcinoma can include:  Exposure to arsenic.  Exposure to radiation.  Exposure to toxic tars and oils. RISK FACTORS Factors that increase your risk for squamous cell carcinoma include:  Having fair skin.  Being middle-aged or elderly.  Heavy sun exposure, especially during childhood.  Repeated sunburns.  Use of tanning beds.  A weakened immune system. This includes patients who have received a transplant and patients with human immunodeficiency virus (HIV) or acquired immunodeficency syndrome (AIDS).  Human papillomavirus infection.  Conditions that cause chronic scarring. This can include burn scars, chronic ulcers, heat (thermal) injuries, and radiation.  Exposure to psoralen plus ultraviolet A light therapy.  Exposure to chemical carcinogens, such as tar, soot, and arsenic.  Chronic, inflammatory conditions such as lupus, lichen planus, or lichen sclerosus.  Chronic infections, such as infections of the bone (osteomyelitis).  Smoking. SYMPTOMS  Squamous cell carcinoma often starts as small, skin-colored (pink or brown) sandpaper-like growths. These growths are called solar keratoses or actinic keratoses. These growths are often more easily felt than seen.  DIAGNOSIS  Your caregiver may be able to tell what is wrong by doing a physical exam. Often, a tissue sample is also taken. The tissue sample is examined under a microscope.  TREATMENT  The treatment for squamous cell carcinoma depends on the size and location of the tumors, as well as your overall health. Possible treatments include:   Mohs surgery. This is a procedure done by a skin doctor (dermatologist or Mohs surgeon) in his or her office. The cancerous cells are removed layer by layer.  Laser surgery to remove the tumor.  Freezing the  tumor with liquid nitrogen (cryosurgery).  Radiation. This may be used for tumors on the face.  Electrodesiccation and curettage. This involves alternately scraping and burning the tumor, using an electric current to control bleeding. If treated soon enough, squamous cell carcinoma rarely spreads to other areas of the body (metastasizes). If left untreated, however, squamous cell carcinoma will destroy the nearby tissues. This can result in the loss of a nose or ear. PREVENTION  Avoid the sun between 10:00 am and 4:00 pm when it is the strongest.  Use a sunscreen or sunblock with sun protection factor 30 or greater.  Apply sunscreen at least 30 minutes before exposure to the sun.  Reapply sunscreen every 2 to 4 hours while you are outside, after swimming, and after excessive sweating.  Always wear protective hats, clothing, and sunglasses with ultraviolet  protection.  Avoid tanning beds. HOME CARE INSTRUCTIONS   Avoid unprotected sun exposure.  Do not smoke.  Follow your caregiver's instructions for self-exams. Look for new growths or changes in your skin.  Keep all follow-up appointments as directed by your caregiver. SEEK MEDICAL CARE IF:   You notice any new growths or changes in your skin.  You have had a squamous cell carcinoma tumor removed and you notice a new growth in the same location. Document Released: 03/25/2003 Document Revised: 12/11/2011 Document Reviewed: 06/12/2011 [facility] Patient Information 2014 [place], [place].      Neta Mends. Panosh M.D.

## 2013-09-10 NOTE — Patient Instructions (Signed)
Will contact about referral   Squamous Cell Carcinoma  Squamous cell carcinoma is the second most common form of skin cancer. It begins in the squamous cells in the outer layer of the skin (epidermis).  CAUSES  Ultraviolet light exposure is the most common cause of squamous cell carcinoma. This may come from sunlight or tanning beds. Squamous cell carcinoma is most common in sun-exposed areas like the face, neck, arms, and hands. However, squamous cell carcinoma can occur anywhere on the body, including the lips, inside the mouth, the legs, sites of long-term (chronic) scarring, and the anus.  Other causes of squamous cell carcinoma can include:  Exposure to arsenic.  Exposure to radiation.  Exposure to toxic tars and oils. RISK FACTORS Factors that increase your risk for squamous cell carcinoma include:  Having fair skin.  Being middle-aged or elderly.  Heavy sun exposure, especially during childhood.  Repeated sunburns.  Use of tanning beds.  A weakened immune system. This includes patients who have received a transplant and patients with human immunodeficiency virus (HIV) or acquired immunodeficency syndrome (AIDS).  Human papillomavirus infection.  Conditions that cause chronic scarring. This can include burn scars, chronic ulcers, heat (thermal) injuries, and radiation.  Exposure to psoralen plus ultraviolet A light therapy.  Exposure to chemical carcinogens, such as tar, soot, and arsenic.  Chronic, inflammatory conditions such as lupus, lichen planus, or lichen sclerosus.  Chronic infections, such as infections of the bone (osteomyelitis).  Smoking. SYMPTOMS  Squamous cell carcinoma often starts as small, skin-colored (pink or brown) sandpaper-like growths. These growths are called solar keratoses or actinic keratoses. These growths are often more easily felt than seen.  DIAGNOSIS  Your caregiver may be able to tell what is wrong by doing a physical exam. Often, a  tissue sample is also taken. The tissue sample is examined under a microscope.  TREATMENT  The treatment for squamous cell carcinoma depends on the size and location of the tumors, as well as your overall health. Possible treatments include:   Mohs surgery. This is a procedure done by a skin doctor (dermatologist or Mohs surgeon) in his or her office. The cancerous cells are removed layer by layer.  Laser surgery to remove the tumor.  Freezing the tumor with liquid nitrogen (cryosurgery).  Radiation. This may be used for tumors on the face.  Electrodesiccation and curettage. This involves alternately scraping and burning the tumor, using an electric current to control bleeding. If treated soon enough, squamous cell carcinoma rarely spreads to other areas of the body (metastasizes). If left untreated, however, squamous cell carcinoma will destroy the nearby tissues. This can result in the loss of a nose or ear. PREVENTION  Avoid the sun between 10:00 am and 4:00 pm when it is the strongest.  Use a sunscreen or sunblock with sun protection factor 30 or greater.  Apply sunscreen at least 30 minutes before exposure to the sun.  Reapply sunscreen every 2 to 4 hours while you are outside, after swimming, and after excessive sweating.  Always wear protective hats, clothing, and sunglasses with ultraviolet protection.  Avoid tanning beds. HOME CARE INSTRUCTIONS   Avoid unprotected sun exposure.  Do not smoke.  Follow your caregiver's instructions for self-exams. Look for new growths or changes in your skin.  Keep all follow-up appointments as directed by your caregiver. SEEK MEDICAL CARE IF:   You notice any new growths or changes in your skin.  You have had a squamous cell carcinoma tumor removed and  you notice a new growth in the same location. Document Released: 03/25/2003 Document Revised: 12/11/2011 Document Reviewed: 06/12/2011 [facility] Patient Information 2014 [place],  [place].

## 2013-10-03 ENCOUNTER — Other Ambulatory Visit: Payer: Self-pay | Admitting: Internal Medicine

## 2013-11-05 ENCOUNTER — Other Ambulatory Visit: Payer: Self-pay | Admitting: Family Medicine

## 2013-11-05 MED ORDER — ESOMEPRAZOLE MAGNESIUM 40 MG PO CPDR: 40.0000 mg | DELAYED_RELEASE_CAPSULE | Freq: Every day | ORAL | Status: DC

## 2013-11-10 ENCOUNTER — Other Ambulatory Visit (INDEPENDENT_AMBULATORY_CARE_PROVIDER_SITE_OTHER): Payer: Federal, State, Local not specified - PPO

## 2013-11-10 DIAGNOSIS — D509 Iron deficiency anemia, unspecified: Secondary | ICD-10-CM

## 2013-11-10 DIAGNOSIS — I10 Essential (primary) hypertension: Secondary | ICD-10-CM

## 2013-11-10 LAB — FERRITIN: FERRITIN: 13.2 ng/mL — AB (ref 22.0–322.0)

## 2013-11-10 LAB — IBC PANEL
IRON: 49 ug/dL (ref 42–165)
SATURATION RATIOS: 13.2 % — AB (ref 20.0–50.0)
TRANSFERRIN: 264.9 mg/dL (ref 212.0–360.0)

## 2013-11-10 LAB — BASIC METABOLIC PANEL
BUN: 15 mg/dL (ref 6–23)
CHLORIDE: 104 meq/L (ref 96–112)
CO2: 26 meq/L (ref 19–32)
CREATININE: 0.9 mg/dL (ref 0.4–1.5)
Calcium: 8.5 mg/dL (ref 8.4–10.5)
GFR: 87.98 mL/min (ref >60.00)
Glucose, Bld: 103 mg/dL — ABNORMAL HIGH (ref 70–99)
POTASSIUM: 3.1 meq/L — AB (ref 3.5–5.1)
SODIUM: 138 meq/L (ref 135–145)

## 2013-11-17 ENCOUNTER — Ambulatory Visit: Payer: Federal, State, Local not specified - PPO | Admitting: Internal Medicine

## 2013-11-24 ENCOUNTER — Ambulatory Visit: Payer: Federal, State, Local not specified - PPO | Admitting: Internal Medicine

## 2013-11-24 ENCOUNTER — Ambulatory Visit (INDEPENDENT_AMBULATORY_CARE_PROVIDER_SITE_OTHER): Payer: Federal, State, Local not specified - PPO | Admitting: Internal Medicine

## 2013-11-24 ENCOUNTER — Encounter: Payer: Self-pay | Admitting: Internal Medicine

## 2013-11-24 VITALS — BP 158/80 | HR 62 | Temp 98.0°F | Ht 68.0 in | Wt 215.0 lb

## 2013-11-24 DIAGNOSIS — E611 Iron deficiency: Secondary | ICD-10-CM | POA: Insufficient documentation

## 2013-11-24 DIAGNOSIS — D509 Iron deficiency anemia, unspecified: Secondary | ICD-10-CM

## 2013-11-24 DIAGNOSIS — I10 Essential (primary) hypertension: Secondary | ICD-10-CM

## 2013-11-24 DIAGNOSIS — Z789 Other specified health status: Secondary | ICD-10-CM

## 2013-11-24 DIAGNOSIS — F1090 Alcohol use, unspecified, uncomplicated: Secondary | ICD-10-CM | POA: Insufficient documentation

## 2013-11-24 DIAGNOSIS — R070 Pain in throat: Secondary | ICD-10-CM

## 2013-11-24 DIAGNOSIS — G8929 Other chronic pain: Secondary | ICD-10-CM

## 2013-11-24 DIAGNOSIS — E876 Hypokalemia: Secondary | ICD-10-CM

## 2013-11-24 LAB — BASIC METABOLIC PANEL
BUN: 14 mg/dL (ref 6–23)
CALCIUM: 8.9 mg/dL (ref 8.4–10.5)
CO2: 28 mEq/L (ref 19–32)
Chloride: 101 mEq/L (ref 96–112)
Creatinine, Ser: 1 mg/dL (ref 0.4–1.5)
GFR: 80.9 mL/min (ref >60.00)
Glucose, Bld: 86 mg/dL (ref 70–99)
POTASSIUM: 3.2 meq/L — AB (ref 3.5–5.1)
SODIUM: 136 meq/L (ref 135–145)

## 2013-11-24 LAB — MAGNESIUM: MAGNESIUM: 2 mg/dL (ref 1.5–2.5)

## 2013-11-24 NOTE — Patient Instructions (Addendum)
Intensify lifestyle interventions. Decrease etoh.  Will let you know potassium level and plan but may need to take double dose.  Better diet =could help Acid reflux   Could add to sore throat.  Consider changing nexium  .if not better  Check bp readings occasionally  ROV  in -2 months   Labs depending on this .

## 2013-11-24 NOTE — Progress Notes (Signed)
Chief Complaint  Patient presents with  . Follow-up    Lab work    HPI: Patient comes in today for follow up of  multiple medical problems.  Had run out of potassium and couldn't find it until later . Was off for a few weeks and then started back on 29meq per day for a week before getting labs Taking bp med Not checking readings  Loose stools  Not diarrhea. All the time seems to be baseleing no vomiting or systemic sx .  Donating blood regularly Check ear  has sore throat .  Right seen a year ago had ent check  Neg eval  On ppi stable no hb at present.  ROS: See pertinent positives and negatives per HPI.no cough depression  Past Medical History  Diagnosis Date  . OBESITY 08/09/2010  . IRON DEFICIENCY 07/18/2010  . DEPRESSION 12/02/2008  . HYPERTENSION 12/02/2008  . GERD 12/02/2008     no endo  . SNORING 07/18/2010  . HYPERGLYCEMIA, MILD 01/19/2010  . 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 Muscle fasciculation 11/14/2010  . Muscle twitch 11/14/2010    Left rm  Poss overuse  R/o metabolic      Family History  Problem Relation Age of Onset  . Stroke Mother     1  . Depression Mother   . Hypertension Father   . Heart disease Father     25  . Alcohol abuse Father   . Sleep apnea Sister     History   Social History  . Marital Status: Married    Spouse Name: N/A    Number of Children: N/A  . Years of Education: N/A   Social History Main Topics  . Smoking status: Former Research scientist (life sciences)  . Smokeless tobacco: Never Used  . Alcohol Use: 3.0 oz/week    6 drink(s) per week  . Drug Use: No  . Sexual Activity: None   Other Topics Concern  . 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   Originally from [place]  To [place] of 2 dogs and cats              Outpatient Encounter Prescriptions as  of 11/24/2013  Medication Sig  . amLODipine (NORVASC) 10 MG tablet TAKE 1 TABLET DAILY  . CIALIS 5 MG tablet TAKE 1 TABLET DAILY FOR    BENIGN PROSTATIC           HYPERTROPHY  . escitalopram (LEXAPRO) 10 MG tablet TAKE 1 TABLET DAILY  . escitalopram (LEXAPRO) 10 MG tablet TAKE 1 TABLET DAILY  . esomeprazole (NEXIUM) 40 MG capsule Take 1 capsule (40 mg total) by mouth daily before breakfast.  . KLOR-CON M20 20 MEQ tablet TAKE 1 TABLET TWICE A DAY  . valsartan-hydrochlorothiazide (DIOVAN-HCT) 320-25 MG per tablet TAKE 1 TABLET DAILY  . fluticasone (FLONASE) 50 MCG/ACT nasal spray Place 2 sprays into the nose daily.    EXAM:  BP 158/80  Pulse 62  Temp(Src) 98 F (36.7 C) (Oral)  Ht 5\' 8"  (1.727 m)  Wt 215 lb (97.523 kg)  BMI 32.70 kg/m2  SpO2 97%  Body mass index is 32.7 kg/(m^2).  GENERAL: vitals reviewed and listed above, alert, oriented, appears well hydrated  and in no acute distress  HEENT: atraumatic, conjunctiva  clear, no obvious abnormalities on inspection of external nose and ears OP : no lesion edema or exudate  No adenopahty op no obv lesion ears noacute change  Slight wax in eac  NECK: no obvious masses on inspection palpation  LUNGS: clear to auscultation bilaterally, no wheezes, rales or rhonchi, good air movement CV: HRRR, no clubbing cyanosis or  peripheral edema nl cap refill  MS: moves all extremities without noticeable focal  abnormality PSYCH: pleasant and cooperative, no obvious depression or anxiety Lab Results  Component Value Date   WBC 5.4 01/27/2013   HGB 14.3 01/27/2013   HCT 41.4 01/27/2013   PLT 255.0 01/27/2013   GLUCOSE 86 11/24/2013   CHOL 187 07/29/2012   TRIG 186.0* 07/29/2012   HDL 34.30* 07/29/2012   LDLCALC 116* 07/29/2012   ALT 28 07/29/2012   AST 21 07/29/2012   NA 136 11/24/2013   K 3.2* 11/24/2013   CL 101 11/24/2013   CREATININE 1.0 11/24/2013   BUN 14 11/24/2013   CO2 28 11/24/2013   TSH 2.49 07/29/2012   PSA 1.83 07/29/2012   HGBA1C  6.0 01/27/2013   MICROALBUR 1.7 07/08/2010   Wt Readings from Last 3 Encounters:  11/24/13 215 lb (97.523 kg)  09/10/13 215 lb (97.523 kg)  08/27/13 215 lb (97.523 kg)     ASSESSMENT AND PLAN:  Discussed the following assessment and plan:  Unspecified essential hypertension - affected by ls on 3 med regimen to   Intensify LSI and replace potassium and close fu.  - Plan: Basic metabolic panel, Magnesium  Hypokalemia - was out of med for a while also loose stools etc could be adding to this - Plan: Basic metabolic panel, Magnesium  Iron deficiency - donates blood regularlu   follow no gi sx otherwise and utd on colonsocopy .  - Plan: Basic metabolic panel, Magnesium  Heavy alcohol use - will cut back on beer and help lose weight  Chronic throat pain - neg ent check inpast poss ELR lsi consdier change PPI  Discussed options of adding other medications are changing and he feels like lifestyle intervention would be helpful and will followup closely. Check potassium level today may need to increase the dose and plan followup. -Patient advised to return or notify health care team  if symptoms worsen or persist or new concerns arise.  Patient Instructions  Intensify lifestyle interventions. Decrease etoh.  Will let you know potassium level and plan but may need to take double dose.  Better diet =could help Acid reflux   Could add to sore throat.  Consider changing nexium  .if not better  Check bp readings occasionally  ROV  in -2 months   Labs depending on this .     Standley Brooking. Harvel Meskill M.D.  Pre visit review using our clinic review tool, if applicable. No additional management support is needed unless otherwise documented below in the visit note.

## 2013-12-08 ENCOUNTER — Other Ambulatory Visit: Payer: Self-pay | Admitting: Internal Medicine

## 2013-12-15 ENCOUNTER — Other Ambulatory Visit (INDEPENDENT_AMBULATORY_CARE_PROVIDER_SITE_OTHER): Payer: Federal, State, Local not specified - PPO

## 2013-12-15 DIAGNOSIS — I10 Essential (primary) hypertension: Secondary | ICD-10-CM

## 2013-12-15 LAB — BASIC METABOLIC PANEL
BUN: 15 mg/dL (ref 6–23)
CHLORIDE: 106 meq/L (ref 96–112)
CO2: 28 mEq/L (ref 19–32)
Calcium: 8.6 mg/dL (ref 8.4–10.5)
Creatinine, Ser: 0.9 mg/dL (ref 0.4–1.5)
GFR: 91.34 mL/min (ref >60.00)
Glucose, Bld: 133 mg/dL — ABNORMAL HIGH (ref 70–99)
POTASSIUM: 3.2 meq/L — AB (ref 3.5–5.1)
Sodium: 140 mEq/L (ref 135–145)

## 2013-12-25 ENCOUNTER — Other Ambulatory Visit: Payer: Self-pay | Admitting: Family Medicine

## 2013-12-31 ENCOUNTER — Other Ambulatory Visit: Payer: Self-pay | Admitting: Internal Medicine

## 2014-01-12 ENCOUNTER — Other Ambulatory Visit (INDEPENDENT_AMBULATORY_CARE_PROVIDER_SITE_OTHER): Payer: Federal, State, Local not specified - PPO

## 2014-01-12 DIAGNOSIS — I10 Essential (primary) hypertension: Secondary | ICD-10-CM

## 2014-01-12 LAB — BASIC METABOLIC PANEL
BUN: 18 mg/dL (ref 6–23)
CALCIUM: 8.7 mg/dL (ref 8.4–10.5)
CO2: 28 meq/L (ref 19–32)
Chloride: 102 mEq/L (ref 96–112)
Creatinine, Ser: 1 mg/dL (ref 0.4–1.5)
GFR: 80.86 mL/min (ref >60.00)
GLUCOSE: 97 mg/dL (ref 70–99)
POTASSIUM: 3.6 meq/L (ref 3.5–5.1)
SODIUM: 139 meq/L (ref 135–145)

## 2014-01-26 ENCOUNTER — Encounter: Payer: Self-pay | Admitting: Internal Medicine

## 2014-01-26 ENCOUNTER — Ambulatory Visit (INDEPENDENT_AMBULATORY_CARE_PROVIDER_SITE_OTHER): Payer: Federal, State, Local not specified - PPO | Admitting: Internal Medicine

## 2014-01-26 VITALS — BP 150/82 | Temp 99.8°F | Ht 68.0 in | Wt 216.0 lb

## 2014-01-26 DIAGNOSIS — R070 Pain in throat: Secondary | ICD-10-CM

## 2014-01-26 DIAGNOSIS — E876 Hypokalemia: Secondary | ICD-10-CM

## 2014-01-26 DIAGNOSIS — K219 Gastro-esophageal reflux disease without esophagitis: Secondary | ICD-10-CM

## 2014-01-26 DIAGNOSIS — I10 Essential (primary) hypertension: Secondary | ICD-10-CM

## 2014-01-26 NOTE — Progress Notes (Signed)
Pre visit review using our clinic review tool, if applicable. No additional management support is needed unless otherwise documented below in the visit note. 

## 2014-01-26 NOTE — Patient Instructions (Signed)
Intensify lifestyle interventions. Check bp readings  At home. At least 3 x per week. And record them .  Consideration of adding medication and or  considering evaluation for  Undiagnosed sleep apnea.  return office visit in 3 months  BMP pre visit .  Trial   Of flonase or nasacort.   Every day for 2 weeks to see if helps the throat sx .

## 2014-01-26 NOTE — Progress Notes (Signed)
Chief Complaint  Patient presents with  . Follow-up    HPI: Jaime Orr  comes in today for follow up of  multiple medical problems.  Bp and potassium now on 60 meq per day  Feels fine . Feels could eat better  And doest really make healthy schoices otherwise . Sleeps 9-10 hours feel rested only ocass awakens gasping for air doesn't think has persistent osa neg fam hx  This weekend  fri 6-8 beers none rest of weekend.  No cp sob taking nexium to help gerd. Throat neck area and drainage still aware but no t progressed  ( had ent eval in past and nl)  ROS: See pertinent positives and negatives per HPI.  Past Medical History  Diagnosis Date  . OBESITY 08/09/2010  . IRON DEFICIENCY 07/18/2010  . DEPRESSION 12/02/2008  . HYPERTENSION 12/02/2008  . GERD 12/02/2008     no endo  . SNORING 07/18/2010  . HYPERGLYCEMIA, MILD 01/19/2010  . 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 Muscle fasciculation 11/14/2010  . Muscle twitch 11/14/2010    Left rm  Poss overuse  R/o metabolic      Family History  Problem Relation Age of Onset  . Stroke Mother     25  . Depression Mother   . Hypertension Father   . Heart disease Father     81  . Alcohol abuse Father   . Sleep apnea Sister     History   Social History  . Marital Status: Married    Spouse Name: N/A    Number of Children: N/A  . Years of Education: N/A   Social History Main Topics  . Smoking status: Former Research scientist (life sciences)  . Smokeless tobacco: Never Used  . Alcohol Use: 3.0 oz/week    6 drink(s) per week  . Drug Use: No  . Sexual Activity: None   Other Topics Concern  . 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   Originally from [place]  To [place] of 2 dogs and cats              Outpatient Encounter Prescriptions as of 01/26/2014    Medication Sig  . amLODipine (NORVASC) 10 MG tablet TAKE 1 TABLET DAILY  . CIALIS 5 MG tablet TAKE 1 TABLET DAILY FOR    BENIGN PROSTATIC           HYPERTROPHY  . escitalopram (LEXAPRO) 10 MG tablet TAKE 1 TABLET DAILY  . esomeprazole (NEXIUM) 40 MG capsule Take 1 capsule (40 mg total) by mouth daily before breakfast.  . potassium chloride SA (KLOR-CON M20) 20 MEQ tablet Take 81meq daily  . valsartan-hydrochlorothiazide (DIOVAN-HCT) 320-25 MG per tablet TAKE 1 TABLET DAILY  . [DISCONTINUED] escitalopram (LEXAPRO) 10 MG tablet TAKE 1 TABLET DAILY  . [DISCONTINUED] fluticasone (FLONASE) 50 MCG/ACT nasal spray Place 2 sprays into the nose daily.    EXAM:  BP 160/80  Temp(Src) 99.8 F (37.7 C) (Oral)  Ht 5\' 8"  (1.727 m)  Wt 216 lb (97.977 kg)  BMI 32.85 kg/m2  Body mass index is 32.85 kg/(m^2). Repeat bp readings right x 2 large 150 148/82  Left 144/80 after sitting for 5-8 minutes  GENERAL: vitals reviewed and  listed above, alert, oriented, appears well hydrated and in no acute distress HEENT: atraumatic, conjunctiva  clear, no obvious abnormalities on inspection of external nose and ears OP : no lesion edema or exudate  tms clear  NECK: no obvious masses on inspection palpation  LUNGS: clear to auscultation bilaterally, no wheezes, rales or rhonchi,  CV: HRRR, no clubbing cyanosis or  peripheral edema nl cap refill  MS: moves all extremities without noticeable focal  abnormality PSYCH: pleasant and cooperative, no obvious depression or anxiety Lab Results  Component Value Date   WBC 5.4 01/27/2013   HGB 14.3 01/27/2013   HCT 41.4 01/27/2013   PLT 255.0 01/27/2013   GLUCOSE 97 01/12/2014   CHOL 187 07/29/2012   TRIG 186.0* 07/29/2012   HDL 34.30* 07/29/2012   LDLCALC 116* 07/29/2012   ALT 28 07/29/2012   AST 21 07/29/2012   NA 139 01/12/2014   K 3.6 01/12/2014   CL 102 01/12/2014   CREATININE 1.0 01/12/2014   BUN 18 01/12/2014   CO2 28 01/12/2014   TSH 2.49 07/29/2012   PSA 1.83  07/29/2012   HGBA1C 6.0 01/27/2013   MICROALBUR 1.7 07/08/2010    ASSESSMENT AND PLAN:  Discussed the following assessment and plan:  Unspecified essential hypertension - poss resistant  require high pot supp to maintain but ls is not favorable either disc further val but pt wants to do better with lsifirst ,  Hypokalemia - improved   Esophageal reflux - stable on  nesium   Throat discomfort - r hx of same neg ent check try flonase ( we are in allergy season) To bring in monitor and readings !  Respect decision to wait on checking into osa or other poss underlying causes of "resistant ht)  -Patient advised to return or notify health care team  if symptoms worsen ,persist or new concerns arise.  Patient Instructions  Intensify lifestyle interventions. Check bp readings  At home. At least 3 x per week. And record them .  Consideration of adding medication and or  considering evaluation for  Undiagnosed sleep apnea.  return office visit in 3 months  BMP pre visit .  Trial   Of flonase or nasacort.   Every day for 2 weeks to see if helps the throat sx .    Standley Brooking. Panosh M.D.

## 2014-01-27 ENCOUNTER — Telehealth: Payer: Self-pay | Admitting: Internal Medicine

## 2014-01-27 NOTE — Telephone Encounter (Signed)
Relevant patient education assigned to patient using Emmi. ° °

## 2014-03-11 ENCOUNTER — Other Ambulatory Visit: Payer: Self-pay | Admitting: Internal Medicine

## 2014-04-20 ENCOUNTER — Other Ambulatory Visit (INDEPENDENT_AMBULATORY_CARE_PROVIDER_SITE_OTHER): Payer: Federal, State, Local not specified - PPO

## 2014-04-20 DIAGNOSIS — I10 Essential (primary) hypertension: Secondary | ICD-10-CM

## 2014-04-20 LAB — BASIC METABOLIC PANEL
BUN: 23 mg/dL (ref 6–23)
CHLORIDE: 103 meq/L (ref 96–112)
CO2: 30 meq/L (ref 19–32)
CREATININE: 1.1 mg/dL (ref 0.4–1.5)
Calcium: 8.9 mg/dL (ref 8.4–10.5)
GFR: 72.38 mL/min (ref >60.00)
Glucose, Bld: 117 mg/dL — ABNORMAL HIGH (ref 70–99)
POTASSIUM: 3.5 meq/L (ref 3.5–5.1)
Sodium: 140 mEq/L (ref 135–145)

## 2014-04-27 ENCOUNTER — Ambulatory Visit (INDEPENDENT_AMBULATORY_CARE_PROVIDER_SITE_OTHER): Payer: Federal, State, Local not specified - PPO | Admitting: Internal Medicine

## 2014-04-27 ENCOUNTER — Encounter: Payer: Self-pay | Admitting: Internal Medicine

## 2014-04-27 VITALS — BP 138/78 | Temp 98.8°F | Ht 68.0 in | Wt 221.0 lb

## 2014-04-27 DIAGNOSIS — I10 Essential (primary) hypertension: Secondary | ICD-10-CM

## 2014-04-27 DIAGNOSIS — R0609 Other forms of dyspnea: Secondary | ICD-10-CM

## 2014-04-27 DIAGNOSIS — F1090 Alcohol use, unspecified, uncomplicated: Secondary | ICD-10-CM

## 2014-04-27 DIAGNOSIS — R0989 Other specified symptoms and signs involving the circulatory and respiratory systems: Secondary | ICD-10-CM

## 2014-04-27 DIAGNOSIS — E876 Hypokalemia: Secondary | ICD-10-CM

## 2014-04-27 DIAGNOSIS — Z789 Other specified health status: Secondary | ICD-10-CM

## 2014-04-27 MED ORDER — POTASSIUM CHLORIDE CRYS ER 20 MEQ PO TBCR: 20.0000 meq | EXTENDED_RELEASE_TABLET | Freq: Once | ORAL | Status: DC

## 2014-04-27 NOTE — Progress Notes (Signed)
Pre visit review using our clinic review tool, if applicable. No additional management support is needed unless otherwise documented below in the visit note.  Chief Complaint  Patient presents with  . Follow-up    HPI: Fu ht potassium readings   Only  Taking 20 meq per day  For a month  Of the potassium  because of    takin pills all at one time got eophageal issues  Ok now Still drinking    Says doesn't need help know needs to lose weight   Bp not checking :as said he would  No really depressed sleeps a lot gasps for breath at times but says  doesn't want sleep apnea rx so doesn't want the test.  ROS: See pertinent positives and negatives per HPI.no cp sob  Had skin cancer removed and under surveillance . Had a few on the back of neck also   Past Medical History  Diagnosis Date  . OBESITY 08/09/2010  . IRON DEFICIENCY 07/18/2010  . DEPRESSION 12/02/2008  . HYPERTENSION 12/02/2008  . GERD 12/02/2008     no endo  . SNORING 07/18/2010  . HYPERGLYCEMIA, MILD 01/19/2010  . 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 Muscle fasciculation 11/14/2010  . Muscle twitch 11/14/2010    Left rm  Poss overuse  R/o metabolic      Family History  Problem Relation Age of Onset  . Stroke Mother     83  . Depression Mother   . Hypertension Father   . Heart disease Father     76  . Alcohol abuse Father   . Sleep apnea Sister     History   Social History  . Marital Status: Married    Spouse Name: N/A    Number of Children: N/A  . Years of Education: N/A   Social History Main Topics  . Smoking status: Former Research scientist (life sciences)  . Smokeless tobacco: Never Used  . Alcohol Use: 3.0 oz/week    6 drink(s) per week  . Drug Use: No  . Sexual Activity: None   Other Topics Concern  . 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   Originally from [place]  To [place] of 2 dogs and cats              Outpatient Encounter Prescriptions as of 04/27/2014  Medication Sig  . amLODipine (NORVASC) 10 MG tablet TAKE 1 TABLET DAILY  . CIALIS 5 MG tablet TAKE 1 TABLET DAILY FOR    BENIGN PROSTATIC           HYPERTROPHY  . escitalopram (LEXAPRO) 10 MG tablet TAKE 1 TABLET DAILY  . esomeprazole (NEXIUM) 40 MG capsule Take 1 capsule (40 mg total) by mouth daily before breakfast.  . valsartan-hydrochlorothiazide (DIOVAN-HCT) 320-25 MG per tablet TAKE 1 TABLET DAILY  . [DISCONTINUED] potassium chloride SA (KLOR-CON M20) 20 MEQ tablet Take 86meq daily  . potassium chloride SA (K-DUR,KLOR-CON) 20 MEQ tablet Take 1-2 tablets (20-40 mEq total) by mouth once.    EXAM:  BP 138/78  Temp(Src) 98.8 F (37.1 C) (Oral)  Ht 5\' 8"  (1.727 m)  Wt 221 lb (100.245 kg)  BMI 33.61 kg/m2  Body mass index is 33.61 kg/(m^2). 140/82 right  larger 138/78 left sitting GENERAL: vitals reviewed and listed above, alert, oriented, appears well hydrated and in no acute distress  good air movement CV: HRRR, no clubbing cyanosis or  peripheral edema nl cap refill  MS: moves all extremities without noticeable focal  Abnormality Skin  Some ak changes on forearms.  PSYCH: pleasant and cooperative, no obvious depression or anxiety Lab Results  Component Value Date   WBC 5.4 01/27/2013   HGB 14.3 01/27/2013   HCT 41.4 01/27/2013   PLT 255.0 01/27/2013   GLUCOSE 117* 04/20/2014   CHOL 187 07/29/2012   TRIG 186.0* 07/29/2012   HDL 34.30* 07/29/2012   LDLCALC 116* 07/29/2012   ALT 28 07/29/2012   AST 21 07/29/2012   NA 140 04/20/2014   K 3.5 04/20/2014   CL 103 04/20/2014   CREATININE 1.1 04/20/2014   BUN 23 04/20/2014   CO2 30 04/20/2014   TSH 2.49 07/29/2012   PSA 1.83 07/29/2012   HGBA1C 6.0 01/27/2013   MICROALBUR 1.7 07/08/2010   Wt Readings from Last 3 Encounters:  04/27/14 221 lb (100.245 kg)  01/26/14 216 lb (97.977 kg)  11/24/13 215 lb  (97.523 kg)     ASSESSMENT AND PLAN:  Discussed the following assessment and plan:  HYPERTENSION  Hypokalemia  Heavy alcohol use - pt aware  contirbutin to weight gain and  poss osa sx   SNORING - has some osa sx pt doesnt want evaluation will lose weight Wellness in late fall with a1c  bg this time was not fasting.  -Patient advised to return or notify health care team  if symptoms worsen ,persist or new concerns arise.  Patient Instructions  Continue   Potassium supplement and other meds  Healthy lifestyle includes : At least 150 minutes of exercise weeks  , weight at healthy levels, which is usually   BMI 19-25. Avoid trans fats and processed foods;  Increase fresh fruits and veges to 5 servings per day. And avoid sweet beverages including tea and juice. Mediterranean diet with olive oil and nuts have been noted to be heart and brain healthy . Avoid tobacco products . Limit  alcohol to  7 per week for women and 14 servings for men.  Get adequate sleep .  You may have sleep apnea.   By history and i would suggest consider seeing  Sleep specialist but losing weight    May help  This .     Plan  Wellness visit  In  November / December .full set of labs  And hga1c      Standley Brooking. Sharley Keeler M.D. Total visit 17mins > 50% spent counseling and coordinating care

## 2014-04-27 NOTE — Patient Instructions (Signed)
Continue   Potassium supplement and other meds  Healthy lifestyle includes : At least 150 minutes of exercise weeks  , weight at healthy levels, which is usually   BMI 19-25. Avoid trans fats and processed foods;  Increase fresh fruits and veges to 5 servings per day. And avoid sweet beverages including tea and juice. Mediterranean diet with olive oil and nuts have been noted to be heart and brain healthy . Avoid tobacco products . Limit  alcohol to  7 per week for women and 14 servings for men.  Get adequate sleep .  You may have sleep apnea.   By history and i would suggest consider seeing  Sleep specialist but losing weight    May help  This .     Plan  Wellness visit  In  November / December .full set of labs  And hga1c

## 2014-05-20 ENCOUNTER — Other Ambulatory Visit: Payer: Self-pay | Admitting: Internal Medicine

## 2014-05-20 NOTE — Telephone Encounter (Signed)
Sent to the pharmacy by e-scribe. 

## 2014-07-17 ENCOUNTER — Other Ambulatory Visit: Payer: Self-pay

## 2014-08-31 ENCOUNTER — Other Ambulatory Visit: Payer: Federal, State, Local not specified - PPO

## 2014-09-07 ENCOUNTER — Encounter: Payer: Federal, State, Local not specified - PPO | Admitting: Internal Medicine

## 2014-09-09 ENCOUNTER — Other Ambulatory Visit (INDEPENDENT_AMBULATORY_CARE_PROVIDER_SITE_OTHER): Payer: Federal, State, Local not specified - PPO

## 2014-09-09 DIAGNOSIS — Z0000 Encounter for general adult medical examination without abnormal findings: Secondary | ICD-10-CM

## 2014-09-09 LAB — COMPREHENSIVE METABOLIC PANEL
ALT: 25 U/L (ref 0–53)
AST: 17 U/L (ref 0–37)
Albumin: 3.9 g/dL (ref 3.5–5.2)
Alkaline Phosphatase: 57 U/L (ref 39–117)
BILIRUBIN TOTAL: 0.5 mg/dL (ref 0.2–1.2)
BUN: 20 mg/dL (ref 6–23)
CHLORIDE: 98 meq/L (ref 96–112)
CO2: 27 mEq/L (ref 19–32)
CREATININE: 0.9 mg/dL (ref 0.4–1.5)
Calcium: 9.1 mg/dL (ref 8.4–10.5)
GFR: 87.73 mL/min (ref >60.00)
Glucose, Bld: 142 mg/dL — ABNORMAL HIGH (ref 70–99)
Potassium: 4 mEq/L (ref 3.5–5.1)
Sodium: 132 mEq/L — ABNORMAL LOW (ref 135–145)
Total Protein: 6.8 g/dL (ref 6.0–8.3)

## 2014-09-09 LAB — CBC WITH DIFFERENTIAL/PLATELET
BASOS ABS: 0 10*3/uL (ref 0.0–0.1)
Basophils Relative: 0 % (ref 0.0–3.0)
Eosinophils Absolute: 0 10*3/uL (ref 0.0–0.7)
Eosinophils Relative: 0 % (ref 0.0–5.0)
HCT: 43 % (ref 39.0–52.0)
HEMOGLOBIN: 14.5 g/dL (ref 13.0–17.0)
LYMPHS ABS: 0.7 10*3/uL (ref 0.7–4.0)
Lymphocytes Relative: 4.7 % — ABNORMAL LOW (ref 12.0–46.0)
MCHC: 33.8 g/dL (ref 30.0–36.0)
MCV: 87.7 fl (ref 78.0–100.0)
Monocytes Absolute: 0.3 10*3/uL (ref 0.1–1.0)
Monocytes Relative: 2.2 % — ABNORMAL LOW (ref 3.0–12.0)
NEUTROS ABS: 13.3 10*3/uL — AB (ref 1.4–7.7)
Neutrophils Relative %: 93.1 % — ABNORMAL HIGH (ref 43.0–77.0)
PLATELETS: 321 10*3/uL (ref 150.0–400.0)
RBC: 4.9 Mil/uL (ref 4.22–5.81)
RDW: 13.3 % (ref 11.5–15.5)
WBC: 14.3 10*3/uL — ABNORMAL HIGH (ref 4.0–10.5)

## 2014-09-09 LAB — LIPID PANEL
CHOL/HDL RATIO: 4
Cholesterol: 223 mg/dL — ABNORMAL HIGH (ref 0–200)
HDL: 59.3 mg/dL (ref >39.00)
LDL Cholesterol: 151 mg/dL — ABNORMAL HIGH (ref 0–99)
NONHDL: 163.7
Triglycerides: 66 mg/dL (ref 0.0–149.0)
VLDL: 13.2 mg/dL (ref 0.0–40.0)

## 2014-09-09 LAB — TSH: TSH: 1.39 u[IU]/mL (ref 0.35–4.50)

## 2014-09-09 LAB — PSA: PSA: 1.81 ng/mL (ref 0.10–4.00)

## 2014-09-15 ENCOUNTER — Other Ambulatory Visit: Payer: Federal, State, Local not specified - PPO

## 2014-09-22 ENCOUNTER — Ambulatory Visit (INDEPENDENT_AMBULATORY_CARE_PROVIDER_SITE_OTHER): Payer: Federal, State, Local not specified - PPO | Admitting: Internal Medicine

## 2014-09-22 ENCOUNTER — Encounter: Payer: Self-pay | Admitting: Internal Medicine

## 2014-09-22 ENCOUNTER — Other Ambulatory Visit: Payer: Self-pay | Admitting: Internal Medicine

## 2014-09-22 VITALS — BP 138/76 | Temp 98.9°F | Ht 67.5 in | Wt 214.3 lb

## 2014-09-22 DIAGNOSIS — Z789 Other specified health status: Secondary | ICD-10-CM

## 2014-09-22 DIAGNOSIS — Z0000 Encounter for general adult medical examination without abnormal findings: Secondary | ICD-10-CM

## 2014-09-22 DIAGNOSIS — F1090 Alcohol use, unspecified, uncomplicated: Secondary | ICD-10-CM

## 2014-09-22 DIAGNOSIS — D72829 Elevated white blood cell count, unspecified: Secondary | ICD-10-CM

## 2014-09-22 DIAGNOSIS — K219 Gastro-esophageal reflux disease without esophagitis: Secondary | ICD-10-CM

## 2014-09-22 DIAGNOSIS — I10 Essential (primary) hypertension: Secondary | ICD-10-CM

## 2014-09-22 DIAGNOSIS — Z6833 Body mass index (BMI) 33.0-33.9, adult: Secondary | ICD-10-CM

## 2014-09-22 DIAGNOSIS — Z720 Tobacco use: Secondary | ICD-10-CM

## 2014-09-22 DIAGNOSIS — R7301 Impaired fasting glucose: Secondary | ICD-10-CM

## 2014-09-22 NOTE — Patient Instructions (Signed)
Intensify lifestyle interventions. Stopping tobacco is heatlhy for your arteries and to prevent  Stroke heart attack and cancer .  Check sugars fasting .  coffee black ok   Sugar was very elevated and could have been the prednisone but could be pre diabetic.  consider adding med if   Still elevated .    Take blood pressure readings twice a day for 7- 10 days and then periodically .To ensure below 140/90   .Send in readings     May need to rearrange your medication.  We can get neurition referral also for the cholesterol  And sugar  Control .       Why follow it? Research shows. . Those who follow the Mediterranean diet have a reduced risk of heart disease  . The diet is associated with a reduced incidence of Parkinson's and Alzheimer's diseases . People following the diet may have longer life expectancies and lower rates of chronic diseases  . The Dietary Guidelines for Americans recommends the Mediterranean diet as an eating plan to promote health and prevent disease  What Is the Mediterranean Diet?  . Healthy eating plan based on typical foods and recipes of Mediterranean-style cooking . The diet is primarily a plant based diet; these foods should make up a majority of meals   Starches - Plant based foods should make up a majority of meals - They are an important sources of vitamins, minerals, energy, antioxidants, and fiber - Choose whole grains, foods high in fiber and minimally processed items  - Typical grain sources include wheat, oats, barley, corn, brown rice, bulgar, farro, millet, polenta, couscous  - Various types of beans include chickpeas, lentils, fava beans, black beans, white beans   Fruits  Veggies - Large quantities of antioxidant rich fruits & veggies; 6 or more servings  - Vegetables can be eaten raw or lightly drizzled with oil and cooked  - Vegetables common to the traditional Mediterranean Diet include: artichokes, arugula, beets, broccoli, brussel sprouts, cabbage,  carrots, celery, collard greens, cucumbers, eggplant, kale, leeks, lemons, lettuce, mushrooms, okra, onions, peas, peppers, potatoes, pumpkin, radishes, rutabaga, shallots, spinach, sweet potatoes, turnips, zucchini - Fruits common to the Mediterranean Diet include: apples, apricots, avocados, cherries, clementines, dates, figs, grapefruits, grapes, melons, nectarines, oranges, peaches, pears, pomegranates, strawberries, tangerines  Fats - Replace butter and margarine with healthy oils, such as olive oil, canola oil, and tahini  - Limit nuts to no more than a handful a day  - Nuts include walnuts, almonds, pecans, pistachios, pine nuts  - Limit or avoid candied, honey roasted or heavily salted nuts - Olives are central to the [employer] - can be eaten whole or used in a variety of dishes   Meats Protein - Limiting red meat: no more than a few times a month - When eating red meat: choose lean cuts and keep the portion to the size of deck of cards - Eggs: approx. 0 to 4 times a week  - Fish and lean poultry: at least 2 a week  - Healthy protein sources include, chicken, [place], lean beef, lamb - Increase intake of seafood such as tuna, salmon, trout, mackerel, shrimp, scallops - Avoid or limit high fat processed meats such as sausage and bacon  Dairy - Include moderate amounts of low fat dairy products  - Focus on healthy dairy such as fat free yogurt, skim milk, low or reduced fat cheese - Limit dairy products higher in fat such as whole or 2% milk,  cheese, ice cream  Alcohol - Moderate amounts of red wine is ok  - No more than 5 oz daily for women (all ages) and men older than age 26  - No more than 10 oz of wine daily for men younger than 51  Other - Limit sweets and other desserts  - Use herbs and spices instead of salt to flavor foods  - Herbs and spices common to the traditional Mediterranean Diet include: basil, bay leaves, chives, cloves, cumin, fennel, garlic, lavender, marjoram,  mint, oregano, parsley, pepper, rosemary, sage, savory, sumac, tarragon, thyme   It's not just a diet, it's a lifestyle:  . The Mediterranean diet includes lifestyle factors typical of those in the region  . Foods, drinks and meals are best eaten with others and savored . Daily physical activity is important for overall good health . This could be strenuous exercise like running and aerobics . This could also be more leisurely activities such as walking, housework, yard-work, or taking the stairs . Moderation is the key; a balanced and healthy diet accommodates most foods and drinks . Consider portion sizes and frequency of consumption of certain foods   Meal Ideas & Options:  . Breakfast:  o Whole wheat toast or whole wheat English muffins with peanut butter & hard boiled egg o Steel cut oats topped with apples & cinnamon and skim milk  o Fresh fruit: banana, strawberries, melon, berries, peaches  o Smoothies: strawberries, bananas, greek yogurt, peanut butter o Low fat greek yogurt with blueberries and granola  o Egg white omelet with spinach and mushrooms o Breakfast couscous: whole wheat couscous, apricots, skim milk, cranberries  . Sandwiches:  o Hummus and grilled vegetables (peppers, zucchini, squash) on whole wheat bread   o Grilled chicken on whole wheat pita with lettuce, tomatoes, cucumbers or tzatziki  o Tuna salad on whole wheat bread: tuna salad made with greek yogurt, olives, red peppers, capers, green onions o Garlic rosemary lamb pita: lamb sauted with garlic, rosemary, salt & pepper; add lettuce, cucumber, greek yogurt to pita - flavor with lemon juice and black pepper  . Seafood:  o Mediterranean grilled salmon, seasoned with garlic, basil, parsley, lemon juice and black pepper o Shrimp, lemon, and spinach whole-grain pasta salad made with low fat greek yogurt  o Seared scallops with lemon orzo  o Seared tuna steaks seasoned salt, pepper, coriander topped with tomato  mixture of olives, tomatoes, olive oil, minced garlic, parsley, green onions and cappers  . Meats:  o Herbed greek chicken salad with kalamata olives, cucumber, feta  o Red bell peppers stuffed with spinach, bulgur, lean ground beef (or lentils) & topped with feta   o Kebabs: skewers of chicken, tomatoes, onions, zucchini, squash  o [place] burgers: made with red onions, mint, dill, lemon juice, feta cheese topped with roasted red peppers . Vegetarian o Cucumber salad: cucumbers, artichoke hearts, celery, red onion, feta cheese, tossed in olive oil & lemon juice  o Hummus and whole grain pita points with a greek salad (lettuce, tomato, feta, olives, cucumbers, red onion) o Lentil soup with celery, carrots made with vegetable broth, garlic, salt and pepper  o Tabouli salad: parsley, bulgur, mint, scallions, cucumbers, tomato, radishes, lemon juice, olive oil, salt and pepper.

## 2014-09-22 NOTE — Progress Notes (Signed)
Pre visit review using our clinic review tool, if applicable. No additional management support is needed unless otherwise documented below in the visit note.  Chief Complaint  Patient presents with  . Annual Exam  . Hypertension  . Hyperlipidemia    HPI: Patient  Jaime Orr  63 y.o. comes in today for Preventive Health Care visit  No major change   Not using   BP monitor yet.  HT takes med not checking No lsi yet. etoh : still heavy drinking  As before Had knee pain and swelling  Cortisone shot in right knee before had labs done helped some ,No fever other change  On nexium   Health Maintenance  Topic Date Due  . ZOSTAVAX  06/26/2013  . INFLUENZA VACCINE  05/03/2015  . TETANUS/TDAP  10/02/2016  . COLONOSCOPY  08/13/2020   Health Maintenance Review LIFESTYLE:  Exercise:   Work active  Tobacco/ETS:  Off and on  About 1/2 ppd  Alcohol: about the same about 2 per day  Sugar beverages:  none Sleep:  About 8-9  No always rested ? Snores   Drug use: no  Colonoscopy: utd   ROS:  GEN/ HEENT: No fever, significant weight changes sweats headaches vision problems hearing changes, CV/ PULM; No chest pain shortness of breath cough, syncope,edema  change in exercise tolerance. GI /GU: No adominal pain, vomiting, change in bowel habits. No blood in the stool. No significant GU symptoms. SKIN/HEME: ,no acute skin rashes suspicious lesions or bleeding. No lymphadenopathy, nodules, masses.  NEURO/ PSYCH:  No neurologic signs such as weakness numbness. No depression anxiety. IMM/ Allergy: No unusual infections.  Allergy .   REST of 12 system review negative except as per HPI   Past Medical History  Diagnosis Date  . OBESITY 08/09/2010  . IRON DEFICIENCY 07/18/2010  . DEPRESSION 12/02/2008  . HYPERTENSION 12/02/2008  . GERD 12/02/2008     no endo  . SNORING 07/18/2010  . HYPERGLYCEMIA, MILD 01/19/2010  . 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 Muscle fasciculation  11/14/2010  . Muscle twitch 11/14/2010    Left rm  Poss overuse  R/o metabolic      Past Surgical History  Procedure Laterality Date  . Knee cartilage surgery  2009    Left Knee   . Shoulder surgery  1994    Left  . Tonsillectomy  1963    Family History  Problem Relation Age of Onset  . Stroke Mother     30  . Depression Mother   . Hypertension Father   . Heart disease Father     37  . Alcohol abuse Father   . Sleep apnea Sister     History   Social History  . Marital Status: Married    Spouse Name: N/A    Number of Children: N/A  . Years of Education: N/A   Social History Main Topics  . Smoking status: Former Research scientist (life sciences)  . Smokeless tobacco: Never Used  . Alcohol Use: 3.0 oz/week    6 drink(s) per week  . Drug Use: No  . Sexual Activity: None   Other Topics Concern  . 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   Originally from [place]  To  [place]   HH of 2 dogs and cats              Outpatient Encounter Prescriptions as of 09/22/2014  Medication Sig  . amLODipine (NORVASC) 10 MG tablet TAKE 1 TABLET DAILY  . CIALIS 5 MG tablet TAKE 1 TABLET DAILY FOR    BENIGN PROSTATIC           HYPERTROPHY  . escitalopram (LEXAPRO) 10 MG tablet TAKE 1 TABLET DAILY  . esomeprazole (NEXIUM) 40 MG capsule Take 1 capsule (40 mg total) by mouth daily before breakfast.  . KLOR-CON M20 20 MEQ tablet TAKE 1 TABLET TWICE A DAY  . valsartan-hydrochlorothiazide (DIOVAN-HCT) 320-25 MG per tablet TAKE 1 TABLET DAILY  . [DISCONTINUED] potassium chloride SA (K-DUR,KLOR-CON) 20 MEQ tablet Take 1-2 tablets (20-40 mEq total) by mouth once.    EXAM:  BP 138/76 mmHg  Temp(Src) 98.9 F (37.2 C) (Oral)  Ht 5' 7.5" (1.715 m)  Wt 214 lb 4.8 oz (97.206 kg)  BMI 33.05 kg/m2  Body mass index is 33.05 kg/(m^2).  Physical  Exam: Vital signs reviewed VQM:GQQP is a well-developed well-nourished alert cooperative    who appearsr stated age in no acute distress.  HEENT: normocephalic atraumatic , Eyes: PERRL EOM's full, conjunctiva clear, Nares: paten,t no deformity discharge or tenderness., Ears: no deformity EAC's clear TMs with normal landmarks. Mouth: clear OP, no lesions, edema.  Moist mucous membranes. Dentition in adequate repair. NECK: supple without masses, thyromegaly or bruits. CHEST/PULM:  Clear to auscultation and percussion breath sounds equal no wheeze , rales or rhonchi. No chest wall deformities or tenderness. CV: PMI is nondisplaced, S1 S2 no gallops, murmurs, rubs. Peripheral pulses are full without delay.No JVD .  ABDOMEN: Bowel sounds normal nontender  No guard or rebound, no hepato splenomegal no CVA tenderness.  No hernia. Extremtities:  No clubbing cyanosis or edema, no acute joint swelling or redness no focal atrophy NEURO:  Oriented x3, cranial nerves 3-12 appear to be intact, no obvious focal weakness,gait within normal limits no abnormal reflexes or asymmetrical SKIN: No acute rashes normal turgor, color, no bruising or petechiae. Small scaly area  PSYCH: Oriented, good eye contact, no obvious depression anxiety, cognition and judgment appear normal. LN: no cervical axillary inguinal adenopathy  Lab Results  Component Value Date   WBC 5.6 09/24/2014   HGB 14.9 09/24/2014   HCT 44.8 09/24/2014   PLT 271.0 09/24/2014   GLUCOSE 108* 09/24/2014   CHOL 223* 09/09/2014   TRIG 66.0 09/09/2014   HDL 59.30 09/09/2014   LDLCALC 151* 09/09/2014   ALT 25 09/09/2014   AST 17 09/09/2014   NA 139 09/24/2014   K 3.4* 09/24/2014   CL 101 09/24/2014   CREATININE 0.9 09/24/2014   BUN 20 09/24/2014   CO2 28 09/24/2014   TSH 1.39 09/09/2014   PSA 1.81 09/09/2014   HGBA1C 6.1 09/24/2014   MICROALBUR 1.7 07/08/2010    ASSESSMENT AND PLAN:  Discussed the following assessment and plan:  Visit  for preventive health examination  Heavy alcohol use - counseled to dec intake  Essential hypertension - better on repeat reading cont meds  lsi weigh tloss dec etoh  Fasting hyperglycemia - in diabeteic range x 1  will plan fasting bg and a1c  lsi   Gastroesophageal reflux disease, esophagitis presence not specified  Elevated WBC count - prob from steroid effect  will recheck no sx of infection  Tobacco use - 1/2 ppd  advised to stop for  health reasons  BMI 33.0-33.9,adult  Patient Care Team: Burnis Medin, MD as PCP - General Ninetta Lights, MD (Orthopedic Surgery) Inda Castle, MD (Gastroenterology) Patient Instructions   Intensify lifestyle interventions. Stopping tobacco is heatlhy for your arteries and to prevent  Stroke heart attack and cancer .  Check sugars fasting .  coffee black ok   Sugar was very elevated and could have been the prednisone but could be pre diabetic.  consider adding med if   Still elevated .    Take blood pressure readings twice a day for 7- 10 days and then periodically .To ensure below 140/90   .Send in readings     May need to rearrange your medication.  We can get neurition referral also for the cholesterol  And sugar  Control .       Why follow it? Research shows. . Those who follow the Mediterranean diet have a reduced risk of heart disease  . The diet is associated with a reduced incidence of Parkinson's and Alzheimer's diseases . People following the diet may have longer life expectancies and lower rates of chronic diseases  . The Dietary Guidelines for Americans recommends the Mediterranean diet as an eating plan to promote health and prevent disease  What Is the Mediterranean Diet?  . Healthy eating plan based on typical foods and recipes of Mediterranean-style cooking . The diet is primarily a plant based diet; these foods should make up a majority of meals   Starches - Plant based foods should make up a majority of meals - They  are an important sources of vitamins, minerals, energy, antioxidants, and fiber - Choose whole grains, foods high in fiber and minimally processed items  - Typical grain sources include wheat, oats, barley, corn, brown rice, bulgar, farro, millet, polenta, couscous  - Various types of beans include chickpeas, lentils, fava beans, black beans, white beans   Fruits  Veggies - Large quantities of antioxidant rich fruits & veggies; 6 or more servings  - Vegetables can be eaten raw or lightly drizzled with oil and cooked  - Vegetables common to the traditional Mediterranean Diet include: artichokes, arugula, beets, broccoli, brussel sprouts, cabbage, carrots, celery, collard greens, cucumbers, eggplant, kale, leeks, lemons, lettuce, mushrooms, okra, onions, peas, peppers, potatoes, pumpkin, radishes, rutabaga, shallots, spinach, sweet potatoes, turnips, zucchini - Fruits common to the Mediterranean Diet include: apples, apricots, avocados, cherries, clementines, dates, figs, grapefruits, grapes, melons, nectarines, oranges, peaches, pears, pomegranates, strawberries, tangerines  Fats - Replace butter and margarine with healthy oils, such as olive oil, canola oil, and tahini  - Limit nuts to no more than a handful a day  - Nuts include walnuts, almonds, pecans, pistachios, pine nuts  - Limit or avoid candied, honey roasted or heavily salted nuts - Olives are central to the [employer] - can be eaten whole or used in a variety of dishes   Meats Protein - Limiting red meat: no more than a few times a month - When eating red meat: choose lean cuts and keep the portion to the size of deck of cards - Eggs: approx. 0 to 4 times a week  - Fish and lean poultry: at least 2 a week  - Healthy protein sources include, chicken, [place], lean beef, lamb - Increase intake of seafood such as tuna, salmon, trout, mackerel, shrimp, scallops - Avoid or limit high fat processed meats such as sausage and bacon    Dairy - Include moderate amounts of low  fat dairy products  - Focus on healthy dairy such as fat free yogurt, skim milk, low or reduced fat cheese - Limit dairy products higher in fat such as whole or 2% milk, cheese, ice cream  Alcohol - Moderate amounts of red wine is ok  - No more than 5 oz daily for women (all ages) and men older than age 40  - No more than 10 oz of wine daily for men younger than 67  Other - Limit sweets and other desserts  - Use herbs and spices instead of salt to flavor foods  - Herbs and spices common to the traditional Mediterranean Diet include: basil, bay leaves, chives, cloves, cumin, fennel, garlic, lavender, marjoram, mint, oregano, parsley, pepper, rosemary, sage, savory, sumac, tarragon, thyme   It's not just a diet, it's a lifestyle:  . The Mediterranean diet includes lifestyle factors typical of those in the region  . Foods, drinks and meals are best eaten with others and savored . Daily physical activity is important for overall good health . This could be strenuous exercise like running and aerobics . This could also be more leisurely activities such as walking, housework, yard-work, or taking the stairs . Moderation is the key; a balanced and healthy diet accommodates most foods and drinks . Consider portion sizes and frequency of consumption of certain foods   Meal Ideas & Options:  . Breakfast:  o Whole wheat toast or whole wheat English muffins with peanut butter & hard boiled egg o Steel cut oats topped with apples & cinnamon and skim milk  o Fresh fruit: banana, strawberries, melon, berries, peaches  o Smoothies: strawberries, bananas, greek yogurt, peanut butter o Low fat greek yogurt with blueberries and granola  o Egg white omelet with spinach and mushrooms o Breakfast couscous: whole wheat couscous, apricots, skim milk, cranberries  . Sandwiches:  o Hummus and grilled vegetables (peppers, zucchini, squash) on whole wheat bread   o Grilled  chicken on whole wheat pita with lettuce, tomatoes, cucumbers or tzatziki  o Tuna salad on whole wheat bread: tuna salad made with greek yogurt, olives, red peppers, capers, green onions o Garlic rosemary lamb pita: lamb sauted with garlic, rosemary, salt & pepper; add lettuce, cucumber, greek yogurt to pita - flavor with lemon juice and black pepper  . Seafood:  o Mediterranean grilled salmon, seasoned with garlic, basil, parsley, lemon juice and black pepper o Shrimp, lemon, and spinach whole-grain pasta salad made with low fat greek yogurt  o Seared scallops with lemon orzo  o Seared tuna steaks seasoned salt, pepper, coriander topped with tomato mixture of olives, tomatoes, olive oil, minced garlic, parsley, green onions and cappers  . Meats:  o Herbed greek chicken salad with kalamata olives, cucumber, feta  o Red bell peppers stuffed with spinach, bulgur, lean ground beef (or lentils) & topped with feta   o Kebabs: skewers of chicken, tomatoes, onions, zucchini, squash  o [place] burgers: made with red onions, mint, dill, lemon juice, feta cheese topped with roasted red peppers . Vegetarian o Cucumber salad: cucumbers, artichoke hearts, celery, red onion, feta cheese, tossed in olive oil & lemon juice  o Hummus and whole grain pita points with a greek salad (lettuce, tomato, feta, olives, cucumbers, red onion) o Lentil soup with celery, carrots made with vegetable broth, garlic, salt and pepper  o Tabouli salad: parsley, bulgur, mint, scallions, cucumbers, tomato, radishes, lemon juice, olive oil, salt and pepper.  Standley Brooking. Nyeli Holtmeyer M.D.

## 2014-09-22 NOTE — Telephone Encounter (Signed)
Also needs a refill nexium 40 mg once daily

## 2014-09-22 NOTE — Telephone Encounter (Signed)
Should patient continue this medication?  Has stopped taking.

## 2014-09-23 ENCOUNTER — Encounter: Payer: Federal, State, Local not specified - PPO | Admitting: Internal Medicine

## 2014-09-24 ENCOUNTER — Other Ambulatory Visit (INDEPENDENT_AMBULATORY_CARE_PROVIDER_SITE_OTHER): Payer: Federal, State, Local not specified - PPO

## 2014-09-24 DIAGNOSIS — R739 Hyperglycemia, unspecified: Secondary | ICD-10-CM

## 2014-09-24 DIAGNOSIS — I10 Essential (primary) hypertension: Secondary | ICD-10-CM

## 2014-09-24 DIAGNOSIS — D649 Anemia, unspecified: Secondary | ICD-10-CM

## 2014-09-24 DIAGNOSIS — R7309 Other abnormal glucose: Secondary | ICD-10-CM

## 2014-09-24 LAB — BASIC METABOLIC PANEL
BUN: 20 mg/dL (ref 6–23)
CHLORIDE: 101 meq/L (ref 96–112)
CO2: 28 mEq/L (ref 19–32)
Calcium: 9 mg/dL (ref 8.4–10.5)
Creatinine, Ser: 0.9 mg/dL (ref 0.4–1.5)
GFR: 86.65 mL/min (ref >60.00)
GLUCOSE: 108 mg/dL — AB (ref 70–99)
POTASSIUM: 3.4 meq/L — AB (ref 3.5–5.1)
Sodium: 139 mEq/L (ref 135–145)

## 2014-09-24 LAB — CBC WITH DIFFERENTIAL/PLATELET
Basophils Absolute: 0 10*3/uL (ref 0.0–0.1)
Basophils Relative: 0.8 % (ref 0.0–3.0)
EOS PCT: 3.4 % (ref 0.0–5.0)
Eosinophils Absolute: 0.2 10*3/uL (ref 0.0–0.7)
HEMATOCRIT: 44.8 % (ref 39.0–52.0)
Hemoglobin: 14.9 g/dL (ref 13.0–17.0)
LYMPHS PCT: 25.8 % (ref 12.0–46.0)
Lymphs Abs: 1.5 10*3/uL (ref 0.7–4.0)
MCHC: 33.2 g/dL (ref 30.0–36.0)
MCV: 89 fl (ref 78.0–100.0)
Monocytes Absolute: 0.5 10*3/uL (ref 0.1–1.0)
Monocytes Relative: 8.2 % (ref 3.0–12.0)
Neutro Abs: 3.5 10*3/uL (ref 1.4–7.7)
Neutrophils Relative %: 61.8 % (ref 43.0–77.0)
Platelets: 271 10*3/uL (ref 150.0–400.0)
RBC: 5.04 Mil/uL (ref 4.22–5.81)
RDW: 13.5 % (ref 11.5–15.5)
WBC: 5.6 10*3/uL (ref 4.0–10.5)

## 2014-09-24 LAB — HEMOGLOBIN A1C: Hgb A1c MFr Bld: 6.1 % (ref 4.6–6.5)

## 2014-09-27 DIAGNOSIS — I10 Essential (primary) hypertension: Secondary | ICD-10-CM | POA: Insufficient documentation

## 2014-09-27 DIAGNOSIS — D72829 Elevated white blood cell count, unspecified: Secondary | ICD-10-CM | POA: Insufficient documentation

## 2014-09-27 DIAGNOSIS — Z720 Tobacco use: Secondary | ICD-10-CM | POA: Insufficient documentation

## 2014-09-27 DIAGNOSIS — R7301 Impaired fasting glucose: Secondary | ICD-10-CM | POA: Insufficient documentation

## 2014-09-28 MED ORDER — ESOMEPRAZOLE MAGNESIUM 40 MG PO CPDR: 40.0000 mg | DELAYED_RELEASE_CAPSULE | Freq: Every day | ORAL | Status: DC

## 2014-09-28 NOTE — Telephone Encounter (Signed)
Sent to the pharmacy by e-scribe. 

## 2014-09-28 NOTE — Telephone Encounter (Signed)
Ok to refill x 3 months  Both

## 2014-11-26 ENCOUNTER — Ambulatory Visit: Payer: Federal, State, Local not specified - PPO | Admitting: Internal Medicine

## 2014-12-04 ENCOUNTER — Encounter: Payer: Self-pay | Admitting: Internal Medicine

## 2014-12-04 ENCOUNTER — Ambulatory Visit (INDEPENDENT_AMBULATORY_CARE_PROVIDER_SITE_OTHER): Payer: Federal, State, Local not specified - PPO | Admitting: Internal Medicine

## 2014-12-04 VITALS — BP 155/80 | Temp 98.5°F | Ht 67.5 in | Wt 220.0 lb

## 2014-12-04 DIAGNOSIS — Z789 Other specified health status: Secondary | ICD-10-CM

## 2014-12-04 DIAGNOSIS — I10 Essential (primary) hypertension: Secondary | ICD-10-CM

## 2014-12-04 DIAGNOSIS — Z720 Tobacco use: Secondary | ICD-10-CM

## 2014-12-04 DIAGNOSIS — F1090 Alcohol use, unspecified, uncomplicated: Secondary | ICD-10-CM

## 2014-12-04 DIAGNOSIS — Z114 Encounter for screening for human immunodeficiency virus [HIV]: Secondary | ICD-10-CM

## 2014-12-04 DIAGNOSIS — R7301 Impaired fasting glucose: Secondary | ICD-10-CM

## 2014-12-04 LAB — BASIC METABOLIC PANEL
BUN: 19 mg/dL (ref 6–23)
CALCIUM: 9.1 mg/dL (ref 8.4–10.5)
CO2: 32 meq/L (ref 19–32)
CREATININE: 0.98 mg/dL (ref 0.40–1.50)
Chloride: 102 mEq/L (ref 96–112)
GFR: 82.52 mL/min (ref >60.00)
GLUCOSE: 106 mg/dL — AB (ref 70–99)
POTASSIUM: 3.8 meq/L (ref 3.5–5.1)
SODIUM: 138 meq/L (ref 135–145)

## 2014-12-04 NOTE — Patient Instructions (Signed)
  Weight loss will help sleep and prob energy  In case you could have sleep apnea.   Will notify you  of labs when available.  Take blood pressure readings twice a day for 10 days and then periodically .To ensure below 140/90   .Send in readings    Send in readings . By my chart .  ROV in 4 -6 months depending on results

## 2014-12-04 NOTE — Progress Notes (Signed)
Pre visit review using our clinic review tool, if applicable. No additional management support is needed unless otherwise documented below in the visit note.  Chief Complaint  Patient presents with  . Follow-up    HPI: Jaime Orr  64 y.o.  Jaime Orr  comes in today for follow up of  multiple medical problems.  With ht  Tobacco use  Heavy etoh use who had elevaetd BG recently   No tobacco this am .  Has none or 2 months   Had recnet  Problem  Cough is better  No sodas and  Limiting bg  No added.  1-2 per day  Dentist  .has some sugar issues had been using sugar candies   In mouth  Donates blood fairly regularly  But never had formal hiv test ( see screening module) would get that  BP taking meds ? Levels  ROS: See pertinent positives and negatives per HPI.  Past Medical History  Diagnosis Date  . OBESITY 08/09/2010  . IRON DEFICIENCY 07/18/2010    doantes blood  . DEPRESSION 12/02/2008  . HYPERTENSION 12/02/2008  . GERD 12/02/2008     no endo  . SNORING 07/18/2010  . HYPERGLYCEMIA, MILD 01/19/2010  . 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 Muscle fasciculation 11/14/2010  . Muscle twitch 11/14/2010    Left rm  Poss overuse  R/o metabolic      Family History  Problem Relation Age of Onset  . Stroke Mother     58  . Depression Mother   . Hypertension Father   . Heart disease Father     69  . Alcohol abuse Father   . Sleep apnea Sister     History   Social History  . Marital Status: Married    Spouse Name: N/A  . Number of Children: N/A  . Years of Education: N/A   Social History Main Topics  . Smoking status: Former Research scientist (life sciences)  . Smokeless tobacco: Never Used  . Alcohol Use: 3.0 oz/week    6 drink(s) per week  . Drug Use: No  . Sexual Activity: Not on file   Other Topics Concern  . 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   Originally from [place]  To [place] of 2 dogs and cats    Donates blood          Outpatient Encounter Prescriptions as of 12/04/2014  Medication Sig  . amLODipine (NORVASC) 10 MG tablet TAKE 1 TABLET DAILY  . CIALIS 5 MG tablet TAKE 1 TABLET DAILY FOR    BENIGN PROSTATIC           HYPERTROPHY  . escitalopram (LEXAPRO) 10 MG tablet TAKE 1 TABLET DAILY  . esomeprazole (NEXIUM) 40 MG capsule Take 1 capsule (40 mg total) by mouth daily before breakfast.  . KLOR-CON M20 20 MEQ tablet TAKE 1 TABLET TWICE A DAY  . valsartan-hydrochlorothiazide (DIOVAN-HCT) 320-25 MG per tablet TAKE 1 TABLET DAILY    EXAM:  BP 155/80 mmHg  Temp(Src) 98.5 F (36.9 C) (Oral)  Ht 5' 7.5" (1.715 m)  Wt 220 lb (99.791 kg)  BMI 33.93 kg/m2  Body mass index is 33.93 kg/(m^2).  GENERAL: vitals reviewed and  listed above, alert, oriented, appears well hydrated and in no acute distress HEENT: atraumatic, conjunctiva  clear, no obvious abnormalities on inspection of external nose and ears PSYCH: pleasant and cooperative, no obvious depression or anxiety Lab Results  Component Value Date   WBC 5.6 09/24/2014   HGB 14.9 09/24/2014   HCT 44.8 09/24/2014   PLT 271.0 09/24/2014   GLUCOSE 106* 12/04/2014   CHOL 223* 09/09/2014   TRIG 66.0 09/09/2014   HDL 59.30 09/09/2014   LDLCALC 151* 09/09/2014   ALT 25 09/09/2014   AST 17 09/09/2014   NA 138 12/04/2014   K 3.8 12/04/2014   CL 102 12/04/2014   CREATININE 0.98 12/04/2014   BUN 19 12/04/2014   CO2 32 12/04/2014   TSH 1.39 09/09/2014   PSA 1.81 09/09/2014   HGBA1C 6.1 09/24/2014   MICROALBUR 1.7 07/08/2010   Wt Readings from Last 3 Encounters:  12/04/14 220 lb (99.791 kg)  09/22/14 214 lb 4.8 oz (97.206 kg)  04/27/14 221 lb (100.245 kg)   BP Readings from Last 3 Encounters:  12/04/14 155/80  09/22/14 138/76  04/27/14 138/78     ASSESSMENT AND PLAN:  Discussed the following  assessment and plan:  Fasting hyperglycemia - Plan: Basic metabolic panel, HIV antibody  Essential hypertension - Plan: Basic metabolic panel, HIV antibody  Resistant hypertension ? - see notes   Tobacco use stopped - continue tobacco free  Heavy alcohol use  Screening for HIV (human immunodeficiency virus) - Plan: HIV antibody Has had weight gain since last visit  Discussed again  Weigh t loss limitetoh for bp control  He is on triple therapy   Has some fatigue and snore but doesn't want to go for sleep eval at this time .  There is a fam hx of osa  Doesn't want to do a machine .... Disc health risk and what he can do   Plans on weight loss and see how sx do. Disc strategies for dec etoh social and not self monitoring  consideration   consider hyper aldo but think that it is life style contributing.  He has donated blood  So risk of hep c and hiv  Is extremely low but can do hiv screening with this blood test.  -Patient advised to return or notify health care team  if symptoms worsen ,persist or new concerns arise. reviewed using my chart about sending in readings  For bp  Total visit 29mins > 50% spent counseling and coordinating care     Patient Instructions   Weight loss will help sleep and prob energy  In case you could have sleep apnea.   Will notify you  of labs when available.  Take blood pressure readings twice a day for 10 days and then periodically .To ensure below 140/90   .Send in readings    Send in readings . By my chart .  ROV in 4 -6 months depending on results     Crystin Lechtenberg K. Havah Ammon M.D.

## 2014-12-05 LAB — HIV ANTIBODY (ROUTINE TESTING W REFLEX): HIV 1&2 Ab, 4th Generation: NONREACTIVE

## 2014-12-06 ENCOUNTER — Encounter: Payer: Self-pay | Admitting: Internal Medicine

## 2014-12-07 ENCOUNTER — Other Ambulatory Visit: Payer: Self-pay | Admitting: Internal Medicine

## 2014-12-07 NOTE — Telephone Encounter (Signed)
Sent to the pharmacy by e-scribe. 

## 2014-12-16 ENCOUNTER — Encounter: Payer: Self-pay | Admitting: Gastroenterology

## 2015-03-13 ENCOUNTER — Other Ambulatory Visit: Payer: Self-pay | Admitting: Internal Medicine

## 2015-03-15 NOTE — Telephone Encounter (Signed)
Sent to the pharmacy by e-scribe. 

## 2015-04-07 ENCOUNTER — Other Ambulatory Visit: Payer: Self-pay | Admitting: Physician Assistant

## 2015-04-13 ENCOUNTER — Other Ambulatory Visit: Payer: Self-pay | Admitting: Physician Assistant

## 2015-04-13 NOTE — H&P (Signed)
Dejour comes in for follow up.  Persistent marked mechanical symptoms medial aspect, right knee.  MRI completed showing a large displaced oblique tear to the medial meniscus as expected.  He did get some transient improvement with Cortisone injection, but it did not last very long.  He comes in to discuss definitive treatment.   History and general exam is outlined, reviewed and included in the chart.   EXAMINATION: Specifically, point tender medial aspect, right knee.  Full motion.  Stable ligaments.  Positive medial McMurray's.    DISPOSITION:  We discussed definitive treatment for his right knee.  Exam under anesthesia, arthroscopy, partial meniscectomy.  He works for the [employer].  Out of work 3-6 weeks depending on rehab and recovery.  Paperwork complete.  All questions answered.  More than 25 minutes spent face-to-face covering all of this with him, reviewing previous x-rays and going over his MRI and report.  He understands and agrees.  See him at the time of operative intervention.  Ninetta Lights, M.D.

## 2015-04-20 ENCOUNTER — Encounter (HOSPITAL_BASED_OUTPATIENT_CLINIC_OR_DEPARTMENT_OTHER): Payer: Self-pay | Admitting: *Deleted

## 2015-04-22 ENCOUNTER — Encounter (HOSPITAL_BASED_OUTPATIENT_CLINIC_OR_DEPARTMENT_OTHER): Payer: Self-pay | Admitting: *Deleted

## 2015-04-22 ENCOUNTER — Ambulatory Visit (HOSPITAL_BASED_OUTPATIENT_CLINIC_OR_DEPARTMENT_OTHER): Payer: Federal, State, Local not specified - PPO | Admitting: Anesthesiology

## 2015-04-22 ENCOUNTER — Encounter (HOSPITAL_BASED_OUTPATIENT_CLINIC_OR_DEPARTMENT_OTHER)
Admission: RE | Disposition: A | Discharge status: Home / Self Care | Payer: Self-pay | Source: Ambulatory Visit | Attending: Orthopedic Surgery

## 2015-04-22 ENCOUNTER — Ambulatory Visit (HOSPITAL_BASED_OUTPATIENT_CLINIC_OR_DEPARTMENT_OTHER)
Admission: RE | Admit: 2015-04-22 | Discharge: 2015-04-22 | Disposition: A | Discharge status: Home / Self Care | Payer: Federal, State, Local not specified - PPO | Source: Ambulatory Visit | Attending: Orthopedic Surgery | Admitting: Orthopedic Surgery

## 2015-04-22 DIAGNOSIS — Z87891 Personal history of nicotine dependence: Secondary | ICD-10-CM | POA: Insufficient documentation

## 2015-04-22 DIAGNOSIS — S83281A Other tear of lateral meniscus, current injury, right knee, initial encounter: Secondary | ICD-10-CM | POA: Diagnosis not present

## 2015-04-22 DIAGNOSIS — X58XXXA Exposure to other specified factors, initial encounter: Secondary | ICD-10-CM | POA: Diagnosis not present

## 2015-04-22 DIAGNOSIS — I10 Essential (primary) hypertension: Secondary | ICD-10-CM | POA: Diagnosis not present

## 2015-04-22 DIAGNOSIS — S83241A Other tear of medial meniscus, current injury, right knee, initial encounter: Secondary | ICD-10-CM | POA: Diagnosis present

## 2015-04-22 DIAGNOSIS — M23003 Cystic meniscus, unspecified medial meniscus, right knee: Secondary | ICD-10-CM | POA: Insufficient documentation

## 2015-04-22 HISTORY — DX: Anxiety disorder, unspecified: F41.9

## 2015-04-22 HISTORY — PX: KNEE ARTHROSCOPY WITH MEDIAL MENISECTOMY: SHX5651

## 2015-04-22 HISTORY — PX: KNEE ARTHROSCOPY WITH LATERAL MENISECTOMY: SHX6193

## 2015-04-22 LAB — POCT I-STAT, CHEM 8
BUN: 14 mg/dL (ref 6–20)
CHLORIDE: 103 mmol/L (ref 101–111)
CREATININE: 0.9 mg/dL (ref 0.61–1.24)
Calcium, Ion: 1.22 mmol/L (ref 1.13–1.30)
Glucose, Bld: 109 mg/dL — ABNORMAL HIGH (ref 65–99)
HEMATOCRIT: 44 % (ref 39.0–52.0)
HEMOGLOBIN: 15 g/dL (ref 13.0–17.0)
Potassium: 3.3 mmol/L — ABNORMAL LOW (ref 3.5–5.1)
SODIUM: 140 mmol/L (ref 135–145)
TCO2: 25 mmol/L (ref 0–100)

## 2015-04-22 SURGERY — ARTHROSCOPY, KNEE, WITH MEDIAL MENISCECTOMY
Anesthesia: General | Site: Knee | Laterality: Right

## 2015-04-22 MED ORDER — ONDANSETRON HCL 4 MG/2ML IJ SOLN
INTRAMUSCULAR | Status: DC | PRN
  Administered 2015-04-22: 4 mg via INTRAVENOUS

## 2015-04-22 MED ORDER — BUPIVACAINE HCL 0.5 % IJ SOLN
INTRAMUSCULAR | Status: DC | PRN
  Administered 2015-04-22: 20 mL

## 2015-04-22 MED ORDER — LACTATED RINGERS IV SOLN
INTRAVENOUS | Status: DC
Start: 2015-04-22 — End: 2015-04-22
  Administered 2015-04-22: 09:00:00 via INTRAVENOUS

## 2015-04-22 MED ORDER — FENTANYL CITRATE (PF) 100 MCG/2ML IJ SOLN
50.0000 ug | INTRAMUSCULAR | Status: DC | PRN
  Administered 2015-04-22: 100 ug via INTRAVENOUS

## 2015-04-22 MED ORDER — CEFAZOLIN SODIUM-DEXTROSE 2-3 GM-% IV SOLR
2.0000 g | INTRAVENOUS | Status: AC
  Administered 2015-04-22: 2 g via INTRAVENOUS

## 2015-04-22 MED ORDER — PROMETHAZINE HCL 25 MG/ML IJ SOLN: 6.2500 mg | INTRAMUSCULAR | Status: DC | PRN

## 2015-04-22 MED ORDER — BUPIVACAINE HCL (PF) 0.5 % IJ SOLN
INTRAMUSCULAR | Status: DC | PRN
  Administered 2015-04-22: 20 mL

## 2015-04-22 MED ORDER — METHYLPREDNISOLONE ACETATE 80 MG/ML IJ SUSP
INTRAMUSCULAR | Status: DC | PRN
  Administered 2015-04-22: 160 mg

## 2015-04-22 MED ORDER — KETOROLAC TROMETHAMINE 30 MG/ML IJ SOLN: 30.0000 mg | Freq: Once | INTRAMUSCULAR | Status: AC | PRN

## 2015-04-22 MED ORDER — CHLORHEXIDINE GLUCONATE 4 % EX LIQD: 60.0000 mL | Freq: Once | CUTANEOUS | Status: DC

## 2015-04-22 MED ORDER — BUPIVACAINE HCL (PF) 0.5 % IJ SOLN
INTRAMUSCULAR | Status: AC
  Filled 2015-04-22: qty 60

## 2015-04-22 MED ORDER — LIDOCAINE HCL (CARDIAC) 20 MG/ML IV SOLN
INTRAVENOUS | Status: DC | PRN
  Administered 2015-04-22: 50 mg via INTRAVENOUS

## 2015-04-22 MED ORDER — DEXAMETHASONE SODIUM PHOSPHATE 4 MG/ML IJ SOLN
INTRAMUSCULAR | Status: DC | PRN
  Administered 2015-04-22: 10 mg via INTRAVENOUS

## 2015-04-22 MED ORDER — PROPOFOL 10 MG/ML IV BOLUS
INTRAVENOUS | Status: DC | PRN
  Administered 2015-04-22: 200 mg via INTRAVENOUS

## 2015-04-22 MED ORDER — MIDAZOLAM HCL 2 MG/2ML IJ SOLN
1.0000 mg | INTRAMUSCULAR | Status: DC | PRN
  Administered 2015-04-22: 2 mg via INTRAVENOUS

## 2015-04-22 MED ORDER — SCOPOLAMINE 1 MG/3DAYS TD PT72: 1.0000 | MEDICATED_PATCH | Freq: Once | TRANSDERMAL | Status: DC | PRN

## 2015-04-22 MED ORDER — ATROPINE SULFATE 0.4 MG/ML IJ SOLN
INTRAMUSCULAR | Status: DC | PRN
  Administered 2015-04-22: 0.4 mg via INTRAVENOUS

## 2015-04-22 MED ORDER — FENTANYL CITRATE (PF) 100 MCG/2ML IJ SOLN: 25.0000 ug | INTRAMUSCULAR | Status: DC | PRN

## 2015-04-22 MED ORDER — ONDANSETRON HCL 4 MG PO TABS: 4.0000 mg | ORAL_TABLET | Freq: Three times a day (TID) | ORAL | Status: DC | PRN

## 2015-04-22 MED ORDER — OXYCODONE-ACETAMINOPHEN 5-325 MG PO TABS: 1.0000 | ORAL_TABLET | ORAL | Status: DC | PRN

## 2015-04-22 MED ORDER — FENTANYL CITRATE (PF) 100 MCG/2ML IJ SOLN
INTRAMUSCULAR | Status: AC
  Filled 2015-04-22: qty 4

## 2015-04-22 MED ORDER — METHYLPREDNISOLONE ACETATE 80 MG/ML IJ SUSP
INTRAMUSCULAR | Status: AC
  Filled 2015-04-22: qty 2

## 2015-04-22 MED ORDER — SODIUM CHLORIDE 0.9 % IR SOLN
Status: DC | PRN
  Administered 2015-04-22: 6000 mL

## 2015-04-22 MED ORDER — BUPIVACAINE HCL (PF) 0.25 % IJ SOLN
INTRAMUSCULAR | Status: AC
  Filled 2015-04-22: qty 60

## 2015-04-22 MED ORDER — GLYCOPYRROLATE 0.2 MG/ML IJ SOLN
0.2000 mg | Freq: Once | INTRAMUSCULAR | Status: AC | PRN
  Administered 2015-04-22: 0.2 mg via INTRAVENOUS

## 2015-04-22 MED ORDER — KETOROLAC TROMETHAMINE 30 MG/ML IJ SOLN
INTRAMUSCULAR | Status: DC | PRN
  Administered 2015-04-22: 30 mg via INTRAVENOUS

## 2015-04-22 MED ORDER — MIDAZOLAM HCL 2 MG/2ML IJ SOLN
INTRAMUSCULAR | Status: AC
  Filled 2015-04-22: qty 2

## 2015-04-22 SURGICAL SUPPLY — 39 items
BANDAGE ELASTIC 6 VELCRO ST LF (GAUZE/BANDAGES/DRESSINGS) ×3 IMPLANT
BLADE CUDA 5.5 (BLADE) IMPLANT
BLADE CUDA GRT WHITE 3.5 (BLADE) IMPLANT
BLADE CUTTER GATOR 3.5 (BLADE) ×3 IMPLANT
BLADE CUTTER MENIS 5.5 (BLADE) IMPLANT
BLADE GREAT WHITE 4.2 (BLADE) ×3 IMPLANT
BUR OVAL 4.0 (BURR) IMPLANT
CUTTER MENISCUS  4.2MM (BLADE)
CUTTER MENISCUS 4.2MM (BLADE) IMPLANT
DRAPE ARTHROSCOPY W/POUCH 90 (DRAPES) ×3 IMPLANT
DURAPREP 26ML APPLICATOR (WOUND CARE) ×3 IMPLANT
ELECT MENISCUS 165MM 90D (ELECTRODE) IMPLANT
ELECT REM PT RETURN 9FT ADLT (ELECTROSURGICAL) ×3
ELECTRODE REM PT RTRN 9FT ADLT (ELECTROSURGICAL) IMPLANT
GAUZE SPONGE 4X4 12PLY STRL (GAUZE/BANDAGES/DRESSINGS) ×3 IMPLANT
GAUZE XEROFORM 1X8 LF (GAUZE/BANDAGES/DRESSINGS) ×3 IMPLANT
GLOVE BIO SURGEON STRL SZ 6.5 (GLOVE) ×1 IMPLANT
GLOVE BIOGEL PI IND STRL 7.0 (GLOVE) ×2 IMPLANT
GLOVE BIOGEL PI INDICATOR 7.0 (GLOVE) ×2
GLOVE ECLIPSE 7.0 STRL STRAW (GLOVE) ×3 IMPLANT
GLOVE ORTHO TXT STRL SZ7.5 (GLOVE) ×3 IMPLANT
GLOVE SURG ORTHO 8.0 STRL STRW (GLOVE) ×3 IMPLANT
GOWN STRL REUS W/ TWL LRG LVL3 (GOWN DISPOSABLE) ×4 IMPLANT
GOWN STRL REUS W/ TWL XL LVL3 (GOWN DISPOSABLE) ×2 IMPLANT
GOWN STRL REUS W/TWL LRG LVL3 (GOWN DISPOSABLE) ×6
GOWN STRL REUS W/TWL XL LVL3 (GOWN DISPOSABLE) ×3
HOLDER KNEE FOAM BLUE (MISCELLANEOUS) ×3 IMPLANT
IV NS IRRIG 3000ML ARTHROMATIC (IV SOLUTION) ×6 IMPLANT
KNEE WRAP E Z 3 GEL PACK (MISCELLANEOUS) ×3 IMPLANT
MANIFOLD NEPTUNE II (INSTRUMENTS) ×3 IMPLANT
PACK ARTHROSCOPY DSU (CUSTOM PROCEDURE TRAY) ×3 IMPLANT
PACK BASIN DAY SURGERY FS (CUSTOM PROCEDURE TRAY) ×3 IMPLANT
PENCIL BUTTON HOLSTER BLD 10FT (ELECTRODE) IMPLANT
SET ARTHROSCOPY TUBING (MISCELLANEOUS) ×3
SET ARTHROSCOPY TUBING LN (MISCELLANEOUS) ×2 IMPLANT
SUT ETHILON 3 0 PS 1 (SUTURE) ×3 IMPLANT
SUT VIC AB 3-0 FS2 27 (SUTURE) IMPLANT
TOWEL OR 17X24 6PK STRL BLUE (TOWEL DISPOSABLE) ×3 IMPLANT
WATER STERILE IRR 1000ML POUR (IV SOLUTION) ×3 IMPLANT

## 2015-04-22 NOTE — Interval H&P Note (Signed)
History and Physical Interval Note:  04/22/2015 7:27 AM  Jaime Orr  has presented today for surgery, with the diagnosis of RIGHT ARTHROSCOPY KNEE WITH DEBRIDEMENT/SHAVING HONDROPLASY, WITH MENSCECTOMY MEIDIAL  The various methods of treatment have been discussed with the patient and family. After consideration of risks, benefits and other options for treatment, the patient has consented to  Procedure(s): RIGHT KNEE ARTHROSCOPY WITH  CHONDROPLASTY MEDIAL MENISECTOMY (Right) as a surgical intervention .  The patient's history has been reviewed, patient examined, no change in status, stable for surgery.  I have reviewed the patient's chart and labs.  Questions were answered to the patient's satisfaction.     Vidyuth Belsito F

## 2015-04-22 NOTE — Anesthesia Preprocedure Evaluation (Signed)
Anesthesia Evaluation  Patient identified by MRN, date of birth, ID band Patient awake    Reviewed: Allergy & Precautions, NPO status , Patient's Chart, lab work & pertinent test results  Airway Mallampati: II  TM Distance: <3 FB Neck ROM: Full    Dental no notable dental hx.    Pulmonary neg pulmonary ROS, former smoker,  breath sounds clear to auscultation  Pulmonary exam normal       Cardiovascular hypertension, Pt. on medications Normal cardiovascular examRhythm:Regular Rate:Normal     Neuro/Psych negative neurological ROS  negative psych ROS   GI/Hepatic negative GI ROS, (+)     substance abuse  alcohol use,   Endo/Other  obesity  Renal/GU negative Renal ROS  negative genitourinary   Musculoskeletal negative musculoskeletal ROS (+)   Abdominal   Peds negative pediatric ROS (+)  Hematology negative hematology ROS (+)   Anesthesia Other Findings   Reproductive/Obstetrics negative OB ROS                             Anesthesia Physical Anesthesia Plan  ASA: III  Anesthesia Plan: General   Post-op Pain Management:    Induction: Intravenous  Airway Management Planned: LMA  Additional Equipment:   Intra-op Plan:   Post-operative Plan: Extubation in OR  Informed Consent: I have reviewed the patients History and Physical, chart, labs and discussed the procedure including the risks, benefits and alternatives for the proposed anesthesia with the patient or authorized representative who has indicated his/her understanding and acceptance.   Dental advisory given  Plan Discussed with: CRNA and Surgeon  Anesthesia Plan Comments:         Anesthesia Quick Evaluation

## 2015-04-22 NOTE — H&P (View-Only) (Signed)
Sota comes in for follow up.  Persistent marked mechanical symptoms medial aspect, right knee.  MRI completed showing a large displaced oblique tear to the medial meniscus as expected.  He did get some transient improvement with Cortisone injection, but it did not last very long.  He comes in to discuss definitive treatment.   History and general exam is outlined, reviewed and included in the chart.   EXAMINATION: Specifically, point tender medial aspect, right knee.  Full motion.  Stable ligaments.  Positive medial McMurray's.    DISPOSITION:  We discussed definitive treatment for his right knee.  Exam under anesthesia, arthroscopy, partial meniscectomy.  He works for the [employer].  Out of work 3-6 weeks depending on rehab and recovery.  Paperwork complete.  All questions answered.  More than 25 minutes spent face-to-face covering all of this with him, reviewing previous x-rays and going over his MRI and report.  He understands and agrees.  See him at the time of operative intervention.  Ninetta Lights, M.D.

## 2015-04-22 NOTE — Discharge Instructions (Signed)
Discharge Instructions after Knee Arthroscopy   You will have a light dressing on your knee.  Leave the dressing in place until the third day after your surgery and then remove it and place a band-aid over the stitches.  After the bandage has been removed you may shower, but do not soak the incision. You may begin gentle motion of your leg immediately after surgery. Pump your foot up and down 20 times per hour, every hour you are awake.  Apply ice to the knee 3 times per day for 30 minutes for the first 1 week until your knee is feeling comfortable again. Do not use heat.  You may begin straight leg raising exercises (if you have a brace with it on). While lying down, pull your foot all the way up, tighten your quadriceps muscle and lift your heel off of the ground. Hold this position for 2 seconds, and then let the leg back down. Repeat the exercise 10 times, at least 3 times a day.  Pain medicine has been prescribed for you.  Use your medicine as needed over the first 48 hours, and then you can begin to taper your use. You may take Extra Strength Tylenol or Tylenol only in place of the pain pills.    Please call [phone] for any problems. Including the following:  - excessive redness of the incisions - drainage for more than 4 days - fever of more than 101.5 F  *Please note that pain medications will not be refilled after hours or on weekends.   Post Anesthesia Home Care Instructions  Activity: Get plenty of rest for the remainder of the day. A responsible adult should stay with you for 24 hours following the procedure.  For the next 24 hours, DO NOT: -Drive a car -Paediatric nurse -Drink alcoholic beverages -Take any medication unless instructed by your physician -Make any legal decisions or sign important papers.  Meals: Start with liquid foods such as gelatin or soup. Progress to regular foods as tolerated. Avoid greasy, spicy, heavy foods. If nausea and/or vomiting  occur, drink only clear liquids until the nausea and/or vomiting subsides. Call your physician if vomiting continues.  Special Instructions/Symptoms: Your throat may feel dry or sore from the anesthesia or the breathing tube placed in your throat during surgery. If this causes discomfort, gargle with warm salt water. The discomfort should disappear within 24 hours.  If you had a scopolamine patch placed behind your ear for the management of post- operative nausea and/or vomiting:  1. The medication in the patch is effective for 72 hours, after which it should be removed.  Wrap patch in a tissue and discard in the trash. Wash hands thoroughly with soap and water. 2. You may remove the patch earlier than 72 hours if you experience unpleasant side effects which may include dry mouth, dizziness or visual disturbances. 3. Avoid touching the patch. Wash your hands with soap and water after contact with the patch.     Call your surgeon if you experience:   1.  Fever over 101.0. 2.  Inability to urinate. 3.  Nausea and/or vomiting. 4.  Extreme swelling or bruising at the surgical site. 5.  Continued bleeding from the incision. 6.  Increased pain, redness or drainage from the incision. 7.  Problems related to your pain medication. 8. Any change in color, movement and/or sensation 9. Any problems and/or concerns

## 2015-04-22 NOTE — Transfer of Care (Signed)
Immediate Anesthesia Transfer of Care Note  Patient: Jaime Orr  Procedure(s) Performed: Procedure(s): RIGHT KNEE ARTHROSCOPY WITH  CHONDROPLASTY MEDIAL MENISECTOMY (Right) KNEE ARTHROSCOPY WITH LATERAL MENISECTOMY (Right)  Patient Location: PACU  Anesthesia Type:General  Level of Consciousness: awake and sedated  Airway & Oxygen Therapy: Patient Spontanous Breathing and Patient connected to face mask oxygen  Post-op Assessment: Report given to RN and Post -op Vital signs reviewed and stable  Post vital signs: Reviewed and stable  Last Vitals:  Filed Vitals:   04/22/15 1005  BP:   Pulse: 76  Temp:   Resp: 11    Complications: No apparent anesthesia complications

## 2015-04-22 NOTE — Op Note (Signed)
NAME:  Jaime Orr, Jaime Orr NO.:  [id number]  MEDICAL RECORD NO.:  [id number]  LOCATION:                               FACILITY:  [facility]  PHYSICIAN:  Ninetta Lights, M.D. DATE OF BIRTH:  06-27-1951  DATE OF PROCEDURE:  04/22/2015 DATE OF DISCHARGE:  04/22/2015                              OPERATIVE REPORT   PREOPERATIVE DIAGNOSIS:  Right knee medial meniscus tear.  POSTOPERATIVE DIAGNOSES:  Right knee medial meniscus tear.  Large displaced tear, medial meniscus with a small meniscal cyst.  Frayed tearing circumferentially, lateral meniscus.  Focal grade 2 changes, central trochlea weightbearing dome, medial femoral condyle.  PROCEDURES:  Right knee exam under anesthesia, arthroscopy.  Partial medial meniscectomy, debridement of medial cyst.  Debridement of lateral meniscus.  Chondroplasty, trochlea, medial femoral condyle.  SURGEON:  Ninetta Lights, M.D.  ASSISTANT:  December, [place].  ANESTHESIA:  General.  BLOOD LOSS:  Minimal.  SPECIMENS:  None.  CULTURES:  None.  COMPLICATIONS:  None.  DRESSINGS:  Soft compressive.  TOURNIQUET:  Not employed.  PROCEDURE IN DETAIL:  The patient was brought to the operating room, placed on the operating table in a supine position.  After adequate anesthesia had been obtained, a leg holder was applied.  Leg was prepped and draped in usual sterile fashion.  Two portals, one each medial and lateral parapatellar.  Arthroscope was introduced.  Knee distended and inspected.  Good patellar tracking.  Some grade 2 changes on the trochlea, debrided.  Patella looked good.  Reactive synovitis debrided. ACL intact.  Fraying circumferentially lateral meniscus, tapered to smooth surface all the way around.  Retaining fair amount even at completion.  That compartment otherwise looked good.  Medially marked complex tearing medial meniscus with large oblique portion and all fragments folded up underneath.  This was saucerized out to a  stable rim, tapered into remaining meniscus.  A small 2-3 mm meniscal cyst at the junction of the middle and posterior third was decompressed, debrided arthroscopically.  Chondroplasty of some grade 2 changes, medial femoral condyle which was relatively focal.  All chondral loose bodies were removed.  Instruments were fully removed after I thoroughly looked throughout the knee.  Knee injected intra-articularly with Depo-Medrol and Marcaine.  Portals were closed with nylon.  Sterile compressive dressing applied.  Anesthesia reversed.  Brought to the recovery room. Tolerated the surgery well.  No complications.     Ninetta Lights, M.D.     DFM/MEDQ  D:  04/22/2015  T:  04/22/2015  Job:  [id number]

## 2015-04-22 NOTE — Anesthesia Postprocedure Evaluation (Signed)
  Anesthesia Post-op Note  Patient: Jaime Orr  Procedure(s) Performed: Procedure(s) (LRB): RIGHT KNEE ARTHROSCOPY WITH  CHONDROPLASTY MEDIAL MENISECTOMY (Right) KNEE ARTHROSCOPY WITH LATERAL MENISECTOMY (Right)  Patient Location: PACU  Anesthesia Type: General  Level of Consciousness: awake and alert   Airway and Oxygen Therapy: Patient Spontanous Breathing  Post-op Pain: mild  Post-op Assessment: Post-op Vital signs reviewed, Patient's Cardiovascular Status Stable, Respiratory Function Stable, Patent Airway and No signs of Nausea or vomiting  Last Vitals:  Filed Vitals:   04/22/15 1025  BP:   Pulse: 65  Temp:   Resp: 14    Post-op Vital Signs: stable   Complications: No apparent anesthesia complications

## 2015-04-22 NOTE — Anesthesia Procedure Notes (Signed)
Procedure Name: LMA Insertion Performed by: Terrance Mass Pre-anesthesia Checklist: Patient identified, Timeout performed, Emergency Drugs available, Suction available and Patient being monitored Patient Re-evaluated:Patient Re-evaluated prior to inductionOxygen Delivery Method: Circle system utilized Preoxygenation: Pre-oxygenation with 100% oxygen Intubation Type: IV induction Ventilation: Mask ventilation without difficulty LMA: LMA inserted LMA Size: 5.0 Number of attempts: 1 Placement Confirmation: positive ETCO2 Tube secured with: Tape Dental Injury: Teeth and Oropharynx as per pre-operative assessment

## 2015-04-23 ENCOUNTER — Encounter (HOSPITAL_BASED_OUTPATIENT_CLINIC_OR_DEPARTMENT_OTHER): Payer: Self-pay | Admitting: Orthopedic Surgery

## 2015-06-14 ENCOUNTER — Other Ambulatory Visit (INDEPENDENT_AMBULATORY_CARE_PROVIDER_SITE_OTHER): Payer: Federal, State, Local not specified - PPO

## 2015-06-14 DIAGNOSIS — I10 Essential (primary) hypertension: Secondary | ICD-10-CM | POA: Diagnosis not present

## 2015-06-14 DIAGNOSIS — E119 Type 2 diabetes mellitus without complications: Secondary | ICD-10-CM

## 2015-06-14 LAB — BASIC METABOLIC PANEL
BUN: 15 mg/dL (ref 6–23)
CHLORIDE: 101 meq/L (ref 96–112)
CO2: 28 mEq/L (ref 19–32)
Calcium: 8.8 mg/dL (ref 8.4–10.5)
Creatinine, Ser: 0.89 mg/dL (ref 0.40–1.50)
GFR: 92.07 mL/min (ref >60.00)
Glucose, Bld: 111 mg/dL — ABNORMAL HIGH (ref 70–99)
POTASSIUM: 3.3 meq/L — AB (ref 3.5–5.1)
Sodium: 138 mEq/L (ref 135–145)

## 2015-06-14 LAB — HEMOGLOBIN A1C: Hgb A1c MFr Bld: 5.6 % (ref 4.6–6.5)

## 2015-06-21 ENCOUNTER — Ambulatory Visit (INDEPENDENT_AMBULATORY_CARE_PROVIDER_SITE_OTHER): Payer: Federal, State, Local not specified - PPO | Admitting: Internal Medicine

## 2015-06-21 ENCOUNTER — Encounter: Payer: Self-pay | Admitting: Internal Medicine

## 2015-06-21 VITALS — BP 150/80 | Temp 99.1°F | Wt 227.0 lb

## 2015-06-21 DIAGNOSIS — R7301 Impaired fasting glucose: Secondary | ICD-10-CM

## 2015-06-21 DIAGNOSIS — I10 Essential (primary) hypertension: Secondary | ICD-10-CM | POA: Diagnosis not present

## 2015-06-21 DIAGNOSIS — Z23 Encounter for immunization: Secondary | ICD-10-CM | POA: Diagnosis not present

## 2015-06-21 DIAGNOSIS — E876 Hypokalemia: Secondary | ICD-10-CM

## 2015-06-21 DIAGNOSIS — Z6835 Body mass index (BMI) 35.0-35.9, adult: Secondary | ICD-10-CM

## 2015-06-21 NOTE — Patient Instructions (Signed)
bp is still up borderline.  Blood sugar   better .  Potassium .    Will referral.   For nutrition as discussed  Get rid of carbs in am.   Weight loss will help. Take 2 potassium per day  And will recheck in   3-4 month   Bmp .  Plan  Wellness visit in 4-6 months   Or as needed.

## 2015-06-21 NOTE — Progress Notes (Signed)
Pre visit review using our clinic review tool, if applicable. No additional management support is needed unless otherwise documented below in the visit note.  Chief Complaint  Patient presents with  . Follow-up    HPI: Jaime Orr 64 y.o. come inf or fu of ht hyperglycemia  Is at home recovering from surgery and wife out of town and hasn't eaten healthy with this . Says knows what to do .  Still drinking most day s.  Taking bp meds every day but only one potassium 20 per day. No change in clinical status . bp up and down.  ROS: See pertinent positives and negatives per HPI.  Past Medical History  Diagnosis Date  . OBESITY 08/09/2010  . IRON DEFICIENCY 07/18/2010    doantes blood  . DEPRESSION 12/02/2008  . HYPERTENSION 12/02/2008  . GERD 12/02/2008     no endo  . SNORING 07/18/2010  . HYPERGLYCEMIA, MILD 01/19/2010  . 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 Muscle fasciculation 11/14/2010  . Muscle twitch 11/14/2010    Left rm  Poss overuse  R/o metabolic    . Anxiety     Family History  Problem Relation Age of Onset  . Stroke Mother     59  . Depression Mother   . Hypertension Father   . Heart disease Father     30  . Alcohol abuse Father   . Sleep apnea Sister     Social History   Social History  . Marital Status: Married    Spouse Name: N/A  . Number of Children: N/A  . Years of Education: N/A   Social History Main Topics  . Smoking status: Former Research scientist (life sciences)  . Smokeless tobacco: Never Used  . Alcohol Use: 3.0 oz/week    6 Standard drinks or equivalent per week     Comment: daily 2-3 beers/day  . Drug Use: No  . Sexual Activity: Not Asked   Other Topics Concern  . 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   Originally from [place]  To [place] of 2 dogs and cats    Donates blood          Outpatient Prescriptions Prior to Visit  Medication Sig Dispense Refill  . amLODipine (NORVASC) 10 MG tablet TAKE 1 TABLET DAILY 90 tablet 2  . CIALIS 5 MG tablet TAKE 1 TABLET DAILY FOR    BENIGN PROSTATIC           HYPERTROPHY 90 tablet 1  . escitalopram (LEXAPRO) 10 MG tablet TAKE 1 TABLET DAILY 90 tablet 2  . esomeprazole (NEXIUM) 40 MG capsule TAKE 1 CAPSULE DAILY BEFOREBREAKFAST 90 capsule 2  . KLOR-CON M20 20 MEQ tablet TAKE 1 TABLET TWICE A DAY 180 tablet 2  . valsartan-hydrochlorothiazide (DIOVAN-HCT) 320-25 MG per tablet TAKE 1 TABLET DAILY 90 tablet 2  . ondansetron (ZOFRAN) 4 MG tablet Take 1 tablet (4 mg total) by mouth every 8 (eight) hours as needed for nausea or vomiting. 40 tablet 0  . oxyCODONE-acetaminophen (ROXICET) 5-325 MG per tablet Take 1-2 tablets by mouth every 4 (four) hours as needed. 60 tablet 0   Facility-Administered Medications Prior to Visit  Medication Dose Route Frequency Provider Last Rate  Last Dose  . fentaNYL (SUBLIMAZE) injection 25-50 mcg  25-50 mcg Intravenous Q5 min PRN Myrtie Soman, MD      . promethazine (PHENERGAN) injection 6.25-12.5 mg  6.25-12.5 mg Intravenous Q15 min PRN Myrtie Soman, MD         EXAM:  BP 150/80 mmHg  Temp(Src) 99.1 F (37.3 C) (Oral)  Wt 227 lb (102.967 kg)  Body mass index is 35.55 kg/(m^2). Repeat bp readings right   140 range  GENERAL: vitals reviewed and listed above, alert, oriented, appears well hydrated and in no acute distress HEENT: atraumatic, conjunctiva  clear, no obvious abnormalities on inspection of external nose and earsNECK: no obvious masses on inspection palpation  LUNGS: clear to auscultation bilaterally, no wheezes, rales or rhonchi,  CV: HRRR, no clubbing cyanosis or  peripheral edema nl cap refill  PSYCH: pleasant and cooperative, no obvious depression or anxiety Lab Results  Component Value Date   WBC 5.6 09/24/2014   HGB 15.0 04/22/2015   HCT 44.0 04/22/2015   PLT  271.0 09/24/2014   GLUCOSE 111* 06/14/2015   CHOL 223* 09/09/2014   TRIG 66.0 09/09/2014   HDL 59.30 09/09/2014   LDLCALC 151* 09/09/2014   ALT 25 09/09/2014   AST 17 09/09/2014   NA 138 06/14/2015   K 3.3* 06/14/2015   CL 101 06/14/2015   CREATININE 0.89 06/14/2015   BUN 15 06/14/2015   CO2 28 06/14/2015   TSH 1.39 09/09/2014   PSA 1.81 09/09/2014   HGBA1C 5.6 06/14/2015   MICROALBUR 1.7 07/08/2010   BP Readings from Last 3 Encounters:  06/21/15 150/80  04/22/15 152/84  12/04/14 155/80   Wt Readings from Last 3 Encounters:  06/21/15 227 lb (102.967 kg)  04/22/15 219 lb (99.338 kg)  12/04/14 220 lb (99.791 kg)     ASSESSMENT AND PLAN:  Discussed the following assessment and plan:  Essential hypertension - on going elevation on 3 meds related to weigh tgain etoh ls inertia in controlsent to nutrition - Plan: Amb ref to Medical Nutrition Therapy-MNT  Fasting hyperglycemia - better  a1c now   below 6  - Plan: Amb ref to Medical Nutrition Therapy-MNT  Need for prophylactic vaccination and inoculation against influenza - Plan: Flu Vaccine QUAD 36+ mos PF IM (Fluarix & Fluzone Quad PF)  Hypokalemia - only taking 1 pot per day  increase to 2 as directed   BMI 35.0-35.9,adult - Plan: Amb ref to Medical Nutrition Therapy-MNT Suspect life factors playing a big role in BP non control   Adding 4th med ? If help vs changing med groups .  Counseled considier other meds but willing to go to nutrition with targeted  Counseling  Choice of foods mood  Etc .  Carb cravings etc. Simple steps  Dec etoh  Total visit 34mins > 50% spent counseling and coordinating care as indicated in above note and in instructions to patient .   Patient Instructions  bp is still up borderline.  Blood sugar   better .  Potassium .    Will referral.   For nutrition as discussed  Get rid of carbs in am.   Weight loss will help. Take 2 potassium per day  And will recheck in   3-4 month   Bmp .  Plan   Wellness visit in 4-6 months   Or as needed.       Standley Brooking. Jaryn Hocutt M.D.

## 2015-06-24 ENCOUNTER — Encounter: Payer: Self-pay | Admitting: Gastroenterology

## 2015-07-13 ENCOUNTER — Other Ambulatory Visit (INDEPENDENT_AMBULATORY_CARE_PROVIDER_SITE_OTHER): Payer: Federal, State, Local not specified - PPO

## 2015-07-13 DIAGNOSIS — I10 Essential (primary) hypertension: Secondary | ICD-10-CM

## 2015-07-14 LAB — BASIC METABOLIC PANEL
BUN: 20 mg/dL (ref 6–23)
CHLORIDE: 104 meq/L (ref 96–112)
CO2: 30 meq/L (ref 19–32)
Calcium: 9 mg/dL (ref 8.4–10.5)
Creatinine, Ser: 1.03 mg/dL (ref 0.40–1.50)
GFR: 77.76 mL/min (ref >60.00)
GLUCOSE: 85 mg/dL (ref 70–99)
POTASSIUM: 3.8 meq/L (ref 3.5–5.1)
SODIUM: 141 meq/L (ref 135–145)

## 2015-07-16 ENCOUNTER — Other Ambulatory Visit: Payer: Federal, State, Local not specified - PPO

## 2015-09-06 ENCOUNTER — Other Ambulatory Visit: Payer: Self-pay | Admitting: Internal Medicine

## 2015-09-08 ENCOUNTER — Other Ambulatory Visit: Payer: Self-pay | Admitting: Family Medicine

## 2015-09-08 ENCOUNTER — Telehealth: Payer: Self-pay | Admitting: Family Medicine

## 2015-09-08 DIAGNOSIS — N400 Enlarged prostate without lower urinary tract symptoms: Secondary | ICD-10-CM

## 2015-09-08 DIAGNOSIS — R739 Hyperglycemia, unspecified: Secondary | ICD-10-CM

## 2015-09-08 DIAGNOSIS — Z0000 Encounter for general adult medical examination without abnormal findings: Secondary | ICD-10-CM

## 2015-09-08 NOTE — Telephone Encounter (Signed)
Sent to the pharmacy by e-scribe. 

## 2015-09-08 NOTE — Telephone Encounter (Signed)
Pt due for cpx and lab work.  I have placed the lab orders.  Please help him make both appointments.  Thanks!

## 2015-09-08 NOTE — Telephone Encounter (Signed)
Pt has been sch

## 2015-09-09 ENCOUNTER — Ambulatory Visit: Payer: Federal, State, Local not specified - PPO

## 2015-10-05 ENCOUNTER — Other Ambulatory Visit (INDEPENDENT_AMBULATORY_CARE_PROVIDER_SITE_OTHER): Payer: Federal, State, Local not specified - PPO

## 2015-10-05 DIAGNOSIS — R739 Hyperglycemia, unspecified: Secondary | ICD-10-CM

## 2015-10-05 DIAGNOSIS — Z0000 Encounter for general adult medical examination without abnormal findings: Secondary | ICD-10-CM

## 2015-10-05 DIAGNOSIS — N400 Enlarged prostate without lower urinary tract symptoms: Secondary | ICD-10-CM

## 2015-10-05 LAB — CBC WITH DIFFERENTIAL/PLATELET
BASOS PCT: 0.7 % (ref 0.0–3.0)
Basophils Absolute: 0 10*3/uL (ref 0.0–0.1)
EOS PCT: 4.4 % (ref 0.0–5.0)
Eosinophils Absolute: 0.2 10*3/uL (ref 0.0–0.7)
HEMATOCRIT: 38.1 % — AB (ref 39.0–52.0)
HEMOGLOBIN: 12.2 g/dL — AB (ref 13.0–17.0)
Lymphocytes Relative: 33.1 % (ref 12.0–46.0)
Lymphs Abs: 1.7 10*3/uL (ref 0.7–4.0)
MCHC: 32.1 g/dL (ref 30.0–36.0)
MCV: 78.9 fl (ref 78.0–100.0)
MONO ABS: 0.6 10*3/uL (ref 0.1–1.0)
MONOS PCT: 11.2 % (ref 3.0–12.0)
Neutro Abs: 2.6 10*3/uL (ref 1.4–7.7)
Neutrophils Relative %: 50.6 % (ref 43.0–77.0)
Platelets: 304 10*3/uL (ref 150.0–400.0)
RBC: 4.82 Mil/uL (ref 4.22–5.81)
RDW: 14.2 % (ref 11.5–15.5)
WBC: 5.1 10*3/uL (ref 4.0–10.5)

## 2015-10-05 LAB — BASIC METABOLIC PANEL
BUN: 17 mg/dL (ref 6–23)
CHLORIDE: 103 meq/L (ref 96–112)
CO2: 31 mEq/L (ref 19–32)
Calcium: 9.1 mg/dL (ref 8.4–10.5)
Creatinine, Ser: 0.84 mg/dL (ref 0.40–1.50)
GFR: 98.32 mL/min (ref >60.00)
GLUCOSE: 105 mg/dL — AB (ref 70–99)
POTASSIUM: 4.1 meq/L (ref 3.5–5.1)
SODIUM: 140 meq/L (ref 135–145)

## 2015-10-05 LAB — HEPATIC FUNCTION PANEL
ALK PHOS: 53 U/L (ref 39–117)
ALT: 21 U/L (ref 0–53)
AST: 14 U/L (ref 0–37)
Albumin: 4 g/dL (ref 3.5–5.2)
BILIRUBIN DIRECT: 0.1 mg/dL (ref 0.0–0.3)
BILIRUBIN TOTAL: 0.4 mg/dL (ref 0.2–1.2)
Total Protein: 6.6 g/dL (ref 6.0–8.3)

## 2015-10-05 LAB — LIPID PANEL
CHOL/HDL RATIO: 5
Cholesterol: 206 mg/dL — ABNORMAL HIGH (ref 0–200)
HDL: 43 mg/dL (ref >39.00)
LDL CALC: 139 mg/dL — AB (ref 0–99)
NONHDL: 162.63
Triglycerides: 116 mg/dL (ref 0.0–149.0)
VLDL: 23.2 mg/dL (ref 0.0–40.0)

## 2015-10-05 LAB — HEMOGLOBIN A1C: Hgb A1c MFr Bld: 5.8 % (ref 4.6–6.5)

## 2015-10-05 LAB — TSH: TSH: 3.4 u[IU]/mL (ref 0.35–4.50)

## 2015-10-05 LAB — PSA: PSA: 1.6 ng/mL (ref 0.10–4.00)

## 2015-10-07 ENCOUNTER — Other Ambulatory Visit: Payer: Federal, State, Local not specified - PPO

## 2015-10-13 ENCOUNTER — Encounter: Payer: Federal, State, Local not specified - PPO | Admitting: Internal Medicine

## 2015-10-13 ENCOUNTER — Encounter: Payer: Self-pay | Admitting: Family Medicine

## 2015-10-21 ENCOUNTER — Ambulatory Visit (INDEPENDENT_AMBULATORY_CARE_PROVIDER_SITE_OTHER): Payer: Federal, State, Local not specified - PPO | Admitting: Internal Medicine

## 2015-10-21 ENCOUNTER — Encounter: Payer: Self-pay | Admitting: Internal Medicine

## 2015-10-21 VITALS — BP 150/90 | Temp 99.2°F | Ht 68.0 in | Wt 222.4 lb

## 2015-10-21 DIAGNOSIS — Z79899 Other long term (current) drug therapy: Secondary | ICD-10-CM

## 2015-10-21 DIAGNOSIS — R7301 Impaired fasting glucose: Secondary | ICD-10-CM

## 2015-10-21 DIAGNOSIS — Z0000 Encounter for general adult medical examination without abnormal findings: Secondary | ICD-10-CM

## 2015-10-21 DIAGNOSIS — I10 Essential (primary) hypertension: Secondary | ICD-10-CM | POA: Diagnosis not present

## 2015-10-21 DIAGNOSIS — K219 Gastro-esophageal reflux disease without esophagitis: Secondary | ICD-10-CM

## 2015-10-21 MED ORDER — CARVEDILOL 6.25 MG PO TABS: 6.2500 mg | ORAL_TABLET | Freq: Two times a day (BID) | ORAL | Status: DC

## 2015-10-21 NOTE — Patient Instructions (Addendum)
Continue lifestyle intervention healthy eating and exercise . Modest weight loss   Can help your  Reflux and blood pressure and cholesterol . Add  Carvedilol to your meds at this time to get better blood pressure control.  Check readings at home . ROV in 2 months or as needed. Get your colonoscopy .     Health Maintenance, Male A healthy lifestyle and preventative care can promote health and wellness.  Maintain regular health, dental, and eye exams.  Eat a healthy diet. Foods like vegetables, fruits, whole grains, low-fat dairy products, and lean protein foods contain the nutrients you need and are low in calories. Decrease your intake of foods high in solid fats, added sugars, and salt. Get information about a proper diet from your health care provider, if necessary.  Regular physical exercise is one of the most important things you can do for your health. Most adults should get at least 150 minutes of moderate-intensity exercise (any activity that increases your heart rate and causes you to sweat) each week. In addition, most adults need muscle-strengthening exercises on 2 or more days a week.   Maintain a healthy weight. The body mass index (BMI) is a screening tool to identify possible weight problems. It provides an estimate of body fat based on height and weight. Your health care provider can find your BMI and can help you achieve or maintain a healthy weight. For males 20 years and older:  A BMI below 18.5 is considered underweight.  A BMI of 18.5 to 24.9 is normal.  A BMI of 25 to 29.9 is considered overweight.  A BMI of 30 and above is considered obese.  Maintain normal blood lipids and cholesterol by exercising and minimizing your intake of saturated fat. Eat a balanced diet with plenty of fruits and vegetables. Blood tests for lipids and cholesterol should begin at age 51 and be repeated every 5 years. If your lipid or cholesterol levels are high, you are over age 49, or you  are at high risk for heart disease, you may need your cholesterol levels checked more frequently.Ongoing high lipid and cholesterol levels should be treated with medicines if diet and exercise are not working.  If you smoke, find out from your health care provider how to quit. If you do not use tobacco, do not start.  Lung cancer screening is recommended for adults aged 5-80 years who are at high risk for developing lung cancer because of a history of smoking. A yearly low-dose CT scan of the lungs is recommended for people who have at least a 30-pack-year history of smoking and are current smokers or have quit within the past 15 years. A pack year of smoking is smoking an average of 1 pack of cigarettes a day for 1 year (for example, a 30-pack-year history of smoking could mean smoking 1 pack a day for 30 years or 2 packs a day for 15 years). Yearly screening should continue until the smoker has stopped smoking for at least 15 years. Yearly screening should be stopped for people who develop a health problem that would prevent them from having lung cancer treatment.  If you choose to drink alcohol, do not have more than 2 drinks per day. One drink is considered to be 12 oz (360 mL) of beer, 5 oz (150 mL) of wine, or 1.5 oz (45 mL) of liquor.  Avoid the use of street drugs. Do not share needles with anyone. Ask for help if you need support  or instructions about stopping the use of drugs.  High blood pressure causes heart disease and increases the risk of stroke. High blood pressure is more likely to develop in:  People who have blood pressure in the end of the normal range (100-139/85-89 mm Hg).  People who are overweight or obese.  People who are African American.  If you are 20-110 years of age, have your blood pressure checked every 3-5 years. If you are 82 years of age or older, have your blood pressure checked every year. You should have your blood pressure measured twice--once when you are at  a hospital or clinic, and once when you are not at a hospital or clinic. Record the average of the two measurements. To check your blood pressure when you are not at a hospital or clinic, you can use:  An automated blood pressure machine at a pharmacy.  A home blood pressure monitor.  If you are 62-40 years old, ask your health care provider if you should take aspirin to prevent heart disease.  Diabetes screening involves taking a blood sample to check your fasting blood sugar level. This should be done once every 3 years after age 54 if you are at a normal weight and without risk factors for diabetes. Testing should be considered at a younger age or be carried out more frequently if you are overweight and have at least 1 risk factor for diabetes.  Colorectal cancer can be detected and often prevented. Most routine colorectal cancer screening begins at the age of 58 and continues through age 56. However, your health care provider may recommend screening at an earlier age if you have risk factors for colon cancer. On a yearly basis, your health care provider may provide home test kits to check for hidden blood in the stool. A small camera at the end of a tube may be used to directly examine the colon (sigmoidoscopy or colonoscopy) to detect the earliest forms of colorectal cancer. Talk to your health care provider about this at age 73 when routine screening begins. A direct exam of the colon should be repeated every 5-10 years through age 25, unless early forms of precancerous polyps or small growths are found.  People who are at an increased risk for hepatitis B should be screened for this virus. You are considered at high risk for hepatitis B if:  You were born in a country where hepatitis B occurs often. Talk with your health care provider about which countries are considered high risk.  Your parents were born in a high-risk country and you have not received a shot to protect against hepatitis B  (hepatitis B vaccine).  You have HIV or AIDS.  You use needles to inject street drugs.  You live with, or have sex with, someone who has hepatitis B.  You are a man who has sex with other men (MSM).  You get hemodialysis treatment.  You take certain medicines for conditions like cancer, organ transplantation, and autoimmune conditions.  Hepatitis C blood testing is recommended for all people born from [birth year] through 1963 and any individual with known risk factors for hepatitis C.  Healthy men should no longer receive prostate-specific antigen (PSA) blood tests as part of routine cancer screening. Talk to your health care provider about prostate cancer screening.  Testicular cancer screening is not recommended for adolescents or adult males who have no symptoms. Screening includes self-exam, a health care provider exam, and other screening tests. Consult with your health  care provider about any symptoms you have or any concerns you have about testicular cancer.  Practice safe sex. Use condoms and avoid high-risk sexual practices to reduce the spread of sexually transmitted infections (STIs).  You should be screened for STIs, including gonorrhea and chlamydia if:  You are sexually active and are younger than 24 years.  You are older than 24 years, and your health care provider tells you that you are at risk for this type of infection.  Your sexual activity has changed since you were last screened, and you are at an increased risk for chlamydia or gonorrhea. Ask your health care provider if you are at risk.  If you are at risk of being infected with HIV, it is recommended that you take a prescription medicine daily to prevent HIV infection. This is called pre-exposure prophylaxis (PrEP). You are considered at risk if:  You are a man who has sex with other men (MSM).  You are a heterosexual man who is sexually active with multiple partners.  You take drugs by injection.  You are  sexually active with a partner who has HIV.  Talk with your health care provider about whether you are at high risk of being infected with HIV. If you choose to begin PrEP, you should first be tested for HIV. You should then be tested every 3 months for as long as you are taking PrEP.  Use sunscreen. Apply sunscreen liberally and repeatedly throughout the day. You should seek shade when your shadow is shorter than you. Protect yourself by wearing long sleeves, pants, a wide-brimmed hat, and sunglasses year round whenever you are outdoors.  Tell your health care provider of new moles or changes in moles, especially if there is a change in shape or color. Also, tell your health care provider if a mole is larger than the size of a pencil eraser.  A one-time screening for abdominal aortic aneurysm (AAA) and surgical repair of large AAAs by ultrasound is recommended for men aged 51-75 years who are current or former smokers.  Stay current with your vaccines (immunizations).   This information is not intended to replace advice given to you by your health care provider. Make sure you discuss any questions you have with your health care provider.   Document Released: 03/16/2008 Document Revised: 10/09/2014 Document Reviewed: 02/13/2011 Elsevier Interactive Patient Education [employer].

## 2015-10-21 NOTE — Progress Notes (Signed)
Pre visit review using our clinic review tool, if applicable. No additional management support is needed unless otherwise documented below in the visit note.  Chief Complaint  Patient presents with  . Annual Exam    medications  . Hypertension    HPI: Patient  Jaime Orr  65 y.o. comes in today for Preventive Health Care visit    BP taking med  Every day tends to be in 140 + range ocass 130 takgin meds qd  When donates blood reading is checked   GI nexium  Every day works expect.    On a long timoe  But if eats badly can get flare has been on years.  Donates blood  No bleeding but  Due for colon soon   Had left knee surgery summer [place]   Scope doing ok but less exercise .   Mood seems stable and thinks the medication helps   wife recent illness  Was on fmla doing well now  Skin cancer pre cancers   Sees derm  Health Maintenance  Topic Date Due  . Hepatitis C Screening  10-24-1950  . PNEUMOCOCCAL POLYSACCHARIDE VACCINE (1) 06/27/1955  . FOOT EXAM  06/27/1963  . OPHTHALMOLOGY EXAM  06/27/1963  . ZOSTAVAX  06/26/2013  . HEMOGLOBIN A1C  04/03/2016  . INFLUENZA VACCINE  05/02/2016  . TETANUS/TDAP  10/02/2016  . COLONOSCOPY  08/13/2020  . HIV Screening  Completed   Health Maintenance Review LIFESTYLE:  Exercise:  Work  Tobacco/ETS: no Alcohol:   About the same almose daily  Sugar beverages: 0 Sleep:  Good  8   Drug use: no  ROS:  GEN/ HEENT: No fever, significant weight changes sweats headaches vision problems hearing changes, CV/ PULM; No chest pain shortness of breath cough, syncope,edema  change in exercise tolerance. GI /GU: No adominal pain, vomiting, change in bowel habits. No blood in the stool. No significant GU symptoms. SKIN/HEME: ,no acute skin rashes suspicious lesions or bleeding. No lymphadenopathy, nodules, masses.  NEURO/ PSYCH:  No neurologic signs such as weakness numbness. No depression anxiety. IMM/ Allergy: No unusual infections.   Allergy .   REST of 12 system review negative except as per HPI   Past Medical History  Diagnosis Date  . OBESITY 08/09/2010  . IRON DEFICIENCY 07/18/2010    doantes blood  . DEPRESSION 12/02/2008  . HYPERTENSION 12/02/2008  . GERD 12/02/2008     no endo  . SNORING 07/18/2010  . HYPERGLYCEMIA, MILD 01/19/2010  . 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 Muscle fasciculation 11/14/2010  . Muscle twitch 11/14/2010    Left rm  Poss overuse  R/o metabolic    . Anxiety     Past Surgical History  Procedure Laterality Date  . Knee cartilage surgery  2009    Left Knee   . Shoulder surgery  1994    Left  . Tonsillectomy  1963  . Tonsillectomy    . Knee arthroscopy with medial menisectomy Right 04/22/2015    Procedure: RIGHT KNEE ARTHROSCOPY WITH  CHONDROPLASTY MEDIAL MENISECTOMY;  Surgeon: Kathryne Hitch, MD;  Location: [place];  Service: Orthopedics;  Laterality: Right;  . Knee arthroscopy with lateral menisectomy Right 04/22/2015    Procedure: KNEE ARTHROSCOPY WITH LATERAL MENISECTOMY;  Surgeon: Kathryne Hitch, MD;  Location: [place];  Service: Orthopedics;  Laterality: Right;    Family History  Problem Relation Age of Onset  . Stroke Mother  29  . Depression Mother   . Hypertension Father   . Heart disease Father     93  . Alcohol abuse Father   . Sleep apnea Sister     Social History   Social History  . Marital Status: Married    Spouse Name: N/A  . Number of Children: N/A  . Years of Education: N/A   Social History Main Topics  . Smoking status: Former Research scientist (life sciences)  . Smokeless tobacco: Never Used  . Alcohol Use: 3.0 oz/week    6 Standard drinks or equivalent per week     Comment: daily 2-3 beers/day  . Drug Use: No  . Sexual Activity: Not Asked   Other Topics Concern  . 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   Originally from [place]  To [place] of 2 dogs and cats    Donates blood          Outpatient Prescriptions Prior to Visit  Medication Sig Dispense Refill  . amLODipine (NORVASC) 10 MG tablet TAKE 1 TABLET DAILY 90 tablet 0  . CIALIS 5 MG tablet TAKE 1 TABLET DAILY FOR    BENIGN PROSTATIC           HYPERTROPHY 90 tablet 0  . escitalopram (LEXAPRO) 10 MG tablet TAKE 1 TABLET DAILY 90 tablet 0  . esomeprazole (NEXIUM) 40 MG capsule TAKE 1 CAPSULE DAILY BEFOREBREAKFAST 90 capsule 0  . KLOR-CON M20 20 MEQ tablet TAKE 1 TABLET TWICE A DAY (Patient taking differently: Take 2 Tablets Once Daily) 180 tablet 0  . valsartan-hydrochlorothiazide (DIOVAN-HCT) 320-25 MG tablet TAKE 1 TABLET DAILY 90 tablet 0   Facility-Administered Medications Prior to Visit  Medication Dose Route Frequency Provider Last Rate Last Dose  . fentaNYL (SUBLIMAZE) injection 25-50 mcg  25-50 mcg Intravenous Q5 min PRN Myrtie Soman, MD      . promethazine (PHENERGAN) injection 6.25-12.5 mg  6.25-12.5 mg Intravenous Q15 min PRN Myrtie Soman, MD         EXAM:  BP 150/90 mmHg  Temp(Src) 99.2 F (37.3 C) (Oral)  Ht 5\' 8"  (1.727 m)  Wt 222 lb 6.4 oz (100.88 kg)  BMI 33.82 kg/m2  Body mass index is 33.82 kg/(m^2).  Physical Exam: Vital signs reviewed [id number] is a well-developed well-nourished alert cooperative    who appearsr stated age in no acute distress.  HEENT: normocephalic atraumatic , Eyes: PERRL EOM's full, conjunctiva clear, Nares: paten,t no deformity discharge or tenderness., Ears: no deformity EAC's clear TMs with normal landmarks. Mouth: clear OP, no lesions, edema.  Moist mucous membranes. Dentition in adequate repair. NECK: supple without masses, thyromegaly or bruits. CHEST/PULM:  Clear to auscultation and percussion breath sounds equal no wheeze , rales or rhonchi. No chest wall deformities or tenderness. CV: PMI is nondisplaced, S1 S2 no  gallops, murmurs, rubs. Peripheral pulses are full without delay.No JVD .  ABDOMEN: Bowel sounds normal nontender  No guard or rebound, no hepato splenomegal no CVA tenderness. . Extremtities:  No clubbing cyanosis or edema, no acute joint swelling or redness no focal atrophy NEURO:  Oriented x3, cranial nerves 3-12 appear to be intact, no obvious focal weakness,gait within normal limits no abnormal reflexes or asymmetrical SKIN: No acute rashes normal turgor, color, no bruising  or petechiae.  Sun changes   Scaly areas.  PSYCH: Oriented, good eye contact, no obvious depression anxiety, cognition and judgment appear normal. LN: no cervical axillary inguinal adenopathy  Lab Results  Component Value Date   WBC 5.1 10/05/2015   HGB 12.2* 10/05/2015   HCT 38.1* 10/05/2015   PLT 304.0 10/05/2015   GLUCOSE 105* 10/05/2015   CHOL 206* 10/05/2015   TRIG 116.0 10/05/2015   HDL 43.00 10/05/2015   LDLCALC 139* 10/05/2015   ALT 21 10/05/2015   AST 14 10/05/2015   NA 140 10/05/2015   K 4.1 10/05/2015   CL 103 10/05/2015   CREATININE 0.84 10/05/2015   BUN 17 10/05/2015   CO2 31 10/05/2015   TSH 3.40 10/05/2015   PSA 1.60 10/05/2015   HGBA1C 5.8 10/05/2015   MICROALBUR 1.7 07/08/2010   BP Readings from Last 3 Encounters:  10/21/15 150/90  06/21/15 150/80  04/22/15 152/84   Wt Readings from Last 3 Encounters:  10/21/15 222 lb 6.4 oz (100.88 kg)  06/21/15 227 lb (102.967 kg)  04/22/15 219 lb (99.338 kg)    ASSESSMENT AND PLAN:  Discussed the following assessment and plan:  Visit for preventive health examination - utd get colon when due  Essential hypertension -  still up lsi and add carvedilol 6.25 bid and fu   Fasting hyperglycemia - a1c ok wasnt fasting last labs   Medication management - cont lexa[pro and ppi for now consider wean at some point  Gastroesophageal reflux disease, esophagitis presence not specified Anemia felt to be from  Blood donation donates about every 3  months  Patient Care Team: Burnis Medin, MD as PCP - General Ninetta Lights, MD (Orthopedic Surgery) Inda Castle, MD (Gastroenterology) [facility] dermat[facility] Patient Instructions  Continue lifestyle intervention healthy eating and exercise . Modest weight loss   Can help your  Reflux and blood pressure and cholesterol . Add  Carvedilol to your meds at this time to get better blood pressure control.  Check readings at home . ROV in 2 months or as needed. Get your colonoscopy .     Health Maintenance, Male A healthy lifestyle and preventative care can promote health and wellness.  Maintain regular health, dental, and eye exams.  Eat a healthy diet. Foods like vegetables, fruits, whole grains, low-fat dairy products, and lean protein foods contain the nutrients you need and are low in calories. Decrease your intake of foods high in solid fats, added sugars, and salt. Get information about a proper diet from your health care provider, if necessary.  Regular physical exercise is one of the most important things you can do for your health. Most adults should get at least 150 minutes of moderate-intensity exercise (any activity that increases your heart rate and causes you to sweat) each week. In addition, most adults need muscle-strengthening exercises on 2 or more days a week.   Maintain a healthy weight. The body mass index (BMI) is a screening tool to identify possible weight problems. It provides an estimate of body fat based on height and weight. Your health care provider can find your BMI and can help you achieve or maintain a healthy weight. For males 20 years and older:  A BMI below 18.5 is considered underweight.  A BMI of 18.5 to 24.9 is normal.  A BMI of 25 to 29.9 is considered overweight.  A BMI of 30 and above is considered obese.  Maintain normal blood lipids and cholesterol by exercising and  minimizing your intake of saturated fat. Eat a balanced diet with plenty  of fruits and vegetables. Blood tests for lipids and cholesterol should begin at age 50 and be repeated every 5 years. If your lipid or cholesterol levels are high, you are over age 16, or you are at high risk for heart disease, you may need your cholesterol levels checked more frequently.Ongoing high lipid and cholesterol levels should be treated with medicines if diet and exercise are not working.  If you smoke, find out from your health care provider how to quit. If you do not use tobacco, do not start.  Lung cancer screening is recommended for adults aged 28-80 years who are at high risk for developing lung cancer because of a history of smoking. A yearly low-dose CT scan of the lungs is recommended for people who have at least a 30-pack-year history of smoking and are current smokers or have quit within the past 15 years. A pack year of smoking is smoking an average of 1 pack of cigarettes a day for 1 year (for example, a 30-pack-year history of smoking could mean smoking 1 pack a day for 30 years or 2 packs a day for 15 years). Yearly screening should continue until the smoker has stopped smoking for at least 15 years. Yearly screening should be stopped for people who develop a health problem that would prevent them from having lung cancer treatment.  If you choose to drink alcohol, do not have more than 2 drinks per day. One drink is considered to be 12 oz (360 mL) of beer, 5 oz (150 mL) of wine, or 1.5 oz (45 mL) of liquor.  Avoid the use of street drugs. Do not share needles with anyone. Ask for help if you need support or instructions about stopping the use of drugs.  High blood pressure causes heart disease and increases the risk of stroke. High blood pressure is more likely to develop in:  People who have blood pressure in the end of the normal range (100-139/85-89 mm Hg).  People who are overweight or obese.  People who are African American.  If you are 75-41 years of age, have your  blood pressure checked every 3-5 years. If you are 49 years of age or older, have your blood pressure checked every year. You should have your blood pressure measured twice--once when you are at a hospital or clinic, and once when you are not at a hospital or clinic. Record the average of the two measurements. To check your blood pressure when you are not at a hospital or clinic, you can use:  An automated blood pressure machine at a pharmacy.  A home blood pressure monitor.  If you are 37-63 years old, ask your health care provider if you should take aspirin to prevent heart disease.  Diabetes screening involves taking a blood sample to check your fasting blood sugar level. This should be done once every 3 years after age 24 if you are at a normal weight and without risk factors for diabetes. Testing should be considered at a younger age or be carried out more frequently if you are overweight and have at least 1 risk factor for diabetes.  Colorectal cancer can be detected and often prevented. Most routine colorectal cancer screening begins at the age of 29 and continues through age 16. However, your health care provider may recommend screening at an earlier age if you have risk factors for colon cancer. On a yearly basis, your  health care provider may provide home test kits to check for hidden blood in the stool. A small camera at the end of a tube may be used to directly examine the colon (sigmoidoscopy or colonoscopy) to detect the earliest forms of colorectal cancer. Talk to your health care provider about this at age 79 when routine screening begins. A direct exam of the colon should be repeated every 5-10 years through age 70, unless early forms of precancerous polyps or small growths are found.  People who are at an increased risk for hepatitis B should be screened for this virus. You are considered at high risk for hepatitis B if:  You were born in a country where hepatitis B occurs often. Talk  with your health care provider about which countries are considered high risk.  Your parents were born in a high-risk country and you have not received a shot to protect against hepatitis B (hepatitis B vaccine).  You have HIV or AIDS.  You use needles to inject street drugs.  You live with, or have sex with, someone who has hepatitis B.  You are a man who has sex with other men (MSM).  You get hemodialysis treatment.  You take certain medicines for conditions like cancer, organ transplantation, and autoimmune conditions.  Hepatitis C blood testing is recommended for all people born from [birth year] through 1963 and any individual with known risk factors for hepatitis C.  Healthy men should no longer receive prostate-specific antigen (PSA) blood tests as part of routine cancer screening. Talk to your health care provider about prostate cancer screening.  Testicular cancer screening is not recommended for adolescents or adult males who have no symptoms. Screening includes self-exam, a health care provider exam, and other screening tests. Consult with your health care provider about any symptoms you have or any concerns you have about testicular cancer.  Practice safe sex. Use condoms and avoid high-risk sexual practices to reduce the spread of sexually transmitted infections (STIs).  You should be screened for STIs, including gonorrhea and chlamydia if:  You are sexually active and are younger than 24 years.  You are older than 24 years, and your health care provider tells you that you are at risk for this type of infection.  Your sexual activity has changed since you were last screened, and you are at an increased risk for chlamydia or gonorrhea. Ask your health care provider if you are at risk.  If you are at risk of being infected with HIV, it is recommended that you take a prescription medicine daily to prevent HIV infection. This is called pre-exposure prophylaxis (PrEP). You are  considered at risk if:  You are a man who has sex with other men (MSM).  You are a heterosexual man who is sexually active with multiple partners.  You take drugs by injection.  You are sexually active with a partner who has HIV.  Talk with your health care provider about whether you are at high risk of being infected with HIV. If you choose to begin PrEP, you should first be tested for HIV. You should then be tested every 3 months for as long as you are taking PrEP.  Use sunscreen. Apply sunscreen liberally and repeatedly throughout the day. You should seek shade when your shadow is shorter than you. Protect yourself by wearing long sleeves, pants, a wide-brimmed hat, and sunglasses year round whenever you are outdoors.  Tell your health care provider of new moles or changes in moles,  especially if there is a change in shape or color. Also, tell your health care provider if a mole is larger than the size of a pencil eraser.  A one-time screening for abdominal aortic aneurysm (AAA) and surgical repair of large AAAs by ultrasound is recommended for men aged 37-75 years who are current or former smokers.  Stay current with your vaccines (immunizations).   This information is not intended to replace advice given to you by your health care provider. Make sure you discuss any questions you have with your health care provider.   Document Released: 03/16/2008 Document Revised: 10/09/2014 Document Reviewed: 02/13/2011 Elsevier Interactive Patient Education 2016 [employer] K. Panosh M.D.

## 2015-12-06 ENCOUNTER — Other Ambulatory Visit: Payer: Self-pay | Admitting: Family Medicine

## 2015-12-06 MED ORDER — ESOMEPRAZOLE MAGNESIUM 40 MG PO CPDR: DELAYED_RELEASE_CAPSULE | ORAL | Status: DC

## 2015-12-06 MED ORDER — CARVEDILOL 6.25 MG PO TABS: 6.2500 mg | ORAL_TABLET | Freq: Two times a day (BID) | ORAL | Status: DC

## 2015-12-06 MED ORDER — TADALAFIL 5 MG PO TABS: ORAL_TABLET | ORAL | Status: DC

## 2015-12-06 MED ORDER — POTASSIUM CHLORIDE CRYS ER 20 MEQ PO TBCR: 20.0000 meq | EXTENDED_RELEASE_TABLET | Freq: Two times a day (BID) | ORAL | Status: DC

## 2015-12-06 MED ORDER — AMLODIPINE BESYLATE 10 MG PO TABS: 10.0000 mg | ORAL_TABLET | Freq: Every day | ORAL | Status: DC

## 2015-12-06 MED ORDER — VALSARTAN-HYDROCHLOROTHIAZIDE 320-25 MG PO TABS: 1.0000 | ORAL_TABLET | Freq: Every day | ORAL | Status: DC

## 2015-12-06 MED ORDER — ESCITALOPRAM OXALATE 10 MG PO TABS: 10.0000 mg | ORAL_TABLET | Freq: Every day | ORAL | Status: DC

## 2015-12-06 NOTE — Telephone Encounter (Signed)
Sent to the pharmacy by e-scribe. 

## 2015-12-21 ENCOUNTER — Ambulatory Visit (INDEPENDENT_AMBULATORY_CARE_PROVIDER_SITE_OTHER): Payer: Federal, State, Local not specified - PPO | Admitting: Internal Medicine

## 2015-12-21 ENCOUNTER — Encounter: Payer: Self-pay | Admitting: Internal Medicine

## 2015-12-21 VITALS — BP 184/94 | Temp 98.8°F | Wt 221.0 lb

## 2015-12-21 DIAGNOSIS — J069 Acute upper respiratory infection, unspecified: Secondary | ICD-10-CM

## 2015-12-21 DIAGNOSIS — I10 Essential (primary) hypertension: Secondary | ICD-10-CM | POA: Diagnosis not present

## 2015-12-21 DIAGNOSIS — F430 Acute stress reaction: Secondary | ICD-10-CM

## 2015-12-21 DIAGNOSIS — B9789 Other viral agents as the cause of diseases classified elsewhere: Secondary | ICD-10-CM

## 2015-12-21 NOTE — Patient Instructions (Signed)
Plan no work force the next 2-3 weeks  and then reevaluate.  Take the BP medication  as directed in the interim and  avoid alcohol in the interim .     Advise  acute counseling for now     Stress and Stress Management Stress is a normal reaction to life events. It is what you feel when life demands more than you are used to or more than you can handle. Some stress can be useful. For example, the stress reaction can help you catch the last bus of the day, study for a test, or meet a deadline at work. But stress that occurs too often or for too long can cause problems. It can affect your emotional health and interfere with relationships and normal daily activities. Too much stress can weaken your immune system and increase your risk for physical illness. If you already have a medical problem, stress can make it worse. CAUSES  All sorts of life events may cause stress. An event that causes stress for one person may not be stressful for another person. Major life events commonly cause stress. These may be positive or negative. Examples include losing your job, moving into a new home, getting married, having a baby, or losing a loved one. Less obvious life events may also cause stress, especially if they occur day after day or in combination. Examples include working long hours, driving in traffic, caring for children, being in debt, or being in a difficult relationship. SIGNS AND SYMPTOMS Stress may cause emotional symptoms including, the following:  Anxiety. This is feeling worried, afraid, on edge, overwhelmed, or out of control.  Anger. This is feeling irritated or impatient.  Depression. This is feeling sad, down, helpless, or guilty.  Difficulty focusing, remembering, or making decisions. Stress may cause physical symptoms, including the following:   Aches and pains. These may affect your head, neck, back, stomach, or other areas of your body.  Tight muscles or clenched jaw.  Low energy  or trouble sleeping. Stress may cause unhealthy behaviors, including the following:   Eating to feel better (overeating) or skipping meals.  Sleeping too little, too much, or both.  Working too much or putting off tasks (procrastination).  Smoking, drinking alcohol, or using drugs to feel better. DIAGNOSIS  Stress is diagnosed through an assessment by your health care provider. Your health care provider will ask questions about your symptoms and any stressful life events.Your health care provider will also ask about your medical history and may order blood tests or other tests. Certain medical conditions and medicine can cause physical symptoms similar to stress. Mental illness can cause emotional symptoms and unhealthy behaviors similar to stress. Your health care provider may refer you to a mental health professional for further evaluation.  TREATMENT  Stress management is the recommended treatment for stress.The goals of stress management are reducing stressful life events and coping with stress in healthy ways.  Techniques for reducing stressful life events include the following:  Stress identification. Self-monitor for stress and identify what causes stress for you. These skills may help you to avoid some stressful events.  Time management. Set your priorities, keep a calendar of events, and learn to say "no." These tools can help you avoid making too many commitments. Techniques for coping with stress include the following:  Rethinking the problem. Try to think realistically about stressful events rather than ignoring them or overreacting. Try to find the positives in a stressful situation rather than focusing on  the negatives.  Exercise. Physical exercise can release both physical and emotional tension. The key is to find a form of exercise you enjoy and do it regularly.  Relaxation techniques. These relax the body and mind. Examples include yoga, meditation, tai chi, biofeedback,  deep breathing, progressive muscle relaxation, listening to music, being out in nature, journaling, and other hobbies. Again, the key is to find one or more that you enjoy and can do regularly.  Healthy lifestyle. Eat a balanced diet, get plenty of sleep, and do not smoke. Avoid using alcohol or drugs to relax.  Strong support network. Spend time with family, friends, or other people you enjoy being around.Express your feelings and talk things over with someone you trust. Counseling or talktherapy with a mental health professional may be helpful if you are having difficulty managing stress on your own. Medicine is typically not recommended for the treatment of stress.Talk to your health care provider if you think you need medicine for symptoms of stress. HOME CARE INSTRUCTIONS  Keep all follow-up visits as directed by your health care provider.  Take all medicines as directed by your health care provider. SEEK MEDICAL CARE IF:  Your symptoms get worse or you start having new symptoms.  You feel overwhelmed by your problems and can no longer manage them on your own. SEEK IMMEDIATE MEDICAL CARE IF:  You feel like hurting yourself or someone else.   This information is not intended to replace advice given to you by your health care provider. Make sure you discuss any questions you have with your health care provider.   Document Released: 03/14/2001 Document Revised: 10/09/2014 Document Reviewed: 05/13/2013 Elsevier Interactive Patient Education [employer].

## 2015-12-21 NOTE — Progress Notes (Signed)
Pre visit review using our clinic review tool, if applicable. No additional management support is needed unless otherwise documented below in the visit note.  Chief Complaint  Patient presents with  . Anxiety    HPI: Jaime Orr 65 y.o.  Acute visit  For job stress .   Scenario  yesterday a person showed up and t old to have him train for jov and hsi jov would be replaced had to call supervisor who hadnt told him about this  In job working for 38 years .  Very upset pacing hard to sleep and bp is up.  No cp but has had cold flu and now cough getting better but wife wanted to check .   bp was   Decent before    This happened .   140/90 but only taking the coreg in am  Not the pm dose  Less etoh recently  Not suircidal or such    ROS: See pertinent positives and negatives per HPI.  Past Medical History  Diagnosis Date  . OBESITY 08/09/2010  . IRON DEFICIENCY 07/18/2010    doantes blood  . DEPRESSION 12/02/2008  . HYPERTENSION 12/02/2008  . GERD 12/02/2008     no endo  . SNORING 07/18/2010  . HYPERGLYCEMIA, MILD 01/19/2010  . 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 Muscle fasciculation 11/14/2010  . Muscle twitch 11/14/2010    Left rm  Poss overuse  R/o metabolic    . Anxiety     Family History  Problem Relation Age of Onset  . Stroke Mother     35  . Depression Mother   . Hypertension Father   . Heart disease Father     94  . Alcohol abuse Father   . Sleep apnea Sister     Social History   Social History  . Marital Status: Married    Spouse Name: N/A  . Number of Children: N/A  . Years of Education: N/A   Social History Main Topics  . Smoking status: Former Research scientist (life sciences)  . Smokeless tobacco: Never Used  . Alcohol Use: 3.0 oz/week    6 Standard drinks or equivalent per week     Comment: daily 2-3 beers/day  . Drug Use: No  . Sexual Activity: Not Asked   Other Topics Concern  . 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   Originally from [place]  To [place] of 2 dogs and cats    Donates blood          Outpatient Prescriptions Prior to Visit  Medication Sig Dispense Refill  . amLODipine (NORVASC) 10 MG tablet Take 1 tablet (10 mg total) by mouth daily. 90 tablet 2  . carvedilol (COREG) 6.25 MG tablet Take 1 tablet (6.25 mg total) by mouth 2 (two) times daily with a meal. For hypertension 180 tablet 2  . escitalopram (LEXAPRO) 10 MG tablet Take 1 tablet (10 mg total) by mouth daily. 90 tablet 2  . esomeprazole (NEXIUM) 40 MG capsule TAKE 1 CAPSULE DAILY BEFOREBREAKFAST 90 capsule 2  . potassium chloride SA (KLOR-CON M20) 20 MEQ tablet Take 1 tablet (20 mEq total) by mouth 2 (two) times daily. 180 tablet 2  . tadalafil (CIALIS)  5 MG tablet TAKE 1 TABLET DAILY FOR    BENIGN PROSTATIC           HYPERTROPHY 90 tablet 2  . valsartan-hydrochlorothiazide (DIOVAN-HCT) 320-25 MG tablet Take 1 tablet by mouth daily. 90 tablet 2   Facility-Administered Medications Prior to Visit  Medication Dose Route Frequency Provider Last Rate Last Dose  . fentaNYL (SUBLIMAZE) injection 25-50 mcg  25-50 mcg Intravenous Q5 min PRN Myrtie Soman, MD      . promethazine (PHENERGAN) injection 6.25-12.5 mg  6.25-12.5 mg Intravenous Q15 min PRN Myrtie Soman, MD         EXAM:  BP 184/94 mmHg  Temp(Src) 98.8 F (37.1 C) (Oral)  Wt 221 lb (100.245 kg)  Body mass index is 33.61 kg/(m^2).  GENERAL: vitals reviewed and listed above, alert, oriented, appears well hydrated  I;looking stressed and upset   ocass dry cough HEENT: atraumatic, conjunctiva  clear, no obvious abnormalities on inspection of external nose and ears OP : no lesion edema or exudate  NECK: no obvious masses on inspection palpation  LUNGS: clear to auscultation bilaterally, no wheezes, rales or rhonchi, good air  movement CV: HRRR, no clubbing cyanosis or  peripheral edema nl cap refill  MS: moves all extremities without noticeable focal  abnormality PSYCH: cooperative,  Nl speech   Stress  And affects  Lab Results  Component Value Date   WBC 5.1 10/05/2015   HGB 12.2* 10/05/2015   HCT 38.1* 10/05/2015   PLT 304.0 10/05/2015   GLUCOSE 105* 10/05/2015   CHOL 206* 10/05/2015   TRIG 116.0 10/05/2015   HDL 43.00 10/05/2015   LDLCALC 139* 10/05/2015   ALT 21 10/05/2015   AST 14 10/05/2015   NA 140 10/05/2015   K 4.1 10/05/2015   CL 103 10/05/2015   CREATININE 0.84 10/05/2015   BUN 17 10/05/2015   CO2 31 10/05/2015   TSH 3.40 10/05/2015   PSA 1.60 10/05/2015   HGBA1C 5.8 10/05/2015   MICROALBUR 1.7 07/08/2010   BP Readings from Last 3 Encounters:  12/21/15 184/94  10/21/15 150/90  06/21/15 150/80    ASSESSMENT AND PLAN:  Discussed the following assessment and plan:  Essential hypertension - up today  prob from stress  take bid coreg out of work for 2-3 weeks and then reassess   Acute stress reaction - see text  prob effecting bp readings  and health risk   Viral upper respiratory tract infection with cough - convalescing  follow Pt will look into counsleing  Wiith eap or Dr Laurence Slate al team information given  Disc other options of meds  Not to change at this time Note for work  And reassess .  To avoid   Health adverse reactions  And pto aware  Some exercise ok   Aerobic type.  -Patient advised to return or notify health care team  if symptoms worsen ,persist or new concerns arise. In the interim  Total visit 56mns > 50% spent counseling and coordinating care as indicated in above note and in instructions to patient .     Patient Instructions  Plan no work force the next 2-3 weeks  and then reevaluate.  Take the BP medication  as directed in the interim and  avoid alcohol in the interim .     Advise  acute counseling for now     Stress and Stress Management Stress is  a normal reaction to life events. It is what you feel when life  demands more than you are used to or more than you can handle. Some stress can be useful. For example, the stress reaction can help you catch the last bus of the day, study for a test, or meet a deadline at work. But stress that occurs too often or for too long can cause problems. It can affect your emotional health and interfere with relationships and normal daily activities. Too much stress can weaken your immune system and increase your risk for physical illness. If you already have a medical problem, stress can make it worse. CAUSES  All sorts of life events may cause stress. An event that causes stress for one person may not be stressful for another person. Major life events commonly cause stress. These may be positive or negative. Examples include losing your job, moving into a new home, getting married, having a baby, or losing a loved one. Less obvious life events may also cause stress, especially if they occur day after day or in combination. Examples include working long hours, driving in traffic, caring for children, being in debt, or being in a difficult relationship. SIGNS AND SYMPTOMS Stress may cause emotional symptoms including, the following:  Anxiety. This is feeling worried, afraid, on edge, overwhelmed, or out of control.  Anger. This is feeling irritated or impatient.  Depression. This is feeling sad, down, helpless, or guilty.  Difficulty focusing, remembering, or making decisions. Stress may cause physical symptoms, including the following:   Aches and pains. These may affect your head, neck, back, stomach, or other areas of your body.  Tight muscles or clenched jaw.  Low energy or trouble sleeping. Stress may cause unhealthy behaviors, including the following:   Eating to feel better (overeating) or skipping meals.  Sleeping too little, too much, or both.  Working too much or putting off tasks  (procrastination).  Smoking, drinking alcohol, or using drugs to feel better. DIAGNOSIS  Stress is diagnosed through an assessment by your health care provider. Your health care provider will ask questions about your symptoms and any stressful life events.Your health care provider will also ask about your medical history and may order blood tests or other tests. Certain medical conditions and medicine can cause physical symptoms similar to stress. Mental illness can cause emotional symptoms and unhealthy behaviors similar to stress. Your health care provider may refer you to a mental health professional for further evaluation.  TREATMENT  Stress management is the recommended treatment for stress.The goals of stress management are reducing stressful life events and coping with stress in healthy ways.  Techniques for reducing stressful life events include the following:  Stress identification. Self-monitor for stress and identify what causes stress for you. These skills may help you to avoid some stressful events.  Time management. Set your priorities, keep a calendar of events, and learn to say "no." These tools can help you avoid making too many commitments. Techniques for coping with stress include the following:  Rethinking the problem. Try to think realistically about stressful events rather than ignoring them or overreacting. Try to find the positives in a stressful situation rather than focusing on the negatives.  Exercise. Physical exercise can release both physical and emotional tension. The key is to find a form of exercise you enjoy and do it regularly.  Relaxation techniques. These relax the body and mind. Examples include yoga, meditation, tai chi, biofeedback, deep breathing, progressive muscle relaxation, listening to music, being out in nature, journaling, and other hobbies. Again, the key is  to find one or more that you enjoy and can do regularly.  Healthy lifestyle. Eat a  balanced diet, get plenty of sleep, and do not smoke. Avoid using alcohol or drugs to relax.  Strong support network. Spend time with family, friends, or other people you enjoy being around.Express your feelings and talk things over with someone you trust. Counseling or talktherapy with a mental health professional may be helpful if you are having difficulty managing stress on your own. Medicine is typically not recommended for the treatment of stress.Talk to your health care provider if you think you need medicine for symptoms of stress. HOME CARE INSTRUCTIONS  Keep all follow-up visits as directed by your health care provider.  Take all medicines as directed by your health care provider. SEEK MEDICAL CARE IF:  Your symptoms get worse or you start having new symptoms.  You feel overwhelmed by your problems and can no longer manage them on your own. SEEK IMMEDIATE MEDICAL CARE IF:  You feel like hurting yourself or someone else.   This information is not intended to replace advice given to you by your health care provider. Make sure you discuss any questions you have with your health care provider.   Document Released: 03/14/2001 Document Revised: 10/09/2014 Document Reviewed: 05/13/2013 Elsevier Interactive Patient Education 2016 Elsevier Inc.            Wanda K. Panosh M.D.   

## 2016-01-03 ENCOUNTER — Ambulatory Visit (INDEPENDENT_AMBULATORY_CARE_PROVIDER_SITE_OTHER): Payer: Federal, State, Local not specified - PPO | Admitting: Internal Medicine

## 2016-01-03 ENCOUNTER — Encounter: Payer: Self-pay | Admitting: Internal Medicine

## 2016-01-03 VITALS — BP 158/80 | Temp 98.3°F | Wt 227.4 lb

## 2016-01-03 DIAGNOSIS — M25512 Pain in left shoulder: Secondary | ICD-10-CM | POA: Diagnosis not present

## 2016-01-03 DIAGNOSIS — F430 Acute stress reaction: Secondary | ICD-10-CM | POA: Diagnosis not present

## 2016-01-03 DIAGNOSIS — Z79899 Other long term (current) drug therapy: Secondary | ICD-10-CM | POA: Diagnosis not present

## 2016-01-03 DIAGNOSIS — I10 Essential (primary) hypertension: Secondary | ICD-10-CM

## 2016-01-03 MED ORDER — CARVEDILOL 12.5 MG PO TABS: 12.5000 mg | ORAL_TABLET | Freq: Two times a day (BID) | ORAL | Status: DC

## 2016-01-03 NOTE — Progress Notes (Signed)
Pre visit review using our clinic review tool, if applicable. No additional management support is needed unless otherwise documented below in the visit note.  Chief Complaint  Patient presents with  . Follow-up    stress  . Hypertension    HPI: Jaime Orr 65 y.o.  Comes in for fu  bp stress etc  Getting retirement ball "in the works".   Has some support s   m  BP  Didn't check at house .    Taking coreg  Bid  And  Not helping  So far .   2 beers  Pre dinner 3 -4   Seeing work counselor  A few times said he had it together mostly  .    Hurt left upper chest  Pushing wheelbarropw   Tender  L;eft [place] joint area and  Hurts to raise arm at shoulder  ROS: See pertinent positives and negatives per HPI. No other cp sob  Falling  Go sx  No fever some weight gain  Some loose stools   Past Medical History  Diagnosis Date  . OBESITY 08/09/2010  . IRON DEFICIENCY 07/18/2010    doantes blood  . DEPRESSION 12/02/2008  . HYPERTENSION 12/02/2008  . GERD 12/02/2008     no endo  . SNORING 07/18/2010  . HYPERGLYCEMIA, MILD 01/19/2010  . 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 Muscle fasciculation 11/14/2010  . Muscle twitch 11/14/2010    Left rm  Poss overuse  R/o metabolic    . Anxiety     Family History  Problem Relation Age of Onset  . Stroke Mother     54  . Depression Mother   . Hypertension Father   . Heart disease Father     25  . Alcohol abuse Father   . Sleep apnea Sister     Social History   Social History  . Marital Status: Married    Spouse Name: N/A  . Number of Children: N/A  . Years of Education: N/A   Social History Main Topics  . Smoking status: Former Research scientist (life sciences)  . Smokeless tobacco: Never Used  . Alcohol Use: 3.0 oz/week    6 Standard drinks or equivalent per week     Comment: daily 2-3 beers/day  . Drug Use: No  . Sexual Activity: Not Asked   Other Topics Concern  . 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   Originally from [place]  To [place] of 2 dogs and cats    Donates blood          Outpatient Prescriptions Prior to Visit  Medication Sig Dispense Refill  . amLODipine (NORVASC) 10 MG tablet Take 1 tablet (10 mg total) by mouth daily. 90 tablet 2  . carvedilol (COREG) 6.25 MG tablet Take 1 tablet (6.25 mg total) by mouth 2 (two) times daily with a meal. For hypertension 180 tablet 2  . escitalopram (LEXAPRO) 10 MG tablet Take 1 tablet (10 mg total) by mouth daily. 90 tablet 2  . esomeprazole (NEXIUM) 40 MG capsule TAKE 1 CAPSULE DAILY BEFOREBREAKFAST 90 capsule 2  . potassium chloride SA (KLOR-CON M20) 20 MEQ tablet Take  1 tablet (20 mEq total) by mouth 2 (two) times daily. 180 tablet 2  . tadalafil (CIALIS) 5 MG tablet TAKE 1 TABLET DAILY FOR    BENIGN PROSTATIC           HYPERTROPHY 90 tablet 2  . valsartan-hydrochlorothiazide (DIOVAN-HCT) 320-25 MG tablet Take 1 tablet by mouth daily. 90 tablet 2   Facility-Administered Medications Prior to Visit  Medication Dose Route Frequency Provider Last Rate Last Dose  . fentaNYL (SUBLIMAZE) injection 25-50 mcg  25-50 mcg Intravenous Q5 min PRN Myrtie Soman, MD      . promethazine (PHENERGAN) injection 6.25-12.5 mg  6.25-12.5 mg Intravenous Q15 min PRN Myrtie Soman, MD         EXAM:  BP 158/80 mmHg  Temp(Src) 98.3 F (36.8 C) (Oral)  Wt 227 lb 6.4 oz (103.148 kg)  Body mass index is 34.58 kg/(m^2). Repeat bp 158/80 GENERAL: vitals reviewed and listed above, alert, oriented, appears well hydrated and in no acute distress more relaxed  HEENT: atraumatic, conjunctiva  clear, no obvious abnormalities on inspection of external nose and ears  NECK: no obvious masses on inspection palpation  LUNGS: clear to auscultation bilaterally, no wheezes, rales or rhonchi,    CV: HRRR, no clubbing cyanosis or  peripheral edema nl cap refill   Chest wall tender at scjt area  rom shoulder 180 degrees MS: moves all extremities without noticeable focal  abnormality PSYCH: pleasant and cooperative, no obvious depression or anxiety    BP Readings from Last 3 Encounters:  01/03/16 158/80  12/21/15 184/94  10/21/15 150/90   Wt Readings from Last 3 Encounters:  01/03/16 227 lb 6.4 oz (103.148 kg)  12/21/15 221 lb (100.245 kg)  10/21/15 222 lb 6.4 oz (100.88 kg)   156/80 Lab Results  Component Value Date   WBC 5.1 10/05/2015   HGB 12.2* 10/05/2015   HCT 38.1* 10/05/2015   PLT 304.0 10/05/2015   GLUCOSE 105* 10/05/2015   CHOL 206* 10/05/2015   TRIG 116.0 10/05/2015   HDL 43.00 10/05/2015   LDLCALC 139* 10/05/2015   ALT 21 10/05/2015   AST 14 10/05/2015   NA 140 10/05/2015   K 4.1 10/05/2015   CL 103 10/05/2015   CREATININE 0.84 10/05/2015   BUN 17 10/05/2015   CO2 31 10/05/2015   TSH 3.40 10/05/2015   PSA 1.60 10/05/2015   HGBA1C 5.8 10/05/2015   MICROALBUR 1.7 07/08/2010    ASSESSMENT AND PLAN:  Discussed the following assessment and plan:  Essential hypertension - not better inc to 12.5 bid coreg consider other inc  limit etoh  Acute stress reaction - improved still cont some counseling stateguies disc  Medication management  Joint pain in the shoulder/clavicle region, left - prob strain   follow expectant managment   dsic above inc carved to 12.5 bid  With opportunity to go 25 bid if needed   Stress  Doing better   rov in 4 weeks  bp and  How doing -Patient advised to return or notify health care team  if symptoms worsen ,persist or new concerns arise. Letter for work Total visit 45mins > 50% spent counseling and coordinating care as indicated in above note and in instructions to patient .    Patient Instructions  Increase to  12.5 mg  Twice a day    We may consider going up to 25 mg twice a day.   Stay  out of work and ROV in 1  months.  To reassess.  Continue   Counselor as needed .  Limit alcohol to  2 per day   To avoid  BP effect .    Standley Brooking. Panosh M.D.

## 2016-01-03 NOTE — Patient Instructions (Signed)
Increase to  12.5 mg  Twice a day    We may consider going up to 25 mg twice a day.   Stay  out of work and ROV in 1 months.  To reassess.  Continue   Counselor as needed .  Limit alcohol to  2 per day   To avoid  BP effect .

## 2016-01-07 ENCOUNTER — Ambulatory Visit: Payer: Federal, State, Local not specified - PPO | Admitting: Internal Medicine

## 2016-01-30 NOTE — Progress Notes (Signed)
Pre visit review using our clinic review tool, if applicable. No additional management support is needed unless otherwise documented below in the visit note.  Chief Complaint  Patient presents with  . Follow-up    bp    HPI: Jaime Orr 65 y.o.  Fu sever labile ht  Stress at work  Continues to stay out [place] coreg to 25 bid and no se   etoh in pm not as much exercise somewhat down but ok. realized has gained weight .    ROS: See pertinent positives and negatives per HPI. No cp sob edema  Past Medical History  Diagnosis Date  . OBESITY 08/09/2010  . IRON DEFICIENCY 07/18/2010    doantes blood  . DEPRESSION 12/02/2008  . HYPERTENSION 12/02/2008  . GERD 12/02/2008     no endo  . SNORING 07/18/2010  . HYPERGLYCEMIA, MILD 01/19/2010  . 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 Muscle fasciculation 11/14/2010  . Muscle twitch 11/14/2010    Left rm  Poss overuse  R/o metabolic    . Anxiety     Family History  Problem Relation Age of Onset  . Stroke Mother     60  . Depression Mother   . Hypertension Father   . Heart disease Father     39  . Alcohol abuse Father   . Sleep apnea Sister     Social History   Social History  . Marital Status: Married    Spouse Name: N/A  . Number of Children: N/A  . Years of Education: N/A   Social History Main Topics  . Smoking status: Former Research scientist (life sciences)  . Smokeless tobacco: Never Used  . Alcohol Use: 3.0 oz/week    6 Standard drinks or equivalent per week     Comment: daily 2-3 beers/day  . Drug Use: No  . Sexual Activity: Not Asked   Other Topics Concern  . 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   Originally from [place]  To [place] of 2 dogs and cats    Donates blood          Outpatient  Prescriptions Prior to Visit  Medication Sig Dispense Refill  . amLODipine (NORVASC) 10 MG tablet Take 1 tablet (10 mg total) by mouth daily. 90 tablet 2  . escitalopram (LEXAPRO) 10 MG tablet Take 1 tablet (10 mg total) by mouth daily. 90 tablet 2  . esomeprazole (NEXIUM) 40 MG capsule TAKE 1 CAPSULE DAILY BEFOREBREAKFAST 90 capsule 2  . potassium chloride SA (KLOR-CON M20) 20 MEQ tablet Take 1 tablet (20 mEq total) by mouth 2 (two) times daily. 180 tablet 2  . tadalafil (CIALIS) 5 MG tablet TAKE 1 TABLET DAILY FOR    BENIGN PROSTATIC           HYPERTROPHY 90 tablet 2  . valsartan-hydrochlorothiazide (DIOVAN-HCT) 320-25 MG tablet Take 1 tablet by mouth daily. 90 tablet 2  . carvedilol (COREG) 12.5 MG tablet Take 1 tablet (12.5 mg total) by mouth 2 (two) times daily with a meal. Can increase to 2 po bid if needed for BP control. 60 tablet 3  . carvedilol (COREG) 6.25  MG tablet Take 1 tablet (6.25 mg total) by mouth 2 (two) times daily with a meal. For hypertension 180 tablet 2   Facility-Administered Medications Prior to Visit  Medication Dose Route Frequency Provider Last Rate Last Dose  . fentaNYL (SUBLIMAZE) injection 25-50 mcg  25-50 mcg Intravenous Q5 min PRN Myrtie Soman, MD      . promethazine (PHENERGAN) injection 6.25-12.5 mg  6.25-12.5 mg Intravenous Q15 min PRN Myrtie Soman, MD         EXAM:  BP 150/80 mmHg  Temp(Src) 98.6 F (37 C) (Oral)  Wt 228 lb 11.2 oz (103.738 kg)  Body mass index is 34.78 kg/(m^2).  GENERAL: vitals reviewed and listed above, alert, oriented, appears well hydrated and in no acute distress HEENT: atraumatic, conjunctiva  clear, no obvious abnormalities on inspection of external nose and earsNECK: no obvious masses on inspection palpation  CV: HRRR, no clubbing cyanosis or  peripheral edema nl cap refill  MS: moves all extremities without noticeable focal  abnormality PSYCH: pleasant and cooperative,   Normal speech  Lab Results  Component Value Date    WBC 5.1 10/05/2015   HGB 12.2* 10/05/2015   HCT 38.1* 10/05/2015   PLT 304.0 10/05/2015   GLUCOSE 105* 10/05/2015   CHOL 206* 10/05/2015   TRIG 116.0 10/05/2015   HDL 43.00 10/05/2015   LDLCALC 139* 10/05/2015   ALT 21 10/05/2015   AST 14 10/05/2015   NA 140 10/05/2015   K 4.1 10/05/2015   CL 103 10/05/2015   CREATININE 0.84 10/05/2015   BUN 17 10/05/2015   CO2 31 10/05/2015   TSH 3.40 10/05/2015   PSA 1.60 10/05/2015   HGBA1C 5.8 10/05/2015   MICROALBUR 1.7 07/08/2010   Wt Readings from Last 3 Encounters:  01/31/16 228 lb 11.2 oz (103.738 kg)  01/03/16 227 lb 6.4 oz (103.148 kg)  12/21/15 221 lb (100.245 kg)   BP Readings from Last 3 Encounters:  01/31/16 150/80  01/03/16 158/80  12/21/15 184/94    ASSESSMENT AND PLAN:  Discussed the following assessment and plan:  Resistant hypertension ?  Medication management  Stress  Heavy alcohol use  Weight gain Total visit 30mins > 50% spent counseling and coordinating care as indicated in above note and in instructions to patient .     considier add other med ? spironolactone and drop the k dosing  But I agree    lsi would help a lot and he wants to go that route  He is on  4 meds and still not controlled   -Patient advised to return or notify health care team  if symptoms worsen ,persist or new concerns arise. Note for work  June 19 fu  Earlier if needed  Patient Instructions    carveilol   25 mg twice a day.    Is max dose and stay on this  Increase activity . ..... Dec etoh .  Continue counselor.   Weight gain can be counterproductive  Wt Readings from Last 3 Encounters:  01/31/16 228 lb 11.2 oz (103.738 kg)  01/03/16 227 lb 6.4 oz (103.148 kg)  12/21/15 221 lb (100.245 kg)  goal dec weight   With healthy habit s.    Lab Results  Component Value Date   WBC 5.1 10/05/2015   HGB 12.2* 10/05/2015   HCT 38.1* 10/05/2015   PLT 304.0 10/05/2015   GLUCOSE 105* 10/05/2015   CHOL 206* 10/05/2015   TRIG  116.0 10/05/2015   HDL 43.00 10/05/2015  LDLCALC 139* 10/05/2015   ALT 21 10/05/2015   AST 14 10/05/2015   NA 140 10/05/2015   K 4.1 10/05/2015   CL 103 10/05/2015   CREATININE 0.84 10/05/2015   BUN 17 10/05/2015   CO2 31 10/05/2015   TSH 3.40 10/05/2015   PSA 1.60 10/05/2015   HGBA1C 5.8 10/05/2015   MICROALBUR 1.7 07/08/2010        Wanda K. Panosh M.D.

## 2016-01-31 ENCOUNTER — Encounter: Payer: Self-pay | Admitting: Family Medicine

## 2016-01-31 ENCOUNTER — Ambulatory Visit (INDEPENDENT_AMBULATORY_CARE_PROVIDER_SITE_OTHER): Payer: Federal, State, Local not specified - PPO | Admitting: Internal Medicine

## 2016-01-31 ENCOUNTER — Encounter: Payer: Self-pay | Admitting: Internal Medicine

## 2016-01-31 VITALS — BP 150/80 | Temp 98.6°F | Wt 228.7 lb

## 2016-01-31 DIAGNOSIS — I10 Essential (primary) hypertension: Secondary | ICD-10-CM

## 2016-01-31 DIAGNOSIS — Z789 Other specified health status: Secondary | ICD-10-CM | POA: Diagnosis not present

## 2016-01-31 DIAGNOSIS — R635 Abnormal weight gain: Secondary | ICD-10-CM

## 2016-01-31 DIAGNOSIS — Z658 Other specified problems related to psychosocial circumstances: Secondary | ICD-10-CM | POA: Diagnosis not present

## 2016-01-31 DIAGNOSIS — F1090 Alcohol use, unspecified, uncomplicated: Secondary | ICD-10-CM

## 2016-01-31 DIAGNOSIS — F439 Reaction to severe stress, unspecified: Secondary | ICD-10-CM

## 2016-01-31 DIAGNOSIS — Z79899 Other long term (current) drug therapy: Secondary | ICD-10-CM | POA: Diagnosis not present

## 2016-01-31 MED ORDER — CARVEDILOL 25 MG PO TABS: 25.0000 mg | ORAL_TABLET | Freq: Two times a day (BID) | ORAL | Status: DC

## 2016-01-31 NOTE — Patient Instructions (Addendum)
carveilol   25 mg twice a day.    Is max dose and stay on this  Increase activity . ..... Dec etoh .  Continue counselor.   Weight gain can be counterproductive  Wt Readings from Last 3 Encounters:  01/31/16 228 lb 11.2 oz (103.738 kg)  01/03/16 227 lb 6.4 oz (103.148 kg)  12/21/15 221 lb (100.245 kg)  goal dec weight   With healthy habit s.    Lab Results  Component Value Date   WBC 5.1 10/05/2015   HGB 12.2* 10/05/2015   HCT 38.1* 10/05/2015   PLT 304.0 10/05/2015   GLUCOSE 105* 10/05/2015   CHOL 206* 10/05/2015   TRIG 116.0 10/05/2015   HDL 43.00 10/05/2015   LDLCALC 139* 10/05/2015   ALT 21 10/05/2015   AST 14 10/05/2015   NA 140 10/05/2015   K 4.1 10/05/2015   CL 103 10/05/2015   CREATININE 0.84 10/05/2015   BUN 17 10/05/2015   CO2 31 10/05/2015   TSH 3.40 10/05/2015   PSA 1.60 10/05/2015   HGBA1C 5.8 10/05/2015   MICROALBUR 1.7 07/08/2010

## 2016-03-07 ENCOUNTER — Encounter: Payer: Self-pay | Admitting: Internal Medicine

## 2016-03-07 MED ORDER — TADALAFIL 5 MG PO TABS: ORAL_TABLET | ORAL | Status: DC

## 2016-03-16 NOTE — Progress Notes (Signed)
Submitted PA through Cover[employer]Meds for: tadalafil (CIALIS) 5 MG tablet.

## 2016-03-17 NOTE — Progress Notes (Signed)
Pre visit review using our clinic review tool, if applicable. No additional management support is needed unless otherwise documented below in the visit note.  Chief Complaint  Patient presents with  . Follow-up    HPI: Jaime Orr 65 y.o.  Fu  issues   Down some  friend lleft .   Feels depressive  An issue but counselor at work thinks he is doing as expected  No panic attacks.   BP  ? Taking 3 meds   Every day   Exercise  Some walking.   Not monitoring .  Doesn't ereally want to mess with psych meds .  Sleep  Lots of such  Sometimes not motivated to get out of bed   about 2-3 per day  etoh    ROS: See pertinent positives and negatives per HPI. No cp sob panic attacks    Past Medical History  Diagnosis Date  . OBESITY 08/09/2010  . IRON DEFICIENCY 07/18/2010    doantes blood  . DEPRESSION 12/02/2008  . HYPERTENSION 12/02/2008  . GERD 12/02/2008     no endo  . SNORING 07/18/2010  . HYPERGLYCEMIA, MILD 01/19/2010  . 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 Muscle fasciculation 11/14/2010  . Muscle twitch 11/14/2010    Left rm  Poss overuse  R/o metabolic    . Anxiety     Family History  Problem Relation Age of Onset  . Stroke Mother     95  . Depression Mother   . Hypertension Father   . Heart disease Father     17  . Alcohol abuse Father   . Sleep apnea Sister     Social History   Social History  . Marital Status: Married    Spouse Name: N/A  . Number of Children: N/A  . Years of Education: N/A   Social History Main Topics  . Smoking status: Former Research scientist (life sciences)  . Smokeless tobacco: Never Used  . Alcohol Use: 3.0 oz/week    6 Standard drinks or equivalent per week     Comment: daily 2-3 beers/day  . Drug Use: No  . Sexual Activity: Not on file   Other Topics Concern  . 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   Originally from [place]  To [place] of 2 dogs and cats    Donates blood          Outpatient Prescriptions Prior to Visit  Medication Sig Dispense Refill  . amLODipine (NORVASC) 10 MG tablet Take 1 tablet (10 mg total) by mouth daily. 90 tablet 2  . carvedilol (COREG) 25 MG tablet Take 1 tablet (25 mg total) by mouth 2 (two) times daily with a meal. 180 tablet 3  . escitalopram (LEXAPRO) 10 MG tablet Take 1 tablet (10 mg total) by mouth daily. 90 tablet 2  . esomeprazole (NEXIUM) 40 MG capsule TAKE 1 CAPSULE DAILY BEFOREBREAKFAST 90 capsule 2  . potassium chloride SA (KLOR-CON M20) 20 MEQ tablet Take 1 tablet (20 mEq total) by mouth 2 (two) times daily. 180 tablet 2  . tadalafil (CIALIS) 5 MG tablet TAKE 1 TABLET DAILY FOR  BENIGN PROSTATIC           HYPERTROPHY 90 tablet 2  . valsartan-hydrochlorothiazide (DIOVAN-HCT) 320-25 MG tablet Take 1 tablet by mouth daily. 90 tablet 2   Facility-Administered Medications Prior to Visit  Medication Dose Route Frequency Provider Last Rate Last Dose  . fentaNYL (SUBLIMAZE) injection 25-50 mcg  25-50 mcg Intravenous Q5 min PRN Myrtie Soman, MD      . promethazine (PHENERGAN) injection 6.25-12.5 mg  6.25-12.5 mg Intravenous Q15 min PRN Myrtie Soman, MD         EXAM:  BP 144/70 mmHg  Temp(Src) 99.8 F (37.7 C) (Oral)  Wt 230 lb 14.4 oz (104.736 kg)  Body mass index is 35.12 kg/(m^2).  GENERAL: vitals reviewed and listed above, alert, oriented, appears well hydrated and in no acute distress HEENT: atraumatic, conjunctiva  clear, no obvious abnormalities on inspection of external nose and ears MS: moves all extremities without noticeable focal  abnormality PSYCH: pleasant and cooperative, no obvious depression or anxiety Skin check right leg 3 mm brown tan scalingrough  Lesion poss SK   PHQ15  9 tired  Sleep. GAD 6   PHQ9  15 somewhat  Sleep energy interst   Failure feeling  Neg suicidal  BP Readings from Last 3 Encounters:  03/20/16 144/70  01/31/16 150/80  01/03/16 158/80   Wt Readings from Last 3 Encounters:  03/20/16 230 lb 14.4 oz (104.736 kg)  01/31/16 228 lb 11.2 oz (103.738 kg)  01/03/16 227 lb 6.4 oz (103.148 kg)   Lab Results  Component Value Date   WBC 5.1 10/05/2015   HGB 12.2* 10/05/2015   HCT 38.1* 10/05/2015   PLT 304.0 10/05/2015   GLUCOSE 105* 10/05/2015   CHOL 206* 10/05/2015   TRIG 116.0 10/05/2015   HDL 43.00 10/05/2015   LDLCALC 139* 10/05/2015   ALT 21 10/05/2015   AST 14 10/05/2015   NA 140 10/05/2015   K 4.1 10/05/2015   CL 103 10/05/2015   CREATININE 0.84 10/05/2015   BUN 17 10/05/2015   CO2 31 10/05/2015   TSH 3.40 10/05/2015   PSA 1.60 10/05/2015   HGBA1C 5.8 10/05/2015   MICROALBUR 1.7 07/08/2010     ASSESSMENT AND PLAN:  Discussed the following assessment and plan:  Resistant hypertension ? - Patient wants to wait for hypertension clinic would rather treat the depression CP can get more exercise lifestyle and readdress  Adjustment disorder with mixed anxiety and depressed mood - life changes   phq15 done disc add wellbutrin augmentation and fu   Medication management  Stress Add exercise   (See below)  As med -Patient advised to return or notify health care team  if symptoms worsen ,persist or new concerns arise. Total visit 25mins > 50% spent counseling and coordinating care as indicated in above note and in instructions to patient .  Not to continue out of work for 2 months Patient Instructions  bp better but still not at goal  Plan possible referral to   Hypertension clinic. If not better at next  Visit .  Add low dose wellbutrin to help with depressive sx.   stauy on all the same meds otherwise .  ROV in about 6 weeks to see how doing. Increase walking  Get a pedometer  See walking as medicine.  At least 30 minutes a day .   Goal 10000 steps . Work your way up.    Standley Brooking.  Panosh M.D.

## 2016-03-20 ENCOUNTER — Encounter: Payer: Self-pay | Admitting: Internal Medicine

## 2016-03-20 ENCOUNTER — Telehealth: Payer: Self-pay | Admitting: *Deleted

## 2016-03-20 ENCOUNTER — Ambulatory Visit (INDEPENDENT_AMBULATORY_CARE_PROVIDER_SITE_OTHER): Payer: Federal, State, Local not specified - PPO | Admitting: Internal Medicine

## 2016-03-20 VITALS — BP 144/70 | Temp 99.8°F | Wt 230.9 lb

## 2016-03-20 DIAGNOSIS — Z658 Other specified problems related to psychosocial circumstances: Secondary | ICD-10-CM | POA: Diagnosis not present

## 2016-03-20 DIAGNOSIS — I10 Essential (primary) hypertension: Secondary | ICD-10-CM

## 2016-03-20 DIAGNOSIS — I1A0 Resistant hypertension: Secondary | ICD-10-CM

## 2016-03-20 DIAGNOSIS — F4323 Adjustment disorder with mixed anxiety and depressed mood: Secondary | ICD-10-CM | POA: Diagnosis not present

## 2016-03-20 DIAGNOSIS — Z79899 Other long term (current) drug therapy: Secondary | ICD-10-CM | POA: Diagnosis not present

## 2016-03-20 DIAGNOSIS — F439 Reaction to severe stress, unspecified: Secondary | ICD-10-CM

## 2016-03-20 MED ORDER — BUPROPION HCL ER (XL) 150 MG PO TB24: 150.0000 mg | ORAL_TABLET | Freq: Every day | ORAL | Status: DC

## 2016-03-20 NOTE — Patient Instructions (Addendum)
bp better but still not at goal  Plan possible referral to   Hypertension clinic. If not better at next  Visit .  Add low dose wellbutrin to help with depressive sx.   stauy on all the same meds otherwise .  ROV in about 6 weeks to see how doing. Increase walking  Get a pedometer  See walking as medicine.  At least 30 minutes a day .   Goal 10000 steps . Work your way up.

## 2016-03-20 NOTE — Telephone Encounter (Signed)
Prior authorization for Cialis 5 mg approved until 03/16/17

## 2016-03-28 ENCOUNTER — Encounter: Payer: Self-pay | Admitting: Gastroenterology

## 2016-04-26 ENCOUNTER — Ambulatory Visit (AMBULATORY_SURGERY_CENTER): Payer: Self-pay | Admitting: *Deleted

## 2016-04-26 VITALS — Ht 67.0 in | Wt 226.0 lb

## 2016-04-26 DIAGNOSIS — Z86010 Personal history of colonic polyps: Secondary | ICD-10-CM

## 2016-04-26 MED ORDER — NA SULFATE-K SULFATE-MG SULF 17.5-3.13-1.6 GM/177ML PO SOLN
1.0000 | Freq: Once | ORAL | 0 refills | Status: AC
Start: 2016-04-26 — End: 2016-04-26

## 2016-05-04 ENCOUNTER — Encounter: Payer: Self-pay | Admitting: Gastroenterology

## 2016-05-04 NOTE — Progress Notes (Signed)
Pre visit review using our clinic review tool, if applicable. No additional management support is needed unless otherwise documented below in the visit note.  Chief Complaint  Patient presents with  . Follow-up    HPI: Jaime Orr 65 y.o.  Here with wife today   Not taking  Carvedilol  Bid   Daily  But bid  Had "excellent readings"  137/81 ...since vacation .     Wasn't  convenient  To take  . Weeksonly carvedilol qd for weeks    Well butrin   Caused anxiety and no sleep.   Not sleeping. So stopped after 2 weeks  Still stressed and down   Not worse   Still having   Anxiety    Job loss related .   etoh off and on   Now has urinary urgency  And urge incontinence  Not always related to  Intake or etoh  Some snoring worse  Poss obs when etoh  Not otherwise  coulnt donate lood last  Time  Hg too low but doesn regularly otherwise ROS: See pertinent positives and negatives per HPI.  Past Medical History:  Diagnosis Date  . Anxiety   . Cancer ([place])    skin cancer  . DEPRESSION 12/02/2008  . GERD 12/02/2008    no endo  . Heart murmur    born with  . 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 HYPERGLYCEMIA, MILD 01/19/2010  . HYPERTENSION 12/02/2008  . Hypertension   . IRON DEFICIENCY 07/18/2010   doantes blood  . Muscle fasciculation 11/14/2010  . Muscle twitch 11/14/2010   Left rm  Poss overuse  R/o metabolic    . OBESITY 08/09/2010  . SNORING 07/18/2010    Family History  Problem Relation Age of Onset  . Stroke Mother     79  . Depression Mother   . Hypertension Father   . Heart disease Father     80  . Alcohol abuse Father   . Colon polyps Father   . Sleep apnea Sister   . Colon cancer Neg Hx   . Esophageal cancer Neg Hx   . Rectal cancer Neg Hx   . Stomach cancer Neg Hx     Social History   Social History  . Marital status: Married    Spouse name: N/A  . Number of children: N/A  . Years of education: N/A   Social History Main Topics  .  Smoking status: Former Research scientist (life sciences)  . Smokeless tobacco: Never Used  . Alcohol use 8.4 oz/week    14 Cans of beer per week     Comment: daily 2-3 beers/day  . Drug use: No  . Sexual activity: Not Asked   Other Topics Concern  . 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   Originally from [place]  To [place] of 2 dogs and cats    Donates blood          Outpatient Medications Prior to Visit  Medication Sig Dispense Refill  . amLODipine (NORVASC) 10 MG tablet Take 1 tablet (10 mg total) by mouth daily. 90 tablet 2  . carvedilol (COREG) 25 MG tablet Take 1  tablet (25 mg total) by mouth 2 (two) times daily with a meal. 180 tablet 3  . esomeprazole (NEXIUM) 40 MG capsule TAKE 1 CAPSULE DAILY BEFOREBREAKFAST 90 capsule 2  . potassium chloride SA (KLOR-CON M20) 20 MEQ tablet Take 1 tablet (20 mEq total) by mouth 2 (two) times daily. 180 tablet 2  . tadalafil (CIALIS) 5 MG tablet TAKE 1 TABLET DAILY FOR    BENIGN PROSTATIC           HYPERTROPHY 90 tablet 2  . valsartan-hydrochlorothiazide (DIOVAN-HCT) 320-25 MG tablet Take 1 tablet by mouth daily. 90 tablet 2  . escitalopram (LEXAPRO) 10 MG tablet Take 1 tablet (10 mg total) by mouth daily. 90 tablet 2  . buPROPion (WELLBUTRIN XL) 150 MG 24 hr tablet Take 1 tablet (150 mg total) by mouth daily. (Patient not taking: Reported on 04/26/2016) 30 tablet 3   Facility-Administered Medications Prior to Visit  Medication Dose Route Frequency Provider Last Rate Last Dose  . fentaNYL (SUBLIMAZE) injection 25-50 mcg  25-50 mcg Intravenous Q5 min PRN Myrtie Soman, MD      . promethazine (PHENERGAN) injection 6.25-12.5 mg  6.25-12.5 mg Intravenous Q15 min PRN Myrtie Soman, MD         EXAM:  BP (!) 168/80 (BP Location: Right Arm, Patient Position:  Sitting, Cuff Size: Large)   Temp 98.2 F (36.8 C) (Oral)   Wt 227 lb 9.6 oz (103.2 kg)   BMI 35.65 kg/m   Body mass index is 35.65 kg/m.  GENERAL: vitals reviewed and listed above, alert, oriented, appears well hydrated and in no acute distress HEENT: atraumatic, conjunctiva  clear, no obvious abnormalities on inspection of external nose and ears  NECK: no obvious masses on inspection palpation  LUNGS: clear to auscultation bilaterally, no wheezes, rales or rhonchi, CV: HRRR, no clubbing cyanosis or  peripheral edema nl cap refill  MS: moves all extremities without noticeable focal  abnormality PSYCH: pleasant and cooperative, BP Readings from Last 3 Encounters:  05/05/16 (!) 168/80  03/20/16 (!) 144/70  01/31/16 (!) 150/80   Wt Readings from Last 3 Encounters:  05/05/16 227 lb 9.6 oz (103.2 kg)  04/26/16 226 lb (102.5 kg)  03/20/16 230 lb 14.4 oz (104.7 kg)   Lab Results  Component Value Date   WBC 5.1 10/05/2015   HGB 12.2 (L) 10/05/2015   HCT 38.1 (L) 10/05/2015   PLT 304.0 10/05/2015   GLUCOSE 105 (H) 10/05/2015   CHOL 206 (H) 10/05/2015   TRIG 116.0 10/05/2015   HDL 43.00 10/05/2015   LDLCALC 139 (H) 10/05/2015   ALT 21 10/05/2015   AST 14 10/05/2015   NA 140 10/05/2015   K 4.1 10/05/2015   CL 103 10/05/2015   CREATININE 0.84 10/05/2015   BUN 17 10/05/2015   CO2 31 10/05/2015   TSH 3.40 10/05/2015   PSA 1.60 10/05/2015   HGBA1C 5.8 10/05/2015   MICROALBUR 1.7 07/08/2010    ASSESSMENT AND PLAN:  Discussed the following assessment and plan:  Resistant hypertension ? prob non adherance to med - disc strategies  cannot help if he cannot takemed  will get pill box take meds  even if eating late etcwife agrees w plan  Adjustment disorder with mixed anxiety and depressed mood - optinos  se with wellbutrin inc lexapro if not helpful consider snri  Medication management  Heavy alcohol use  Urinary urgency - ocurrs  even when not using etoh.    with some UI   get ruology  appt - Plan: Ambulatory referral to Urology  Urinary frequency - Plan: Ambulatory referral to Urology  Current non-adherence to medical treatment - counseled Total visit 26mins > 50% spent counseling and coordinating care as indicated in above note and in instructions to patient .  Note for work again . -Patient advised to return or notify health care team  if symptoms worsen ,persist or new concerns arise. Anemia flet from blood donation Patient Instructions  Your blood pressure is too hight today but you haven t taken med today and   Last pm   It is very important to  take the medication as prescribed .   To be able to see if  Working or not.   Alcohol decreases good sleep .  Will be contacted about urology consult.   increas the lexapro to 20 mg per day  .  consideration of change to effexor , cymbalta pristique type meds  May  Get better effect .  ROV in  6 weeks  Again .       Standley Brooking. Aigner Horseman M.D.

## 2016-05-05 ENCOUNTER — Encounter: Payer: Self-pay | Admitting: Internal Medicine

## 2016-05-05 ENCOUNTER — Encounter: Payer: Self-pay | Admitting: Family Medicine

## 2016-05-05 ENCOUNTER — Ambulatory Visit (INDEPENDENT_AMBULATORY_CARE_PROVIDER_SITE_OTHER): Payer: Federal, State, Local not specified - PPO | Admitting: Internal Medicine

## 2016-05-05 VITALS — BP 168/80 | Temp 98.2°F | Wt 227.6 lb

## 2016-05-05 DIAGNOSIS — R350 Frequency of micturition: Secondary | ICD-10-CM

## 2016-05-05 DIAGNOSIS — I10 Essential (primary) hypertension: Secondary | ICD-10-CM

## 2016-05-05 DIAGNOSIS — F1090 Alcohol use, unspecified, uncomplicated: Secondary | ICD-10-CM

## 2016-05-05 DIAGNOSIS — Z9119 Patient's noncompliance with other medical treatment and regimen: Secondary | ICD-10-CM

## 2016-05-05 DIAGNOSIS — F4323 Adjustment disorder with mixed anxiety and depressed mood: Secondary | ICD-10-CM

## 2016-05-05 DIAGNOSIS — Z789 Other specified health status: Secondary | ICD-10-CM

## 2016-05-05 DIAGNOSIS — Z79899 Other long term (current) drug therapy: Secondary | ICD-10-CM | POA: Diagnosis not present

## 2016-05-05 DIAGNOSIS — R3915 Urgency of urination: Secondary | ICD-10-CM

## 2016-05-05 DIAGNOSIS — Z91199 Patient's noncompliance with other medical treatment and regimen due to unspecified reason: Secondary | ICD-10-CM

## 2016-05-05 MED ORDER — ESCITALOPRAM OXALATE 20 MG PO TABS: 20.0000 mg | ORAL_TABLET | Freq: Every day | ORAL | 3 refills | Status: DC

## 2016-05-05 NOTE — Patient Instructions (Addendum)
Your blood pressure is too hight today but you haven t taken med today and   Last pm   It is very important to  take the medication as prescribed .   To be able to see if  Working or not.   Alcohol decreases good sleep .  Will be contacted about urology consult.   increas the lexapro to 20 mg per day  .  consideration of change to effexor , cymbalta pristique type meds  May  Get better effect .  ROV in  6 weeks  Again .

## 2016-05-17 ENCOUNTER — Encounter: Payer: Self-pay | Admitting: Gastroenterology

## 2016-05-17 ENCOUNTER — Ambulatory Visit (AMBULATORY_SURGERY_CENTER): Payer: Federal, State, Local not specified - PPO | Admitting: Gastroenterology

## 2016-05-17 VITALS — BP 135/70 | HR 48 | Temp 98.2°F | Resp 21 | Ht 67.0 in | Wt 226.0 lb

## 2016-05-17 DIAGNOSIS — D129 Benign neoplasm of anus and anal canal: Principal | ICD-10-CM

## 2016-05-17 DIAGNOSIS — Z86010 Personal history of colonic polyps: Secondary | ICD-10-CM | POA: Diagnosis present

## 2016-05-17 DIAGNOSIS — K635 Polyp of colon: Secondary | ICD-10-CM

## 2016-05-17 DIAGNOSIS — D128 Benign neoplasm of rectum: Secondary | ICD-10-CM

## 2016-05-17 MED ORDER — SODIUM CHLORIDE 0.9 % IV SOLN: 500.0000 mL | INTRAVENOUS | Status: DC

## 2016-05-17 NOTE — Progress Notes (Signed)
Called to room to assist during endoscopic procedure.  Patient ID and intended procedure confirmed with present staff. Received instructions for my participation in the procedure from the performing physician.  

## 2016-05-17 NOTE — Patient Instructions (Signed)
YOU HAD AN ENDOSCOPIC PROCEDURE TODAY AT [facility]:   Refer to the procedure report that was given to you for any specific questions about what was found during the examination.  If the procedure report does not answer your questions, please call your gastroenterologist to clarify.  If you requested that your care partner not be given the details of your procedure findings, then the procedure report has been included in a sealed envelope for you to review at your convenience later.  YOU SHOULD EXPECT: Some feelings of bloating in the abdomen. Passage of more gas than usual.  Walking can help get rid of the air that was put into your GI tract during the procedure and reduce the bloating. If you had a lower endoscopy (such as a colonoscopy or flexible sigmoidoscopy) you may notice spotting of blood in your stool or on the toilet paper. If you underwent a bowel prep for your procedure, you may not have a normal bowel movement for a few days.  Please Note:  You might notice some irritation and congestion in your nose or some drainage.  This is from the oxygen used during your procedure.  There is no need for concern and it should clear up in a day or so.  SYMPTOMS TO REPORT IMMEDIATELY:   Following lower endoscopy (colonoscopy or flexible sigmoidoscopy):  Excessive amounts of blood in the stool  Significant tenderness or worsening of abdominal pains  Swelling of the abdomen that is new, acute  Fever of 100F or higher   Following upper endoscopy (EGD)  Vomiting of blood or coffee ground material  New chest pain or pain under the shoulder blades  Painful or persistently difficult swallowing  New shortness of breath  Fever of 100F or higher  Black, tarry-looking stools  For urgent or emergent issues, a gastroenterologist can be reached at any hour by calling [phone].   DIET:  We do recommend a small meal at first, but then you may proceed to your regular diet.  Drink  plenty of fluids but you should avoid alcoholic beverages for 24 hours.  ACTIVITY:  You should plan to take it easy for the rest of today and you should NOT DRIVE or use heavy machinery until tomorrow (because of the sedation medicines used during the test).    FOLLOW UP: Our staff will call the number listed on your records the next business day following your procedure to check on you and address any questions or concerns that you may have regarding the information given to you following your procedure. If we do not reach you, we will leave a message.  However, if you are feeling well and you are not experiencing any problems, there is no need to return our call.  We will assume that you have returned to your regular daily activities without incident.  If any biopsies were taken you will be contacted by phone or by letter within the next 1-3 weeks.  Please call us at [phone] if you have not heard about the biopsies in 3 weeks.    SIGNATURES/CONFIDENTIALITY: You and/or your care partner have signed paperwork which will be entered into your electronic medical record.  These signatures attest to the fact that that the information above on your After Visit Summary has been reviewed and is understood.  Full responsibility of the confidentiality of this discharge information lies with you and/or your care-partner.   Handouts were given to your care partner on polyps,  and hemorrhoids. You may resume your current medications today. Await biopsy results. Please call if any questions or concerns.

## 2016-05-17 NOTE — Progress Notes (Signed)
No problems noted in the recovery room. maw 

## 2016-05-17 NOTE — Op Note (Signed)
[facility] Patient Name: Jaime Orr Procedure Date: 05/17/2016 11:05 AM MRN: [id number] Endoscopist: Mauri Pole , MD Age: 65 Referring MD:  Date of Birth: [birth year] Gender: Male Account #: [id number] Procedure:                Colonoscopy Indications:              Surveillance: Personal history of adenomatous                            polyps on last colonoscopy > 5 years ago Medicines:                Monitored Anesthesia Care Procedure:                Pre-Anesthesia Assessment:                           - Prior to the procedure, a History and Physical                            was performed, and patient medications and                            allergies were reviewed. The patient's tolerance of                            previous anesthesia was also reviewed. The risks                            and benefits of the procedure and the sedation                            options and risks were discussed with the patient.                            All questions were answered, and informed consent                            was obtained. Prior Anticoagulants: The patient has                            taken no previous anticoagulant or antiplatelet                            agents. ASA Grade Assessment: II - A patient with                            mild systemic disease. After reviewing the risks                            and benefits, the patient was deemed in                            satisfactory condition to undergo the procedure.  After obtaining informed consent, the colonoscope                            was passed under direct vision. Throughout the                            procedure, the patient's blood pressure, pulse, and                            oxygen saturations were monitored continuously. The                            Model CF-HQ190L [id number]) scope was introduced                            through the anus  and advanced to the the terminal                            ileum, with identification of the appendiceal                            orifice and IC valve. The colonoscopy was performed                            without difficulty. The patient tolerated the                            procedure well. The quality of the bowel                            preparation was excellent. The ileocecal valve,                            appendiceal orifice, and rectum were photographed. Scope In: 11:27:30 AM Scope Out: 11:42:02 AM Scope Withdrawal Time: 0 hours 10 minutes 21 seconds  Total Procedure Duration: 0 hours 14 minutes 32 seconds  Findings:                 The perianal and digital rectal examinations were                            normal.                           A 7 mm polyp was found in the rectum. The polyp was                            flat. The polyp was removed with a cold snare.                            Resection and retrieval were complete.                           A 2 mm polyp was found in the rectum. The  polyp was                            sessile. The polyp was removed with a cold biopsy                            forceps. Resection and retrieval were complete.                           Non-bleeding internal hemorrhoids were found during                            retroflexion. The hemorrhoids were small.                           The exam was otherwise without abnormality. Complications:            No immediate complications. Estimated Blood Loss:     Estimated blood loss was minimal. Impression:               - One 7 mm polyp in the rectum, removed with a cold                            snare. Resected and retrieved.                           - One 2 mm polyp in the rectum, removed with a cold                            biopsy forceps. Resected and retrieved.                           - Non-bleeding internal hemorrhoids.                           - The examination was  otherwise normal. Recommendation:           - Patient has a contact number available for                            emergencies. The signs and symptoms of potential                            delayed complications were discussed with the                            patient. Return to normal activities tomorrow.                            Written discharge instructions were provided to the                            patient.                           - Resume previous diet.                           -  Continue present medications.                           - Await pathology results.                           - Repeat colonoscopy in 5 years for surveillance                            based on pathology results.                           - Return to GI clinic PRN. Mauri Pole, MD 05/17/2016 11:47:57 AM This report has been signed electronically.

## 2016-05-17 NOTE — Progress Notes (Signed)
Transferred patient to recovery awake alert VSS Report to RN

## 2016-05-18 ENCOUNTER — Telehealth: Payer: Self-pay | Admitting: *Deleted

## 2016-05-18 NOTE — Telephone Encounter (Signed)
  Follow up Call-  Call back number 05/17/2016  Post procedure Call Back phone  # [phone]  Permission to leave phone message Yes  Some recent data might be hidden     Patient questions:  Do you have a fever, pain , or abdominal swelling? No. Pain Score  0 *  Have you tolerated food without any problems? Yes.    Have you been able to return to your normal activities? Yes.    Do you have any questions about your discharge instructions: Diet   No. Medications  No. Follow up visit  No.  Do you have questions or concerns about your Care? No.  Actions: * If pain score is 4 or above: No action needed, pain <4.

## 2016-05-22 ENCOUNTER — Encounter: Payer: Self-pay | Admitting: Internal Medicine

## 2016-05-24 ENCOUNTER — Encounter: Payer: Self-pay | Admitting: Gastroenterology

## 2016-05-29 ENCOUNTER — Other Ambulatory Visit: Payer: Self-pay | Admitting: Internal Medicine

## 2016-05-31 NOTE — Telephone Encounter (Signed)
DENIED.  FILLED FOR 9 MONTHS ON 12/06/15.  REQUEST IS TOO EARLY

## 2016-06-15 NOTE — Progress Notes (Signed)
Pre visit review using our clinic review tool, if applicable. No additional management support is needed unless otherwise documented below in the visit note.  Chief Complaint  Patient presents with  . Follow-up    HPI: Jaime Orr 65 y.o. patient comes in today with plans for follow-up of mood medication and hypertension. He comes in with some readings of blood pressure 200 and he is taking his carvedilol whole pill twice a day. Blood pressure reading in the mornings second read of the readings 6 are in range 912 133/74 this morning was 146/93. He is becoming more active around the house has increased the Lexapro and feels his mood is a bit better. Alcohol is about the same. He is continuing not working for medical reasons. Needs a refill of his carvedilol  for 90 days. When went to donate blood about a month ago he did pass. His previous blood donation about 3 months before. Had colonoscopy. Would like to get off the Nexium.     ROS: See pertinent positives and negatives per HPI. No cp sob  No falling   Got a new truck and being more active around the hosue   Has dog to walk   Past Medical History:  Diagnosis Date  . Anxiety   . Cancer ([place])    skin cancer  . DEPRESSION 12/02/2008  . GERD 12/02/2008    no endo  . Heart murmur    born with  . 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 HYPERGLYCEMIA, MILD 01/19/2010  . HYPERTENSION 12/02/2008  . Hypertension   . IRON DEFICIENCY 07/18/2010   doantes blood  . Muscle fasciculation 11/14/2010  . Muscle twitch 11/14/2010   Left rm  Poss overuse  R/o metabolic    . OBESITY 08/09/2010  . SNORING 07/18/2010    Family History  Problem Relation Age of Onset  . Stroke Mother     33  . Depression Mother   . Hypertension Father   . Heart disease Father     37  . Alcohol abuse Father   . Colon polyps Father   . Sleep apnea Sister   . Colon cancer Neg Hx   . Esophageal cancer Neg Hx   . Rectal cancer Neg Hx   . Stomach  cancer Neg Hx     Social History   Social History  . Marital status: Married    Spouse name: N/A  . Number of children: N/A  . Years of education: N/A   Social History Main Topics  . Smoking status: Former Research scientist (life sciences)  . Smokeless tobacco: Never Used  . Alcohol use 8.4 oz/week    14 Cans of beer per week     Comment: daily 2-3 beers/day  . Drug use: No  . Sexual activity: Not Asked   Other Topics Concern  . 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   Originally from [place]  To [place] of 2 dogs and cats    Donates blood          Outpatient Medications Prior to Visit  Medication Sig Dispense Refill  . amLODipine (NORVASC) 10 MG tablet Take 1 tablet (10 mg  total) by mouth daily. 90 tablet 2  . escitalopram (LEXAPRO) 20 MG tablet Take 1 tablet (20 mg total) by mouth daily. 30 tablet 3  . esomeprazole (NEXIUM) 40 MG capsule TAKE 1 CAPSULE DAILY BEFOREBREAKFAST 90 capsule 2  . potassium chloride SA (KLOR-CON M20) 20 MEQ tablet Take 1 tablet (20 mEq total) by mouth 2 (two) times daily. 180 tablet 2  . tadalafil (CIALIS) 5 MG tablet TAKE 1 TABLET DAILY FOR    BENIGN PROSTATIC           HYPERTROPHY 90 tablet 2  . valsartan-hydrochlorothiazide (DIOVAN-HCT) 320-25 MG tablet Take 1 tablet by mouth daily. 90 tablet 2  . carvedilol (COREG) 25 MG tablet Take 1 tablet (25 mg total) by mouth 2 (two) times daily with a meal. (Patient taking differently: Take 50 mg by mouth 2 (two) times daily with a meal. ) 180 tablet 3   Facility-Administered Medications Prior to Visit  Medication Dose Route Frequency Provider Last Rate Last Dose  . 0.9 %  sodium chloride infusion  500 mL Intravenous Continuous Mauri Pole, MD      . fentaNYL (SUBLIMAZE) injection 25-50 mcg  25-50 mcg  Intravenous Q5 min PRN Myrtie Soman, MD      . promethazine (PHENERGAN) injection 6.25-12.5 mg  6.25-12.5 mg Intravenous Q15 min PRN Myrtie Soman, MD         EXAM:  BP 136/86 (BP Location: Right Arm, Patient Position: Sitting, Cuff Size: Large)   Temp 98.8 F (37.1 C) (Oral)   Wt 227 lb 4.8 oz (103.1 kg)   BMI 35.60 kg/m   Body mass index is 35.6 kg/m.  GENERAL: vitals reviewed and listed above, alert, oriented, appears well hydrated and in no acute distress HEENT: atraumatic, conjunctiva  clear, no obvious abnormalities on inspection of external nose and ears   NECK: no obvious masses on inspection palpation  LUNGS: clear to auscultation bilaterally, no wheezes, rales or rhonchi, good air movement CV: HRRR, no clubbing cyanosis or  peripheral edema nl cap refill  MS: moves all extremities without noticeable focal  abnormality PSYCH: pleasant and cooperative, Looks less depressed today. Blood pressure repeated and at goal.  ASSESSMENT AND PLAN:  Discussed the following assessment and plan:  Resistant hypertension ? prob non adherance to med - Improved so far. Plan follow-up in 2 months. No change in medicine higher dose carvedilol  Adjustment disorder with mixed anxiety and depressed mood - Improved stay on the same meds.  Heavy alcohol use  Need for prophylactic vaccination and inoculation against influenza - Plan: Flu Vaccine QUAD 36+ mos PF IM (Fluarix & Fluzone Quad PF)  Medication management  Stress and adjustment reaction  Essential hypertension Total visit 51mins > 50% spent counseling and coordinating care as indicated in above note and in instructions to patient .   -Patient advised to return or notify health care team  if symptoms worsen ,persist or new concerns arise.  Patient Instructions   Continue activity in the same medication. Try decreasing the Nexium to every other or every third day. On those days take over-the-counter ranitidine or Zantac 150 mg  twice a day to cover for stomach acid. Continue checking blood pressure readings take 5-10 days out of a month twice a day readings after sitting and relaxing 3-5 minutes. Come back in about 2 months and we will reassess. It is possible that the Nexium is decreasing absorption of the iron so that you cannot donate blood as often as she  used to. Will follow.  Wt Readings from Last 3 Encounters:  06/16/16 227 lb 4.8 oz (103.1 kg)  05/17/16 226 lb (102.5 kg)  05/05/16 227 lb 9.6 oz (103.2 kg)   BP Readings from Last 3 Encounters:  06/16/16 136/86  05/17/16 135/70  05/05/16 (!) 168/80       Mariann Laster K. Panosh M.D.

## 2016-06-16 ENCOUNTER — Ambulatory Visit (INDEPENDENT_AMBULATORY_CARE_PROVIDER_SITE_OTHER): Payer: Federal, State, Local not specified - PPO | Admitting: Internal Medicine

## 2016-06-16 ENCOUNTER — Encounter: Payer: Self-pay | Admitting: Internal Medicine

## 2016-06-16 ENCOUNTER — Encounter: Payer: Self-pay | Admitting: Family Medicine

## 2016-06-16 ENCOUNTER — Telehealth: Payer: Self-pay | Admitting: Internal Medicine

## 2016-06-16 VITALS — BP 136/86 | Temp 98.8°F | Wt 227.3 lb

## 2016-06-16 DIAGNOSIS — Z79899 Other long term (current) drug therapy: Secondary | ICD-10-CM | POA: Diagnosis not present

## 2016-06-16 DIAGNOSIS — Z789 Other specified health status: Secondary | ICD-10-CM | POA: Diagnosis not present

## 2016-06-16 DIAGNOSIS — Z23 Encounter for immunization: Secondary | ICD-10-CM | POA: Diagnosis not present

## 2016-06-16 DIAGNOSIS — F1090 Alcohol use, unspecified, uncomplicated: Secondary | ICD-10-CM

## 2016-06-16 DIAGNOSIS — I10 Essential (primary) hypertension: Secondary | ICD-10-CM | POA: Diagnosis not present

## 2016-06-16 DIAGNOSIS — F4323 Adjustment disorder with mixed anxiety and depressed mood: Secondary | ICD-10-CM

## 2016-06-16 DIAGNOSIS — F4329 Adjustment disorder with other symptoms: Secondary | ICD-10-CM

## 2016-06-16 MED ORDER — CARVEDILOL 25 MG PO TABS: 25.0000 mg | ORAL_TABLET | Freq: Two times a day (BID) | ORAL | 3 refills | Status: DC

## 2016-06-16 NOTE — Telephone Encounter (Signed)
Pt would like to access the work note on [employer]. Can you make that happen?  Thank you!!

## 2016-06-16 NOTE — Telephone Encounter (Signed)
Pt notified that MyChart may update at midnight and then letter will be in chart.  Unfortunately, no button for me to push to realease to MyChart.

## 2016-06-16 NOTE — Patient Instructions (Addendum)
Continue activity in the same medication. Try decreasing the Nexium to every other or every third day. On those days take over-the-counter ranitidine or Zantac 150 mg twice a day to cover for stomach acid. Continue checking blood pressure readings take 5-10 days out of a month twice a day readings after sitting and relaxing 3-5 minutes. Come back in about 2 months and we will reassess. It is possible that the Nexium is decreasing absorption of the iron so that you cannot donate blood as often as she used to. Will follow.  Wt Readings from Last 3 Encounters:  06/16/16 227 lb 4.8 oz (103.1 kg)  05/17/16 226 lb (102.5 kg)  05/05/16 227 lb 9.6 oz (103.2 kg)   BP Readings from Last 3 Encounters:  06/16/16 136/86  05/17/16 135/70  05/05/16 (!) 168/80

## 2016-07-20 ENCOUNTER — Other Ambulatory Visit: Payer: Self-pay | Admitting: Family Medicine

## 2016-07-20 MED ORDER — ESCITALOPRAM OXALATE 20 MG PO TABS: 20.0000 mg | ORAL_TABLET | Freq: Every day | ORAL | 0 refills | Status: DC

## 2016-07-20 NOTE — Telephone Encounter (Signed)
Received request from the pharmacy for a 90 day supply.  Sent in #90.  Pt has follow up scheduled for 08/17/16.  Last yearly was 10/21/15.  Pt due for yearly soon.

## 2016-07-26 NOTE — Progress Notes (Signed)
Pre visit review using our clinic review tool, if applicable. No additional management support is needed unless otherwise documented below in the visit note.  Chief Complaint  Patient presents with  . Follow-up    HPI: Jaime Orr 65 y.o. follow-up of labile hypertension medication management stress. Feels that he is doing "GOOD"  Going to retire     Now   Dec 1 st  .      With vacation . Left over leave  BP : not doing  As much checking taking med however  bp taking regular  And not  Checking readings       Before last visit and not recently   Mood   Sick leave   But  Doing better   consider part time  Mood :  Better  Because wants to get to a normal routine.    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     etoh last week. 3-4 per day. No he needs to cut down starts to have a beer at about 5:00 when he starts cooking and has about 3 a night. Maximum is 5 to her. On the weekend. He states that father and brother were high level functioning alcoholics. Father did have cirrhosis and heart disease. He died in his 71s. ROS: See pertinent positives and negatives per HPI.  Past Medical History:  Diagnosis Date  . Anxiety   . Cancer ([place])    skin cancer  . DEPRESSION 12/02/2008  . GERD 12/02/2008    no endo  . Heart murmur    born with  . 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 HYPERGLYCEMIA, MILD 01/19/2010  . HYPERTENSION 12/02/2008  . Hypertension   . IRON DEFICIENCY 07/18/2010   doantes blood  . Muscle fasciculation 11/14/2010  . Muscle twitch 11/14/2010   Left rm  Poss overuse  R/o metabolic    . OBESITY 08/09/2010  . SNORING 07/18/2010    Family History  Problem Relation Age of Onset  . Stroke Mother     82  . Depression Mother   . Hypertension Father   . Heart disease Father     67  . Alcohol abuse Father   . Colon polyps Father   . Sleep apnea Sister   . Colon cancer Neg Hx   . Esophageal cancer Neg Hx   . Rectal cancer Neg Hx   . Stomach cancer Neg Hx      Social History   Social History  . Marital status: Married    Spouse name: N/A  . Number of children: N/A  . Years of education: N/A   Social History Main Topics  . Smoking status: Former Research scientist (life sciences)  . Smokeless tobacco: Never Used  . Alcohol use 8.4 oz/week    14 Cans of beer per week     Comment: daily 2-3 beers/day  . Drug use: No  . Sexual activity: Not Asked   Other Topics Concern  . 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   Originally from [place]  To [place] of 2 dogs and cats    Donates blood  Outpatient Medications Prior to Visit  Medication Sig Dispense Refill  . amLODipine (NORVASC) 10 MG tablet Take 1 tablet (10 mg total) by mouth daily. 90 tablet 2  . carvedilol (COREG) 25 MG tablet Take 1 tablet (25 mg total) by mouth 2 (two) times daily with a meal. 180 tablet 3  . esomeprazole (NEXIUM) 40 MG capsule TAKE 1 CAPSULE DAILY BEFOREBREAKFAST 90 capsule 2  . potassium chloride SA (KLOR-CON M20) 20 MEQ tablet Take 1 tablet (20 mEq total) by mouth 2 (two) times daily. 180 tablet 2  . tadalafil (CIALIS) 5 MG tablet TAKE 1 TABLET DAILY FOR    BENIGN PROSTATIC           HYPERTROPHY 90 tablet 2  . valsartan-hydrochlorothiazide (DIOVAN-HCT) 320-25 MG tablet Take 1 tablet by mouth daily. 90 tablet 2  . escitalopram (LEXAPRO) 20 MG tablet Take 1 tablet (20 mg total) by mouth daily. 90 tablet 0   Facility-Administered Medications Prior to Visit  Medication Dose Route Frequency Provider Last Rate Last Dose  . fentaNYL (SUBLIMAZE) injection 25-50 mcg  25-50 mcg Intravenous Q5 min PRN Myrtie Soman, MD      . promethazine (PHENERGAN) injection 6.25-12.5 mg  6.25-12.5 mg Intravenous Q15 min PRN Myrtie Soman, MD      . 0.9 %  sodium chloride infusion  500 mL  Intravenous Continuous Mauri Pole, MD         EXAM:  BP (!) 142/70 (BP Location: Right Arm, Cuff Size: Large)   Temp 98.5 F (36.9 C) (Oral)   Wt 229 lb 3.2 oz (104 kg)   BMI 35.90 kg/m   Body mass index is 35.9 kg/m.  GENERAL: vitals reviewed and listed above, alert, oriented, appears well hydrated and in no acute distressLooks generally well today nontoxic here by himself. HEENT: atraumatic, conjunctiva  clear, no obvious abnormalities on inspection of external nose and ears  NECK: no obvious masses on inspection palpation  LUNGS: clear to auscultation bilaterally, no wheezes, rales or rhonchi, good air movement CV: HRRR, no clubbing cyanosis or  peripheral edema nl cap refill  MS: moves all extremities without noticeable focal  abnormality PSYCH: pleasant and cooperative, no obvious depression or anxiety Lab Results  Component Value Date   WBC 5.1 10/05/2015   HGB 12.2 (L) 10/05/2015   HCT 38.1 (L) 10/05/2015   PLT 304.0 10/05/2015   GLUCOSE 105 (H) 10/05/2015   CHOL 206 (H) 10/05/2015   TRIG 116.0 10/05/2015   HDL 43.00 10/05/2015   LDLCALC 139 (H) 10/05/2015   ALT 21 10/05/2015   AST 14 10/05/2015   NA 140 10/05/2015   K 4.1 10/05/2015   CL 103 10/05/2015   CREATININE 0.84 10/05/2015   BUN 17 10/05/2015   CO2 31 10/05/2015   TSH 3.40 10/05/2015   PSA 1.60 10/05/2015   HGBA1C 5.8 10/05/2015   MICROALBUR 1.7 07/08/2010   ` ` `q Wt Readings from Last 3 Encounters:  07/27/16 229 lb 3.2 oz (104 kg)  06/16/16 227 lb 4.8 oz (103.1 kg)  05/17/16 226 lb (102.5 kg)   BP Readings from Last 3 Encounters:  07/27/16 (!) 142/70  06/16/16 136/86  05/17/16 135/70    ASSESSMENT AND PLAN:  Discussed the following assessment and plan:  Essential hypertension  Adjustment disorder with mixed anxiety and depressed mood  Medication management  Heavy alcohol use  BMI 33.0-33.9,adult History of labile hypertension in past possibly nonadherence states he is  taking his medicine regularly still has heavy  alcohol use. This is certainly affecting his blood pressure discussed having him stop be seen in hypertension clinic but he states that he notices alcohol and lifestyle is doing and will continue on medication and follow-up in January for CPX labs. He is hopeful after is officially retired he can do somewhat he feels is useful activity minor jobs working. Agree does not need to lose weight this would help cardiovascular health. Spent a good deal of time talking about his alcohol use and his family history he has to be accountable to help with his health. Try tracking and cutting back. Healthier habits and a new schedule. Asked about work form he can bring it in the more review it. Total visit 75mins > 50% spent counseling and coordinating care as indicated in above note and in instructions to patient .   -Patient advised to return or notify health care team  if symptoms worsen ,persist or new concerns arise.  Patient Instructions  Activity less alcohol .  As we discussed   dont want to get cirrohsis of the liver and liver failure .  Repeat bp was somewhat better  Plan  cpx   woith labs   In January     Mohsin Crum K. Marqueta Pulley M.D.

## 2016-07-27 ENCOUNTER — Ambulatory Visit (INDEPENDENT_AMBULATORY_CARE_PROVIDER_SITE_OTHER): Payer: Federal, State, Local not specified - PPO | Admitting: Internal Medicine

## 2016-07-27 ENCOUNTER — Encounter: Payer: Self-pay | Admitting: Internal Medicine

## 2016-07-27 VITALS — BP 142/70 | Temp 98.5°F | Wt 229.2 lb

## 2016-07-27 DIAGNOSIS — Z79899 Other long term (current) drug therapy: Secondary | ICD-10-CM | POA: Diagnosis not present

## 2016-07-27 DIAGNOSIS — Z7289 Other problems related to lifestyle: Secondary | ICD-10-CM

## 2016-07-27 DIAGNOSIS — Z789 Other specified health status: Secondary | ICD-10-CM

## 2016-07-27 DIAGNOSIS — F4323 Adjustment disorder with mixed anxiety and depressed mood: Secondary | ICD-10-CM

## 2016-07-27 DIAGNOSIS — I10 Essential (primary) hypertension: Secondary | ICD-10-CM

## 2016-07-27 DIAGNOSIS — Z6833 Body mass index (BMI) 33.0-33.9, adult: Secondary | ICD-10-CM

## 2016-07-27 DIAGNOSIS — F1090 Alcohol use, unspecified, uncomplicated: Secondary | ICD-10-CM

## 2016-07-27 MED ORDER — ESCITALOPRAM OXALATE 20 MG PO TABS: 20.0000 mg | ORAL_TABLET | Freq: Every day | ORAL | 1 refills | Status: DC

## 2016-07-27 NOTE — Patient Instructions (Signed)
Activity less alcohol .  As we discussed   dont want to get cirrohsis of the liver and liver failure .  Repeat bp was somewhat better  Plan  cpx   woith labs   In January

## 2016-08-16 DIAGNOSIS — I10 Essential (primary) hypertension: Secondary | ICD-10-CM

## 2016-08-16 DIAGNOSIS — F430 Acute stress reaction: Secondary | ICD-10-CM

## 2016-08-17 ENCOUNTER — Ambulatory Visit: Payer: Federal, State, Local not specified - PPO | Admitting: Internal Medicine

## 2016-09-23 ENCOUNTER — Other Ambulatory Visit: Payer: Self-pay | Admitting: Internal Medicine

## 2016-09-26 ENCOUNTER — Other Ambulatory Visit: Payer: Self-pay | Admitting: Family Medicine

## 2016-09-26 ENCOUNTER — Telehealth: Payer: Self-pay | Admitting: Family Medicine

## 2016-09-26 DIAGNOSIS — R739 Hyperglycemia, unspecified: Secondary | ICD-10-CM

## 2016-09-26 DIAGNOSIS — Z0000 Encounter for general adult medical examination without abnormal findings: Secondary | ICD-10-CM

## 2016-09-26 NOTE — Telephone Encounter (Signed)
Pt is due for cpx and fasting lab work in Jan 2018.  I have placed the lab orders.  Please help the pt to make both appointments.  Thanks!!

## 2016-09-26 NOTE — Telephone Encounter (Signed)
Sent to the pharmacy by e-scribe for 90 days.  Pt due for cpx and lab work in Jan 2018.  Message sent to scheduling.

## 2016-09-26 NOTE — Telephone Encounter (Signed)
lmom for pt to call back

## 2016-10-03 NOTE — Telephone Encounter (Signed)
lmom for pt to call back

## 2016-10-06 NOTE — Telephone Encounter (Signed)
lmom for pt to call back

## 2016-10-23 ENCOUNTER — Ambulatory Visit (INDEPENDENT_AMBULATORY_CARE_PROVIDER_SITE_OTHER): Payer: Federal, State, Local not specified - PPO | Admitting: Internal Medicine

## 2016-10-23 ENCOUNTER — Encounter: Payer: Self-pay | Admitting: Internal Medicine

## 2016-10-23 VITALS — BP 158/86 | HR 62 | Temp 99.1°F | Wt 224.0 lb

## 2016-10-23 DIAGNOSIS — Z0000 Encounter for general adult medical examination without abnormal findings: Secondary | ICD-10-CM | POA: Diagnosis not present

## 2016-10-23 DIAGNOSIS — R739 Hyperglycemia, unspecified: Secondary | ICD-10-CM

## 2016-10-23 DIAGNOSIS — R05 Cough: Secondary | ICD-10-CM | POA: Diagnosis not present

## 2016-10-23 DIAGNOSIS — R053 Chronic cough: Secondary | ICD-10-CM

## 2016-10-23 DIAGNOSIS — I10 Essential (primary) hypertension: Secondary | ICD-10-CM

## 2016-10-23 LAB — CBC WITH DIFFERENTIAL/PLATELET
BASOS PCT: 1.2 % (ref 0.0–3.0)
Basophils Absolute: 0.1 10*3/uL (ref 0.0–0.1)
EOS ABS: 0.3 10*3/uL (ref 0.0–0.7)
EOS PCT: 6.5 % — AB (ref 0.0–5.0)
HCT: 39.6 % (ref 39.0–52.0)
Hemoglobin: 13.4 g/dL (ref 13.0–17.0)
LYMPHS ABS: 1.8 10*3/uL (ref 0.7–4.0)
Lymphocytes Relative: 36.6 % (ref 12.0–46.0)
MCHC: 33.9 g/dL (ref 30.0–36.0)
MCV: 80.1 fl (ref 78.0–100.0)
MONO ABS: 0.5 10*3/uL (ref 0.1–1.0)
Monocytes Relative: 11.4 % (ref 3.0–12.0)
NEUTROS PCT: 44.3 % (ref 43.0–77.0)
Neutro Abs: 2.1 10*3/uL (ref 1.4–7.7)
Platelets: 251 10*3/uL (ref 150.0–400.0)
RBC: 4.94 Mil/uL (ref 4.22–5.81)
RDW: 17 % — AB (ref 11.5–15.5)
WBC: 4.8 10*3/uL (ref 4.0–10.5)

## 2016-10-23 LAB — HEPATIC FUNCTION PANEL
ALK PHOS: 50 U/L (ref 39–117)
ALT: 25 U/L (ref 0–53)
AST: 16 U/L (ref 0–37)
Albumin: 3.9 g/dL (ref 3.5–5.2)
BILIRUBIN DIRECT: 0.1 mg/dL (ref 0.0–0.3)
Total Bilirubin: 0.3 mg/dL (ref 0.2–1.2)
Total Protein: 6.7 g/dL (ref 6.0–8.3)

## 2016-10-23 LAB — LIPID PANEL
CHOLESTEROL: 186 mg/dL (ref 0–200)
HDL: 34.2 mg/dL — ABNORMAL LOW (ref >39.00)
LDL Cholesterol: 124 mg/dL — ABNORMAL HIGH (ref 0–99)
NonHDL: 151.52
TRIGLYCERIDES: 140 mg/dL (ref 0.0–149.0)
Total CHOL/HDL Ratio: 5
VLDL: 28 mg/dL (ref 0.0–40.0)

## 2016-10-23 LAB — BASIC METABOLIC PANEL
BUN: 19 mg/dL (ref 6–23)
CALCIUM: 9.2 mg/dL (ref 8.4–10.5)
CO2: 32 meq/L (ref 19–32)
Chloride: 105 mEq/L (ref 96–112)
Creatinine, Ser: 0.95 mg/dL (ref 0.40–1.50)
GFR: 85.02 mL/min (ref >60.00)
Glucose, Bld: 97 mg/dL (ref 70–99)
Potassium: 3.9 mEq/L (ref 3.5–5.1)
SODIUM: 143 meq/L (ref 135–145)

## 2016-10-23 LAB — HEMOGLOBIN A1C: Hgb A1c MFr Bld: 5.9 % (ref 4.6–6.5)

## 2016-10-23 LAB — PSA: PSA: 1.87 ng/mL (ref 0.10–4.00)

## 2016-10-23 LAB — TSH: TSH: 1.85 u[IU]/mL (ref 0.35–4.50)

## 2016-10-23 MED ORDER — BENZONATATE 100 MG PO CAPS: 100.0000 mg | ORAL_CAPSULE | Freq: Three times a day (TID) | ORAL | 0 refills | Status: DC | PRN

## 2016-10-23 NOTE — Progress Notes (Signed)
Pre visit review using our clinic review tool, if applicable. No additional management support is needed unless otherwise documented below in the visit note.  Chief Complaint  Patient presents with  . Cough    X3weeks.  Getting better. Sometimes productive.    HPI: Jaime Orr 66 y.o.  sda   For cough  Of 3 weeks    Onset like a flu and lingering cough  And taking meds dayquil nyquil . Slightly better .   No sob  No wheezing  .   Now  Productive cough  Mucouse.  noblood . No fever   Less coughing at night.  Tobacco No fever sweats sob  Now  Fully retired .   Working  Part time jobs    [employer]     12 - 20 .   nottaken bp pills  yet today .  Readings had been better ? Amount  ROS: See pertinent positives and negatives per HPI. No hemoptysis  No sob except with fits   etoh no change? Remote tobacco 20 years no current  Past Medical History:  Diagnosis Date  . Anxiety   . Cancer ([place])    skin cancer  . DEPRESSION 12/02/2008  . GERD 12/02/2008    no endo  . Heart murmur    born with  . 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 HYPERGLYCEMIA, MILD 01/19/2010  . HYPERTENSION 12/02/2008  . Hypertension   . IRON DEFICIENCY 07/18/2010   doantes blood  . Muscle fasciculation 11/14/2010  . Muscle twitch 11/14/2010   Left rm  Poss overuse  R/o metabolic    . OBESITY 08/09/2010  . SNORING 07/18/2010    Family History  Problem Relation Age of Onset  . Stroke Mother     22  . Depression Mother   . Hypertension Father   . Heart disease Father     78  . Alcohol abuse Father   . Colon polyps Father   . Sleep apnea Sister   . Colon cancer Neg Hx   . Esophageal cancer Neg Hx   . Rectal cancer Neg Hx   . Stomach cancer Neg Hx     Social History   Social History  . Marital status: Married    Spouse name: N/A  . Number of children: N/A  . Years of education: N/A   Social History Main Topics  . Smoking status: Former Research scientist (life sciences)  . Smokeless tobacco: Never Used  . Alcohol  use 8.4 oz/week    14 Cans of beer per week     Comment: daily 2-3 beers/day  . Drug use: No  . Sexual activity: Not Asked   Other Topics Concern  . 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   Originally from [place]  To [place] of 2 dogs and cats    Donates blood          Outpatient Medications Prior to Visit  Medication Sig Dispense Refill  . amLODipine (NORVASC) 10 MG tablet TAKE 1 TABLET DAILY 90 tablet 0  . carvedilol (COREG) 25 MG tablet Take 1 tablet (25 mg total) by mouth 2 (two) times daily with a  meal. 180 tablet 3  . escitalopram (LEXAPRO) 20 MG tablet Take 1 tablet (20 mg total) by mouth daily. 90 tablet 1  . esomeprazole (NEXIUM) 40 MG capsule TAKE 1 CAPSULE DAILY BEFOREBREAKFAST 90 capsule 0  . KLOR-CON M20 20 MEQ tablet TAKE 1 TABLET TWICE A DAY 180 tablet 0  . potassium chloride SA (KLOR-CON M20) 20 MEQ tablet Take 1 tablet (20 mEq total) by mouth 2 (two) times daily. 180 tablet 2  . valsartan-hydrochlorothiazide (DIOVAN-HCT) 320-25 MG tablet TAKE 1 TABLET DAILY 90 tablet 0  . valsartan-hydrochlorothiazide (DIOVAN-HCT) 320-25 MG tablet Take 1 tablet by mouth daily. 90 tablet 2  . amLODipine (NORVASC) 10 MG tablet Take 1 tablet (10 mg total) by mouth daily. 90 tablet 2  . tadalafil (CIALIS) 5 MG tablet TAKE 1 TABLET DAILY FOR    BENIGN PROSTATIC           HYPERTROPHY (Patient not taking: Reported on 10/23/2016) 90 tablet 2   Facility-Administered Medications Prior to Visit  Medication Dose Route Frequency Provider Last Rate Last Dose  . fentaNYL (SUBLIMAZE) injection 25-50 mcg  25-50 mcg Intravenous Q5 min PRN Myrtie Soman, MD      . promethazine (PHENERGAN) injection 6.25-12.5 mg  6.25-12.5 mg Intravenous Q15 min PRN Myrtie Soman, MD         EXAM:  BP (!)  158/86 (BP Location: Right Arm)   Pulse 62   Temp 99.1 F (37.3 C) (Oral)   Wt 224 lb (101.6 kg)   SpO2 97%   BMI 35.08 kg/m   Body mass index is 35.08 kg/m.  GENERAL: vitals reviewed and listed above, alert, oriented, appears well hydrated and in no acute distress ocass cough  HEENT: atraumatic, conjunctiva  clear, no obvious abnormalities on inspection of external nose and earstmx clear min nasal congestion  OP : no lesion edema or exudate  Mild redness  NECK: no obvious masses on inspection palpation  ocass cough  No dyspmea   LUNGS: clear to auscultation bilaterally, no wheezes, rales or rhonchi,  CV: HRRR, no clubbing cyanosis or  peripheral edema nl cap refill  MS: moves all extremities without noticeable focal  abnormality PSYCH: pleasant and cooperative, no obvious depression or anxiety BP Readings from Last 3 Encounters:  10/23/16 (!) 158/86  07/27/16 (!) 142/70  06/16/16 136/86   Wt Readings from Last 3 Encounters:  10/23/16 224 lb (101.6 kg)  07/27/16 229 lb 3.2 oz (104 kg)  06/16/16 227 lb 4.8 oz (103.1 kg)    ASSESSMENT AND PLAN:  Discussed the following assessment and plan:  Cough, persistent - Plan: DG Chest 2 View  Essential hypertension  Pv  labs  - to hav cpx next week  labs today - Plan: Basic metabolic panel, CBC with Differential/Platelet, Lipid panel, PSA, TSH, Hepatic function panel, Hemoglobin A1c  Hyperglycemia - Plan: Hemoglobin A1c Acts like convalescent persistent cough from viral respiratory infection. We discussed risk benefit of antibiotics at this time I think there is more risk than benefit. Keep his follow-up CPX next week consider chest x-ray if not continuing to improve her follow-up alarm symptoms. I'm more concerned about his blood pressure again hasn't taken his medicine today will follow-up next week. It's possible decongestants and cold medicines of great arise.  Continue  Reflux meds -Patient advised to return or notify health  care team  if symptoms worsen ,persist or new concerns arise.  Patient Instructions  I believe this is a postinfectious cough and agreed that you  needed to be evaluated today. However I see no evidence of pneumonia or bacterial infection or sinusitis. Continue fluids avoid respiratory irritants can try Tessalon Perles for comfort Avoid cough drops that have menthol and we'll send them as they can irritate the airway make the cough worse. Can try sugar-free candy instead. Warm liquids. If not continuing to improve into the next week before your neck visit go get the chest x-ray ordered at the [facility] office radiology department. Contact us if you get a fever or worsening this can be a sign of a bacterial infection or other process. Your blood pressure is up today take your medicine avoid cough medicines that have decongestants in them ] please read the labels.    Cough, Adult Coughing is a reflex that clears your throat and your airways. Coughing helps to heal and protect your lungs. It is normal to cough occasionally, but a cough that happens with other symptoms or lasts a long time may be a sign of a condition that needs treatment. A cough may last only 2-3 weeks (acute), or it may last longer than 8 weeks (chronic). What are the causes? Coughing is commonly caused by:  Breathing in substances that irritate your lungs.  A viral or bacterial respiratory infection.  Allergies.  Asthma.  Postnasal drip.  Smoking.  Acid backing up from the stomach into the esophagus (gastroesophageal reflux).  Certain medicines.  Chronic lung problems, including COPD (or rarely, lung cancer).  Other medical conditions such as heart failure. Follow these instructions at home: Pay attention to any changes in your symptoms. Take these actions to help with your discomfort:  Take medicines only as told by your health care provider.  If you were prescribed an antibiotic medicine, take it as told by  your health care provider. Do not stop taking the antibiotic even if you start to feel better.  Talk with your health care provider before you take a cough suppressant medicine.  Drink enough fluid to keep your urine clear or pale yellow.  If the air is dry, use a cold steam vaporizer or humidifier in your bedroom or your home to help loosen secretions.  Avoid anything that causes you to cough at work or at home.  If your cough is worse at night, try sleeping in a semi-upright position.  Avoid cigarette smoke. If you smoke, quit smoking. If you need help quitting, ask your health care provider.  Avoid caffeine.  Avoid alcohol.  Rest as needed. Contact a health care provider if:  You have new symptoms.  You cough up pus.  Your cough does not get better after 2-3 weeks, or your cough gets worse.  You cannot control your cough with suppressant medicines and you are losing sleep.  You develop pain that is getting worse or pain that is not controlled with pain medicines.  You have a fever.  You have unexplained weight loss.  You have night sweats. Get help right away if:  You cough up blood.  You have difficulty breathing.  Your heartbeat is very fast. This information is not intended to replace advice given to you by your health care provider. Make sure you discuss any questions you have with your health care provider. Document Released: 03/17/2011 Document Revised: 02/24/2016 Document Reviewed: 11/25/2014 Elsevier Interactive Patient Education  2017 [place] [employer]. Nicolasa Milbrath M.D.

## 2016-10-23 NOTE — Patient Instructions (Addendum)
I believe this is a postinfectious cough and agreed that you needed to be evaluated today. However I see no evidence of pneumonia or bacterial infection or sinusitis. Continue fluids avoid respiratory irritants can try Tessalon Perles for comfort Avoid cough drops that have menthol and we'll send them as they can irritate the airway make the cough worse. Can try sugar-free candy instead. Warm liquids. If not continuing to improve into the next week before your neck visit go get the chest x-ray ordered at the [facility] office radiology department. Contact us if you get a fever or worsening this can be a sign of a bacterial infection or other process. Your blood pressure is up today take your medicine avoid cough medicines that have decongestants in them ] please read the labels.    Cough, Adult Coughing is a reflex that clears your throat and your airways. Coughing helps to heal and protect your lungs. It is normal to cough occasionally, but a cough that happens with other symptoms or lasts a long time may be a sign of a condition that needs treatment. A cough may last only 2-3 weeks (acute), or it may last longer than 8 weeks (chronic). What are the causes? Coughing is commonly caused by:  Breathing in substances that irritate your lungs.  A viral or bacterial respiratory infection.  Allergies.  Asthma.  Postnasal drip.  Smoking.  Acid backing up from the stomach into the esophagus (gastroesophageal reflux).  Certain medicines.  Chronic lung problems, including COPD (or rarely, lung cancer).  Other medical conditions such as heart failure. Follow these instructions at home: Pay attention to any changes in your symptoms. Take these actions to help with your discomfort:  Take medicines only as told by your health care provider.  If you were prescribed an antibiotic medicine, take it as told by your health care provider. Do not stop taking the antibiotic even if you start to feel  better.  Talk with your health care provider before you take a cough suppressant medicine.  Drink enough fluid to keep your urine clear or pale yellow.  If the air is dry, use a cold steam vaporizer or humidifier in your bedroom or your home to help loosen secretions.  Avoid anything that causes you to cough at work or at home.  If your cough is worse at night, try sleeping in a semi-upright position.  Avoid cigarette smoke. If you smoke, quit smoking. If you need help quitting, ask your health care provider.  Avoid caffeine.  Avoid alcohol.  Rest as needed. Contact a health care provider if:  You have new symptoms.  You cough up pus.  Your cough does not get better after 2-3 weeks, or your cough gets worse.  You cannot control your cough with suppressant medicines and you are losing sleep.  You develop pain that is getting worse or pain that is not controlled with pain medicines.  You have a fever.  You have unexplained weight loss.  You have night sweats. Get help right away if:  You cough up blood.  You have difficulty breathing.  Your heartbeat is very fast. This information is not intended to replace advice given to you by your health care provider. Make sure you discuss any questions you have with your health care provider. Document Released: 03/17/2011 Document Revised: 02/24/2016 Document Reviewed: 11/25/2014 Elsevier Interactive Patient Education  2017 [employer].

## 2016-10-28 ENCOUNTER — Other Ambulatory Visit: Payer: Self-pay | Admitting: Internal Medicine

## 2016-10-30 ENCOUNTER — Ambulatory Visit (INDEPENDENT_AMBULATORY_CARE_PROVIDER_SITE_OTHER)
Admission: RE | Admit: 2016-10-30 | Discharge: 2016-10-30 | Disposition: A | Discharge status: Home / Self Care | Payer: Federal, State, Local not specified - PPO | Source: Ambulatory Visit | Attending: Internal Medicine | Admitting: Internal Medicine

## 2016-10-30 DIAGNOSIS — R05 Cough: Secondary | ICD-10-CM

## 2016-10-30 DIAGNOSIS — R053 Chronic cough: Secondary | ICD-10-CM

## 2016-10-31 NOTE — Progress Notes (Signed)
Pre visit review using our clinic review tool, if applicable. No additional management support is needed unless otherwise documented below in the visit note.  Chief Complaint  Patient presents with  . Annual Exam    HPI: Patient  Jaime Orr  66 y.o. comes in today for Preventive Health Care visit  And fu cough  Slowly getting better    Cough up some ocass . Worse after eating.   Cut out etoh 8 in one month from illness and beginning to odd jobs and feeling better   One sericin dec portions and losing weight .  hsa reflux and taking ranitidine  Off of the nexium  Once a day ocass twice  Sees urology  On flomax  Doing ok     Health Maintenance  Topic Date Due  . Hepatitis C Screening  [birth year]  . PNEUMOCOCCAL POLYSACCHARIDE VACCINE (1) 06/27/1955  . FOOT EXAM  06/27/1963  . OPHTHALMOLOGY EXAM  06/27/1963  . ZOSTAVAX  06/26/2013  . TETANUS/TDAP  10/02/2016  . HEMOGLOBIN A1C  04/22/2017  . COLONOSCOPY  05/17/2026  . INFLUENZA VACCINE  Addressed  . HIV Screening  Completed   Health Maintenance Review LIFESTYLE:  Exercise:  activiy  Tobacco/ETS:n Alcohol: only 8 in past month  Sugar beverages: Sleep:ok 7-8? Drug use: no HH of 2 Work: see above   Still donates blood   ROS:  GEN/ HEENT: No fever, significant weight changes sweats headaches vision problems hearing changes, CV/ PULM; No chest pain shortness of breath cough, syncope,edema  change in exercise tolerance. GI /GU: No adominal pain, vomiting, change in bowel habits. No blood in the stool. No significant GU symptoms. SKIN/HEME: ,no acute skin rashes suspicious lesions or bleeding. No lymphadenopathy, nodules, masses.  NEURO/ PSYCH:  No neurologic signs such as weakness numbness. No depression anxiety. IMM/ Allergy: No unusual infections.  Allergy .   REST of 12 system review negative except as per HPI   Past Medical History:  Diagnosis Date  . Anxiety   . Cancer ([place])    skin cancer  . DEPRESSION  12/02/2008  . GERD 12/02/2008    no endo  . Heart murmur    born with  . 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 HYPERGLYCEMIA, MILD 01/19/2010  . HYPERTENSION 12/02/2008  . Hypertension   . IRON DEFICIENCY 07/18/2010   doantes blood  . Muscle fasciculation 11/14/2010  . Muscle twitch 11/14/2010   Left rm  Poss overuse  R/o metabolic    . OBESITY 08/09/2010  . SNORING 07/18/2010    Past Surgical History:  Procedure Laterality Date  . COLONOSCOPY    . KNEE ARTHROSCOPY WITH LATERAL MENISECTOMY Right 04/22/2015   Procedure: KNEE ARTHROSCOPY WITH LATERAL MENISECTOMY;  Surgeon: Kathryne Hitch, MD;  Location: [facility];  Service: Orthopedics;  Laterality: Right;  . KNEE ARTHROSCOPY WITH MEDIAL MENISECTOMY Right 04/22/2015   Procedure: RIGHT KNEE ARTHROSCOPY WITH  CHONDROPLASTY MEDIAL MENISECTOMY;  Surgeon: Kathryne Hitch, MD;  Location: [facility];  Service: Orthopedics;  Laterality: Right;  . KNEE CARTILAGE SURGERY  2009   Left Knee   . POLYPECTOMY    . SHOULDER SURGERY  1994   Left  . TONSILLECTOMY  1963  . TONSILLECTOMY      Family History  Problem Relation Age of Onset  . Stroke Mother     6  . Depression Mother   . Hypertension Father   . Heart disease Father  77  . Alcohol abuse Father   . Colon polyps Father   . Sleep apnea Sister   . Colon cancer Neg Hx   . Esophageal cancer Neg Hx   . Rectal cancer Neg Hx   . Stomach cancer Neg Hx     Social History   Social History  . Marital status: Married    Spouse name: N/A  . Number of children: N/A  . Years of education: N/A   Social History Main Topics  . Smoking status: Former Research scientist (life sciences)  . Smokeless tobacco: Never Used  . Alcohol use 8.4 oz/week    14 Cans of beer per week     Comment: daily 2-3 beers/day  . Drug use: No  . Sexual activity: Not Asked   Other Topics Concern  . 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   Originally from [place]  To [place] of 2 dogs and cats    Donates blood          Outpatient Medications Prior to Visit  Medication Sig Dispense Refill  . amLODipine (NORVASC) 10 MG tablet TAKE 1 TABLET DAILY 90 tablet 0  . benzonatate (TESSALON) 100 MG capsule Take 1 capsule (100 mg total) by mouth 3 (three) times daily as needed for cough. 30 capsule 0  . carvedilol (COREG) 25 MG tablet Take 1 tablet (25 mg total) by mouth 2 (two) times daily with a meal. 180 tablet 3  . esomeprazole (NEXIUM) 40 MG capsule TAKE 1 CAPSULE DAILY BEFOREBREAKFAST 90 capsule 0  . KLOR-CON M20 20 MEQ tablet TAKE 1 TABLET TWICE A DAY 180 tablet 0  . potassium chloride SA (KLOR-CON M20) 20 MEQ tablet Take 1 tablet (20 mEq total) by mouth 2 (two) times daily. 180 tablet 2  . tamsulosin (FLOMAX) 0.4 MG CAPS capsule Take 1 capsule daily    . valsartan-hydrochlorothiazide (DIOVAN-HCT) 320-25 MG tablet TAKE 1 TABLET DAILY 90 tablet 0  . escitalopram (LEXAPRO) 20 MG tablet Take 1 tablet (20 mg total) by mouth daily. 90 tablet 1   Facility-Administered Medications Prior to Visit  Medication Dose Route Frequency Provider Last Rate Last Dose  . fentaNYL (SUBLIMAZE) injection 25-50 mcg  25-50 mcg Intravenous Q5 min PRN Myrtie Soman, MD      . promethazine (PHENERGAN) injection 6.25-12.5 mg  6.25-12.5 mg Intravenous Q15 min PRN Myrtie Soman, MD         EXAM:  BP 134/72 (BP Location: Right Arm, Patient Position: Sitting, Cuff Size: Large)   Temp 98.3 F (36.8 C) (Oral)   Ht 5\' 8"  (1.727 m)   Wt 223 lb (101.2 kg)   BMI 33.91 kg/m   Body mass index is 33.91 kg/m. Wt Readings from Last 3 Encounters:  11/01/16 223 lb (101.2 kg)  10/23/16 224 lb (101.6 kg)  07/27/16 229 lb 3.2 oz (104 kg)    Physical Exam: Vital signs reviewed [id number] is a well-developed  well-nourished alert cooperative    who appearsr stated age in no acute distress.  HEENT: normocephalic atraumatic , Eyes: PERRL EOM's full, conjunctiva clear, Nares: paten,t no deformity discharge or tenderness., Ears: no deformity EAC's clear TMs with normal landmarks. Mouth: clear OP, no lesions, edema.  Moist mucous membranes. Dentition in adequate repair. NECK: supple without masses, thyromegaly or bruits. CHEST/PULM:  Clear to auscultation and percussion breath sounds equal no wheeze , rales or rhonchi. No chest wall deformities or tenderness. Well-healed left shoulder surgical scar. Breast: normal by inspection . No dimpling, discharge, masses, tenderness or discharge . CV: PMI is nondisplaced, S1 S2 no gallops, murmurs, rubs. Peripheral pulses are full without delay.No JVD .  ABDOMEN: Bowel sounds normal nontender  No guard or rebound, no hepato splenomegal no CVA tenderness.  No hernia. Extremtities:  No clubbing cyanosis or edema, no acute joint swelling or redness no focal atrophy NEURO:  Oriented x3, cranial nerves 3-12 appear to be intact, no obvious focal weakness,gait within normal limits no abnormal reflexes or asymmetrical SKIN: No acute rashes normal turgor, color, no bruising or petechiae. PSYCH: Oriented, good eye contact, no obvious depression anxiety, cognition and judgment appear normal. LN: no cervical axillary inguinal adenopathy  Lab Results  Component Value Date   WBC 4.8 10/23/2016   HGB 13.4 10/23/2016   HCT 39.6 10/23/2016   PLT 251.0 10/23/2016   GLUCOSE 97 10/23/2016   CHOL 186 10/23/2016   TRIG 140.0 10/23/2016   HDL 34.20 (L) 10/23/2016   LDLCALC 124 (H) 10/23/2016   ALT 25 10/23/2016   AST 16 10/23/2016   NA 143 10/23/2016   K 3.9 10/23/2016   CL 105 10/23/2016   CREATININE 0.95 10/23/2016   BUN 19 10/23/2016   CO2 32 10/23/2016   TSH 1.85 10/23/2016   PSA 1.87 10/23/2016   HGBA1C 5.9 10/23/2016   MICROALBUR 1.7 07/08/2010    BP Readings from  Last 3 Encounters:  11/01/16 134/72  10/23/16 (!) 158/86  07/27/16 (!) 142/70   ekg sinus brady 48 nl intervals no acute findings  Lab results reviewed with patient   ASSESSMENT AND PLAN:  Discussed the following assessment and plan:  Visit for preventive health examination  Cough, persistent  Essential hypertension - Improved today if he continues to lose weight we can back off on medicine. - Plan: EKG 12-Lead  Medication management - no change today if pulse too low andsx can dec the coreg  Hyperglycemia - better  BMI 33.0-33.9,adult  Screening for cardiovascular condition - Plan: EKG 12-Lead  Low serum HDL - cont wegiht loss etc  Reviewed above overall doing much better. Encouraged to continue with minimal alcohol increase activity decreased extra portions and sugars. I believe his blood pressure and metabolic issues will be controlled as well as his reflux this way. I do agree with him getting off the PPI possible. Suspect the cough will improve with time also although it is interesting his wife notes it's worse after he eats which may be a reflux trigger. Patient Care Team: Burnis Medin, MD as PCP - General Ninetta Lights, MD (Orthopedic Surgery) Inda Castle, MD (Gastroenterology) [facility] dermatology Patient Instructions   Continue lifestyle intervention healthy eating and exercise . Get an eye check   Weight loss will help BP energy and reflux  And cholesterol  Wt Readings from Last 3 Encounters:  11/01/16 223 lb (101.2 kg)  10/23/16 224 lb (101.6 kg)  07/27/16 229 lb 3.2 oz (104 kg)    Healthy lifestyle includes : At least 150 minutes of exercise weeks  , weight at healthy levels, which is usually   BMI 19-25. Avoid trans fats and processed foods;  Increase fresh fruits and veges to 5 servings per day. And avoid sweet  beverages including tea and juice. Mediterranean diet with olive oil and nuts have been noted to be heart and brain healthy . Avoid  tobacco products . Limit  alcohol to  7 per week for women and 14 servings for men.  Get adequate sleep . Wear seat belts . Don't text and drive .   ROV in 6 months or as needed  I agree taking ranitidine 150 twice a day until cough is better and then   Once or twice a day .     Standley Brooking. Panosh M.D.

## 2016-11-01 ENCOUNTER — Encounter: Payer: Self-pay | Admitting: Internal Medicine

## 2016-11-01 ENCOUNTER — Ambulatory Visit (INDEPENDENT_AMBULATORY_CARE_PROVIDER_SITE_OTHER): Payer: Federal, State, Local not specified - PPO | Admitting: Internal Medicine

## 2016-11-01 VITALS — BP 134/72 | Temp 98.3°F | Ht 68.0 in | Wt 223.0 lb

## 2016-11-01 DIAGNOSIS — R05 Cough: Secondary | ICD-10-CM

## 2016-11-01 DIAGNOSIS — Z0000 Encounter for general adult medical examination without abnormal findings: Secondary | ICD-10-CM

## 2016-11-01 DIAGNOSIS — Z6833 Body mass index (BMI) 33.0-33.9, adult: Secondary | ICD-10-CM | POA: Diagnosis not present

## 2016-11-01 DIAGNOSIS — Z79899 Other long term (current) drug therapy: Secondary | ICD-10-CM | POA: Diagnosis not present

## 2016-11-01 DIAGNOSIS — R739 Hyperglycemia, unspecified: Secondary | ICD-10-CM | POA: Diagnosis not present

## 2016-11-01 DIAGNOSIS — I10 Essential (primary) hypertension: Secondary | ICD-10-CM | POA: Diagnosis not present

## 2016-11-01 DIAGNOSIS — R748 Abnormal levels of other serum enzymes: Secondary | ICD-10-CM

## 2016-11-01 DIAGNOSIS — Z136 Encounter for screening for cardiovascular disorders: Secondary | ICD-10-CM | POA: Diagnosis not present

## 2016-11-01 DIAGNOSIS — R053 Chronic cough: Secondary | ICD-10-CM

## 2016-11-01 MED ORDER — ESCITALOPRAM OXALATE 20 MG PO TABS: 20.0000 mg | ORAL_TABLET | Freq: Every day | ORAL | 1 refills | Status: DC

## 2016-11-01 NOTE — Patient Instructions (Addendum)
Continue lifestyle intervention healthy eating and exercise . Get an eye check   Weight loss will help BP energy and reflux  And cholesterol  Wt Readings from Last 3 Encounters:  11/01/16 223 lb (101.2 kg)  10/23/16 224 lb (101.6 kg)  07/27/16 229 lb 3.2 oz (104 kg)    Healthy lifestyle includes : At least 150 minutes of exercise weeks  , weight at healthy levels, which is usually   BMI 19-25. Avoid trans fats and processed foods;  Increase fresh fruits and veges to 5 servings per day. And avoid sweet beverages including tea and juice. Mediterranean diet with olive oil and nuts have been noted to be heart and brain healthy . Avoid tobacco products . Limit  alcohol to  7 per week for women and 14 servings for men.  Get adequate sleep . Wear seat belts . Don't text and drive .   ROV in 6 months or as needed  I agree taking ranitidine 150 twice a day until cough is better and then   Once or twice a day .

## 2016-11-01 NOTE — Telephone Encounter (Signed)
Duplicate request.  Sent in today during office visit. Message sent to the pharmacy.

## 2016-11-02 ENCOUNTER — Other Ambulatory Visit: Payer: Self-pay | Admitting: Internal Medicine

## 2016-12-27 ENCOUNTER — Other Ambulatory Visit: Payer: Self-pay | Admitting: Emergency Medicine

## 2016-12-27 MED ORDER — POTASSIUM CHLORIDE CRYS ER 20 MEQ PO TBCR: 20.0000 meq | EXTENDED_RELEASE_TABLET | Freq: Two times a day (BID) | ORAL | 2 refills | Status: DC

## 2016-12-27 MED ORDER — TAMSULOSIN HCL 0.4 MG PO CAPS: 0.4000 mg | ORAL_CAPSULE | Freq: Every day | ORAL | 0 refills | Status: DC

## 2016-12-27 MED ORDER — CARVEDILOL 25 MG PO TABS: 25.0000 mg | ORAL_TABLET | Freq: Two times a day (BID) | ORAL | 0 refills | Status: DC

## 2016-12-27 MED ORDER — VALSARTAN-HYDROCHLOROTHIAZIDE 320-25 MG PO TABS: 1.0000 | ORAL_TABLET | Freq: Every day | ORAL | 0 refills | Status: DC

## 2016-12-27 MED ORDER — AMLODIPINE BESYLATE 10 MG PO TABS: 10.0000 mg | ORAL_TABLET | Freq: Every day | ORAL | 0 refills | Status: DC

## 2016-12-27 NOTE — Telephone Encounter (Signed)
Pt would like a refill for Escitalopram 20 mg. Medication was already refilled 11/01/2016. Please advise.

## 2016-12-27 NOTE — Telephone Encounter (Signed)
Ok to refill meds  Ok refill lexapro for 6 months

## 2017-02-22 IMAGING — DX DG CHEST 2V
2 series · 2 of 2 positions shown · non-contrast
Comparison: 08/31/2011

CLINICAL DATA: Persistent productive cough.

EXAM:
CHEST  2 VIEW

[chest pa]
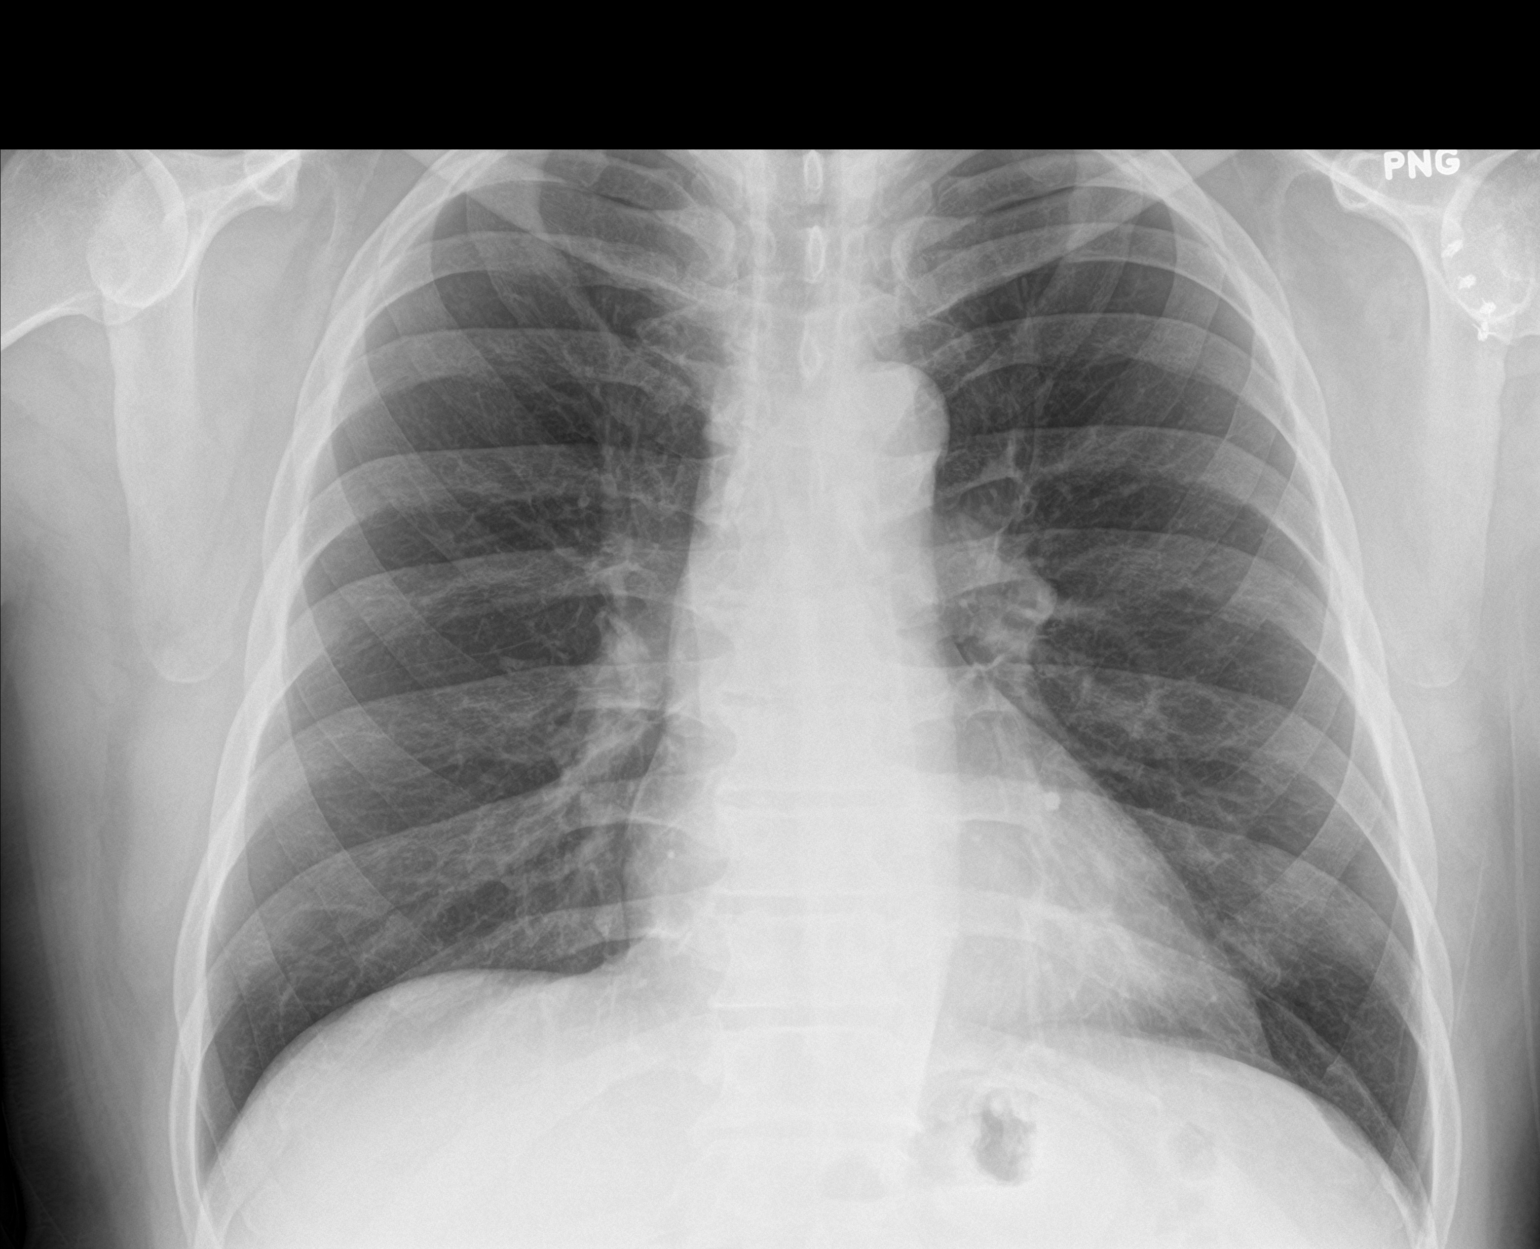

[chest lat]
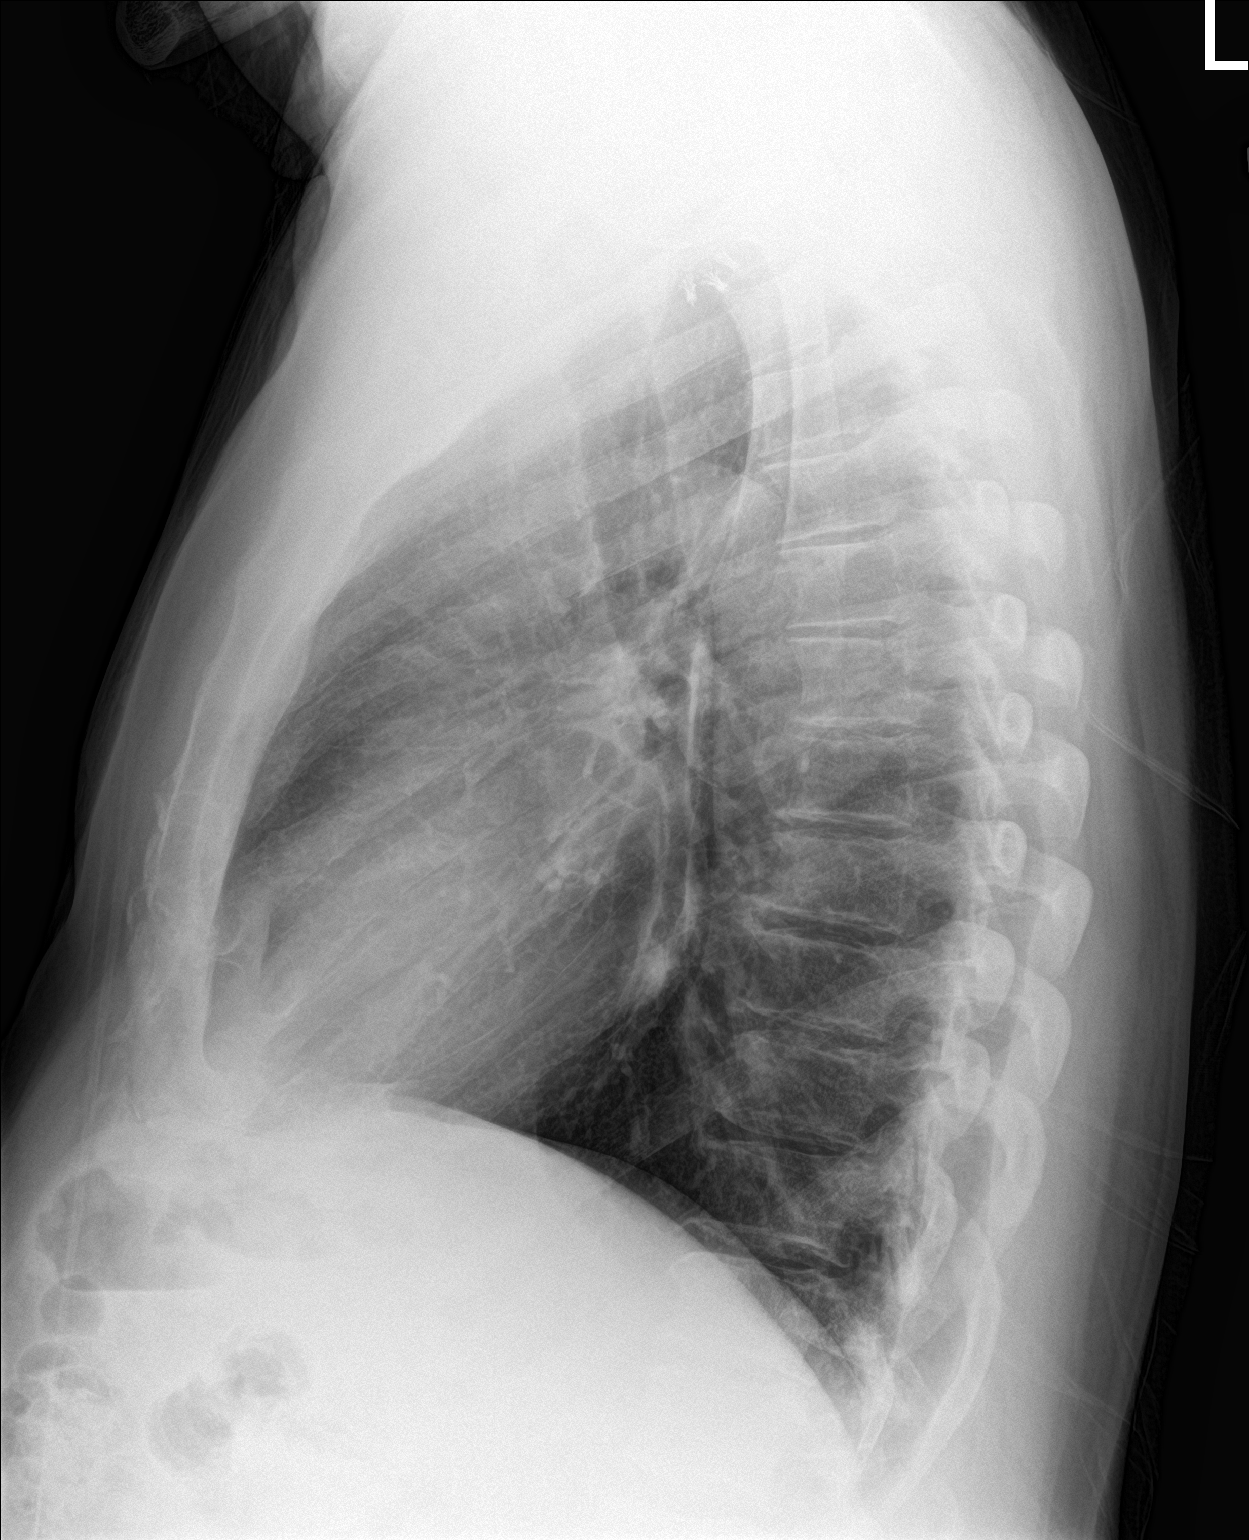

[2 of 2 positions shown; findings below may reference images not displayed]

FINDINGS: The heart size and mediastinal contours are within normal limits.
Both lungs are clear except for slight peribronchial thickening. The
visualized skeletal structures are unremarkable. Prior left shoulder
surgery.
IMPRESSION: Slight bronchitic changes.

## 2017-03-08 ENCOUNTER — Encounter: Payer: Self-pay | Admitting: Internal Medicine

## 2017-03-13 MED ORDER — TADALAFIL 5 MG PO TABS: ORAL_TABLET | ORAL | 1 refills | Status: DC

## 2017-03-13 NOTE — Telephone Encounter (Signed)
done

## 2017-03-14 ENCOUNTER — Telehealth: Payer: Self-pay | Admitting: Emergency Medicine

## 2017-03-14 ENCOUNTER — Other Ambulatory Visit: Payer: Self-pay | Admitting: Emergency Medicine

## 2017-03-14 MED ORDER — SILDENAFIL CITRATE 100 MG PO TABS: 100.0000 mg | ORAL_TABLET | Freq: Every day | ORAL | 3 refills | Status: DC | PRN

## 2017-03-14 NOTE — Telephone Encounter (Signed)
Spoke with the pharmacist and the payment for the tadalafil is 600.00 dollars. Pt would like to switch to the generic for Viagra.

## 2017-03-14 NOTE — Telephone Encounter (Signed)
Medication has been sent in 

## 2017-03-14 NOTE — Telephone Encounter (Signed)
Ok to send in generic viagra  100 mg  If available   Take 1 po  As needed disp 10 refill x 3

## 2017-03-20 ENCOUNTER — Telehealth: Payer: Self-pay | Admitting: Family Medicine

## 2017-03-20 NOTE — Telephone Encounter (Signed)
Received a fax from the pharmacy.  The refill of Cialis will cost the pt over $600.  He would like to switch to Viagra.  Please advise.  Thanks!!!

## 2017-03-21 NOTE — Telephone Encounter (Signed)
Left a VM for patient to give the office a call back.  

## 2017-03-22 NOTE — Telephone Encounter (Signed)
Left a VM for pt to give the office call back

## 2017-03-26 ENCOUNTER — Encounter: Payer: Self-pay | Admitting: Internal Medicine

## 2017-03-26 ENCOUNTER — Telehealth: Payer: Self-pay | Admitting: Emergency Medicine

## 2017-03-26 NOTE — Telephone Encounter (Signed)
Spoke with patient about prescription for Cialis and viagra prices. Suggested to patient to call [facility] and see if they can help him with medication price. Advised him to give the office a call or send a my chart message and let us know what he would like to do.

## 2017-03-27 ENCOUNTER — Other Ambulatory Visit: Payer: Self-pay | Admitting: Emergency Medicine

## 2017-03-27 MED ORDER — SILDENAFIL CITRATE 20 MG PO TABS: 20.0000 mg | ORAL_TABLET | Freq: Three times a day (TID) | ORAL | 0 refills | Status: DC

## 2017-03-30 ENCOUNTER — Other Ambulatory Visit: Payer: Self-pay | Admitting: Internal Medicine

## 2017-04-09 NOTE — Telephone Encounter (Signed)
Viagra sent in on 03/14/17

## 2017-04-17 ENCOUNTER — Ambulatory Visit (INDEPENDENT_AMBULATORY_CARE_PROVIDER_SITE_OTHER): Payer: Federal, State, Local not specified - PPO | Admitting: Internal Medicine

## 2017-04-17 ENCOUNTER — Encounter: Payer: Self-pay | Admitting: Internal Medicine

## 2017-04-17 VITALS — BP 124/70 | HR 60 | Temp 98.6°F | Wt 229.4 lb

## 2017-04-17 DIAGNOSIS — R21 Rash and other nonspecific skin eruption: Secondary | ICD-10-CM

## 2017-04-17 DIAGNOSIS — B354 Tinea corporis: Secondary | ICD-10-CM

## 2017-04-17 MED ORDER — KETOCONAZOLE 2 % EX CREA: 1.0000 "application " | TOPICAL_CREAM | Freq: Two times a day (BID) | CUTANEOUS | 1 refills | Status: DC

## 2017-04-17 MED ORDER — TRIAMCINOLONE ACETONIDE 0.1 % EX CREA: 1.0000 "application " | TOPICAL_CREAM | Freq: Two times a day (BID) | CUTANEOUS | 0 refills | Status: DC

## 2017-04-17 NOTE — Progress Notes (Signed)
Chief Complaint  Patient presents with  . Rash    located on the back of the neck     HPI: Jaime Orr 66 y.o.  sda   Onset weeks    And   used  Triple antibiotic ointment  And hcs 2.5 %      No help and then did bleach x 2 days in a row  .  Not better   Very itchy .      Antibacterial ointment .    And 2.5 hcs .   recently  Dog sitting      No exposure  Cats had patches months ago  No other  No other patches hx of skin conditions like this.  ROS: See pertinent positives and negatives per HPI.  Past Medical History:  Diagnosis Date  . Anxiety   . Cancer ([place])    skin cancer  . DEPRESSION 12/02/2008  . GERD 12/02/2008    no endo  . Heart murmur    born with  . 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 HYPERGLYCEMIA, MILD 01/19/2010  . HYPERTENSION 12/02/2008  . Hypertension   . IRON DEFICIENCY 07/18/2010   doantes blood  . Muscle fasciculation 11/14/2010  . Muscle twitch 11/14/2010   Left rm  Poss overuse  R/o metabolic    . OBESITY 08/09/2010  . SNORING 07/18/2010    Family History  Problem Relation Age of Onset  . Stroke Mother        15  . Depression Mother   . Hypertension Father   . Heart disease Father        31  . Alcohol abuse Father   . Colon polyps Father   . Sleep apnea Sister   . Colon cancer Neg Hx   . Esophageal cancer Neg Hx   . Rectal cancer Neg Hx   . Stomach cancer Neg Hx     Social History   Social History  . Marital status: Married    Spouse name: N/A  . Number of children: N/A  . Years of education: N/A   Social History Main Topics  . Smoking status: Former Research scientist (life sciences)  . Smokeless tobacco: Never Used  . Alcohol use 8.4 oz/week    14 Cans of beer per week     Comment: daily 2-3 beers/day  . Drug use: No  . Sexual activity: Not Asked   Other Topics Concern  . 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   Originally from [place]  To [place] of 2 dogs and cats    Donates blood          Outpatient Medications Prior to Visit  Medication Sig Dispense Refill  . amLODipine (NORVASC) 10 MG tablet TAKE 1 TABLET (10 MG TOTAL) BY MOUTH DAILY. 90 tablet 0  . carvedilol (COREG) 25 MG tablet TAKE 1 TABLET (25 MG TOTAL) BY MOUTH 2 (TWO) TIMES DAILY WITH A MEAL. 180 tablet 0  . escitalopram (LEXAPRO) 20 MG tablet Take 1 tablet (20 mg total) by mouth daily. 90 tablet 1  . esomeprazole (NEXIUM) 40 MG capsule TAKE 1 CAPSULE DAILY BEFOREBREAKFAST 90 capsule 0  .  KLOR-CON M20 20 MEQ tablet TAKE 1 TABLET TWICE A DAY 180 tablet 0  . potassium chloride SA (KLOR-CON M20) 20 MEQ tablet Take 1 tablet (20 mEq total) by mouth 2 (two) times daily. 180 tablet 2  . sildenafil (REVATIO) 20 MG tablet Take 1 tablet (20 mg total) by mouth 3 (three) times daily. 90 tablet 0  . tamsulosin (FLOMAX) 0.4 MG CAPS capsule Take 1 capsule (0.4 mg total) by mouth daily. Take 1 capsule daily 30 capsule 0  . valsartan-hydrochlorothiazide (DIOVAN-HCT) 320-25 MG tablet TAKE 1 TABLET BY MOUTH EVERY DAY 90 tablet 0  . benzonatate (TESSALON) 100 MG capsule TAKE 1 CAPSULE (100 MG TOTAL) BY MOUTH 3 (THREE) TIMES DAILY AS NEEDED FOR COUGH. 30 capsule 0  . sildenafil (VIAGRA) 100 MG tablet Take 1 tablet (100 mg total) by mouth daily as needed for erectile dysfunction. 10 tablet 3  . tadalafil (CIALIS) 5 MG tablet TAKE 1 TABLET DAILY FOR    BENIGN PROSTATIC           HYPERTROPHY 90 tablet 1   Facility-Administered Medications Prior to Visit  Medication Dose Route Frequency Provider Last Rate Last Dose  . fentaNYL (SUBLIMAZE) injection 25-50 mcg  25-50 mcg Intravenous Q5 min PRN Myrtie Soman, MD      . promethazine (PHENERGAN) injection 6.25-12.5 mg  6.25-12.5 mg Intravenous Q15 min PRN Myrtie Soman, MD         EXAM:  BP 124/70 (BP Location: Right  Arm, Patient Position: Sitting, Cuff Size: Normal)   Pulse 60   Temp 98.6 F (37 C) (Oral)   Wt 229 lb 6.4 oz (104.1 kg)   BMI 34.88 kg/m   Body mass index is 34.88 kg/m.  GENERAL: vitals reviewed and listed above, alert, oriented, appears well hydrated and in no acute distress HEENT: atraumatic, conjunctiva  clear, no obvious abnormalities on inspection of external nose and ears  Skin abck of neck to lower scalp  with large  8 cm discrete border scaly with  Lichenified center from scratching  without vesicle plaque or dc pustules.  No hair loss   PSYCH: pleasant and cooperative, no obvious depression or anxiety  ASSESSMENT AND PLAN:  Discussed the following assessment and plan:  Rash  Tinea corporis ? - vs eczema like rash  Do not use bleach can stop the triple antibiotic ointment. We use antifungal topical and stronger hydrocortisone to decrease the itching. This could be an unusual eczema but has never had this. Expectant management improvement within 2 weeks if not plan contact for fu plan.  -Patient advised to return or notify health care team  if symptoms worsen ,persist or new concerns arise.  Patient Instructions  Treating for   fungus and  Stronger antiitching cortisone  Med .      Stop using the triple antibiotic ointment this is not a bacterial infection If not improving in the next 2 weeks contact us for advice. I'm not sure it is a skin fungus but is suspicious for such and there are obviously or signs of constant scratching. Don't use bleach on your skin  it decreases your skin immunity and healing.          Standley Brooking. Winna Golla M.D.

## 2017-04-17 NOTE — Patient Instructions (Addendum)
Treating for   fungus and  Stronger antiitching cortisone  Med .      Stop using the triple antibiotic ointment this is not a bacterial infection If not improving in the next 2 weeks contact us for advice. I'm not sure it is a skin fungus but is suspicious for such and there are obviously or signs of constant scratching. Don't use bleach on your skin  it decreases your skin immunity and healing.

## 2017-06-18 ENCOUNTER — Encounter: Payer: Self-pay | Admitting: Internal Medicine

## 2017-06-18 ENCOUNTER — Other Ambulatory Visit: Payer: Self-pay | Admitting: Internal Medicine

## 2017-06-22 ENCOUNTER — Encounter: Payer: Self-pay | Admitting: Internal Medicine

## 2017-06-26 ENCOUNTER — Encounter: Payer: Self-pay | Admitting: Internal Medicine

## 2017-06-26 MED ORDER — VALSARTAN-HYDROCHLOROTHIAZIDE 320-25 MG PO TABS: 1.0000 | ORAL_TABLET | Freq: Every day | ORAL | 0 refills | Status: DC

## 2017-06-26 MED ORDER — AMLODIPINE BESYLATE 10 MG PO TABS: 10.0000 mg | ORAL_TABLET | Freq: Every day | ORAL | 0 refills | Status: DC

## 2017-06-26 MED ORDER — ESCITALOPRAM OXALATE 20 MG PO TABS: 20.0000 mg | ORAL_TABLET | Freq: Every day | ORAL | 0 refills | Status: DC

## 2017-06-28 ENCOUNTER — Other Ambulatory Visit: Payer: Self-pay | Admitting: Emergency Medicine

## 2017-06-28 MED ORDER — CARVEDILOL 25 MG PO TABS: 25.0000 mg | ORAL_TABLET | Freq: Two times a day (BID) | ORAL | 0 refills | Status: DC

## 2017-06-29 ENCOUNTER — Other Ambulatory Visit: Payer: Self-pay | Admitting: Emergency Medicine

## 2017-06-29 MED ORDER — ESCITALOPRAM OXALATE 20 MG PO TABS: 20.0000 mg | ORAL_TABLET | Freq: Every day | ORAL | 0 refills | Status: DC

## 2017-06-29 MED ORDER — VALSARTAN-HYDROCHLOROTHIAZIDE 320-25 MG PO TABS: 1.0000 | ORAL_TABLET | Freq: Every day | ORAL | 0 refills | Status: DC

## 2017-06-29 MED ORDER — AMLODIPINE BESYLATE 10 MG PO TABS: 10.0000 mg | ORAL_TABLET | Freq: Every day | ORAL | 0 refills | Status: DC

## 2017-09-23 ENCOUNTER — Other Ambulatory Visit: Payer: Self-pay | Admitting: Internal Medicine

## 2017-09-24 ENCOUNTER — Telehealth: Payer: Self-pay | Admitting: Emergency Medicine

## 2017-09-24 NOTE — Telephone Encounter (Signed)
Left a VM for patient to give the office a call back to schedule a f/u appointment for further refills.

## 2017-10-01 ENCOUNTER — Other Ambulatory Visit: Payer: Self-pay | Admitting: Internal Medicine

## 2017-10-01 NOTE — Telephone Encounter (Signed)
Copied from [place] [id number]. Topic: Quick Communication - Rx Refill/Question >> Oct 01, 2017 10:43 AM Robina Ade, Helene Kelp D wrote: Has the patient contacted their pharmacy? Yes (Agent: If no, request that the patient contact the pharmacy for the refill.) Preferred Pharmacy (with phone number or street name): [id number] - [place], [place] - [address] Agent: Please be advised that RX refills may take up to 3 business days. We ask that you follow-up with your pharmacy. Patient needs refill on his amLODipine (NORVASC) 10 MG tablet, potassium chloride SA (KLOR-CON M20) 20 MEQ tablet and valsartan-hydrochlorothiazide (DIOVAN-HCT) 320-25 MG tablet. Patient needs refill to last him until his CPE in Feb. For a 90 day supply.

## 2017-10-03 ENCOUNTER — Other Ambulatory Visit: Payer: Self-pay

## 2017-10-03 ENCOUNTER — Other Ambulatory Visit: Payer: Self-pay | Admitting: Internal Medicine

## 2017-10-03 MED ORDER — POTASSIUM CHLORIDE CRYS ER 20 MEQ PO TBCR: 20.0000 meq | EXTENDED_RELEASE_TABLET | Freq: Two times a day (BID) | ORAL | 0 refills | Status: DC

## 2017-10-03 MED ORDER — VALSARTAN-HYDROCHLOROTHIAZIDE 320-25 MG PO TABS: 1.0000 | ORAL_TABLET | Freq: Every day | ORAL | 0 refills | Status: DC

## 2017-10-03 MED ORDER — POTASSIUM CHLORIDE CRYS ER 20 MEQ PO TBCR: 20.0000 meq | EXTENDED_RELEASE_TABLET | Freq: Two times a day (BID) | ORAL | 2 refills | Status: DC

## 2017-10-08 ENCOUNTER — Other Ambulatory Visit: Payer: Self-pay | Admitting: Internal Medicine

## 2017-10-10 ENCOUNTER — Other Ambulatory Visit: Payer: Self-pay | Admitting: Internal Medicine

## 2017-10-10 NOTE — Telephone Encounter (Signed)
Copied from [place] [phone] [phone]. Topic: Quick Communication - See Telephone Encounter >> Oct 10, 2017 10:22 AM Ahmed Prima L wrote: CRM for notification. See Telephone encounter for:   10/10/17.  [id number] - , [address] called and they had a request for tamsulosin (FLOMAX) 0.4 MG CAPS capsule for a 90 day supply. They want to know if he is actually suppose to still be taking it because it has not been filled since march of 2018. Please advise with pharmacy [phone], call back is [phone]

## 2017-10-12 MED ORDER — TAMSULOSIN HCL 0.4 MG PO CAPS: 0.4000 mg | ORAL_CAPSULE | Freq: Every day | ORAL | 0 refills | Status: DC

## 2017-10-12 NOTE — Telephone Encounter (Signed)
Pt has not been taking Flomax consistently x 1 year.  [facility] states that the last time this was filled there was Sep 13, 2017 Rx last filled March 2018 and was just picked up 09/13/17  Do you want to refill at this time? Please advise Dr Regis Bill, thanks.

## 2017-10-12 NOTE — Telephone Encounter (Signed)
Spoke with pharmacy, patient was requesting this Rx Aware okay to refill, rx sent.  They will let patient know as well that if Urologist is still managing this medication, refills need to come from them. Nothing further needed.

## 2017-10-12 NOTE — Telephone Encounter (Signed)
Ok to refill x 1   If patient    Not pharmacy request Has upcoming appt   ( I  beleive was originally ordered  by urology)

## 2017-10-30 ENCOUNTER — Other Ambulatory Visit: Payer: Self-pay | Admitting: Internal Medicine

## 2017-10-30 MED ORDER — ESCITALOPRAM OXALATE 20 MG PO TABS: 20.0000 mg | ORAL_TABLET | Freq: Every day | ORAL | 0 refills | Status: DC

## 2017-11-09 NOTE — Progress Notes (Signed)
Chief Complaint  Patient presents with  . Annual Exam  . Hypertension  . Medication Management    HPI: Patient  Jaime Orr  67 y.o. comes in today for Preventive Health Care visit  And Chronic disease management  Dealing with foot and cortisone injec and meloxicam helps some   bp no change med not checking   etoh 3 per day  Less exercise recently   Taking med no cv sx    Taking nexium [employer]  Doing ok   On Flomax seems to help per urology   Mood  Has dec to lexapro 10 and seems ok   Right thorax pain at times? Muscle     Health Maintenance  Topic Date Due  . Hepatitis C Screening  1951-07-21  . FOOT EXAM  06/27/1963  . OPHTHALMOLOGY EXAM  06/27/1963  . TETANUS/TDAP  10/02/2016  . HEMOGLOBIN A1C  04/22/2017  . INFLUENZA VACCINE  05/02/2017  . COLONOSCOPY  05/17/2026  . HIV Screening  Completed   Health Maintenance Review LIFESTYLE:  Exercise:   No reg exercise  Tobacco/ETS: last week  Smoked once  Alcohol:   2-3 per day  Sugar beverages: no Sleep:  A lot   ? If good . Rested   10  Snores.  Drug use: no HH of   2 dog  Work: part time jobs   Week    dpown to 10 mg   [employer] and [employer] .  Event staffing.  ROS:  GEN/ HEENT: No fever, significant weight changes sweats headaches vision problems hearing changes, CV/ PULM; No chest pain shortness of breath cough, syncope,edema  change in exercise tolerance. GI /GU: No adominal pain, vomiting, change in bowel habits. No blood in the stool. No significant GU symptoms. SKIN/HEME: ,no acute skin rashes suspicious lesions or bleeding. No lymphadenopathy, nodules, masses.  NEURO/ PSYCH:  No neurologic signs such as weakness numbness. No depression anxiety. IMM/ Allergy: No unusual infections.  Allergy .   REST of 12 system review negative except as per HPI   Past Medical History:  Diagnosis Date  . Anxiety   . Cancer ([place])    skin cancer  . DEPRESSION 12/02/2008  . GERD 12/02/2008    no endo  . Heart  murmur    born with  . 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 HYPERGLYCEMIA, MILD 01/19/2010  . HYPERTENSION 12/02/2008  . Hypertension   . IRON DEFICIENCY 07/18/2010   doantes blood  . Muscle fasciculation 11/14/2010  . Muscle twitch 11/14/2010   Left rm  Poss overuse  R/o metabolic    . OBESITY 08/09/2010  . SNORING 07/18/2010    Past Surgical History:  Procedure Laterality Date  . COLONOSCOPY    . KNEE ARTHROSCOPY WITH LATERAL MENISECTOMY Right 04/22/2015   Procedure: KNEE ARTHROSCOPY WITH LATERAL MENISECTOMY;  Surgeon: Kathryne Hitch, MD;  Location: [facility];  Service: Orthopedics;  Laterality: Right;  . KNEE ARTHROSCOPY WITH MEDIAL MENISECTOMY Right 04/22/2015   Procedure: RIGHT KNEE ARTHROSCOPY WITH  CHONDROPLASTY MEDIAL MENISECTOMY;  Surgeon: Kathryne Hitch, MD;  Location: [facility];  Service: Orthopedics;  Laterality: Right;  . KNEE CARTILAGE SURGERY  2009   Left Knee   . POLYPECTOMY    . SHOULDER SURGERY  1994   Left  . TONSILLECTOMY  1963  . TONSILLECTOMY      Family History  Problem Relation Age of Onset  . Stroke Mother  18  . Depression Mother   . Hypertension Father   . Heart disease Father        43  . Alcohol abuse Father   . Colon polyps Father   . Sleep apnea Sister   . Colon cancer Neg Hx   . Esophageal cancer Neg Hx   . Rectal cancer Neg Hx   . Stomach cancer Neg Hx     Social History   Socioeconomic History  . Marital status: Married    Spouse name: None  . Number of children: None  . Years of education: None  . Highest education level: None  Social Needs  . Financial resource strain: None  . Food insecurity - worry: None  . Food insecurity - inability: None  . Transportation needs - medical: None  . Transportation needs - non-medical: None  Occupational History  . None  Tobacco Use  . Smoking status: Former Research scientist (life sciences)  . Smokeless tobacco: Never Used  Substance and Sexual Activity  . Alcohol  use: Yes    Alcohol/week: 8.4 oz    Types: 14 Cans of beer per week    Comment: daily 2-3 beers/day  . Drug use: No  . Sexual activity: None  Other Topics Concern  . 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   Originally from [place]  To [place] of 2 dogs and cats    Donates blood       Outpatient Medications Prior to Visit  Medication Sig Dispense Refill  . amLODipine (NORVASC) 10 MG tablet TAKE 1 TABLET BY MOUTH EVERY DAY 90 tablet 0  . carvedilol (COREG) 25 MG tablet TAKE 1 TABLET (25 MG TOTAL) BY MOUTH 2 (TWO) TIMES DAILY WITH A MEAL. 180 tablet 1  . escitalopram (LEXAPRO) 20 MG tablet Take 1 tablet (20 mg total) by mouth daily. (Patient taking differently: Take 10 mg by mouth daily. ) 90 tablet 0  . esomeprazole (NEXIUM) 20 MG capsule Take 20 mg by mouth daily at 12 noon.    Marland Kitchen ketoconazole (NIZORAL) 2 % cream Apply 1 application topically 2 (two) times daily. 30 g 1  . meloxicam (MOBIC) 15 MG tablet Take 15 mg by mouth daily.    . potassium chloride SA (KLOR-CON M20) 20 MEQ tablet Take 1 tablet (20 mEq total) by mouth 2 (two) times daily. 180 tablet 0  . sildenafil (REVATIO) 20 MG tablet Take 1 tablet (20 mg total) by mouth 3 (three) times daily. 90 tablet 0  . tamsulosin (FLOMAX) 0.4 MG CAPS capsule Take 1 capsule (0.4 mg total) by mouth daily. Take 1 capsule daily ( Please contact Urology for further refills if still managing) 30 capsule 0  . triamcinolone cream (KENALOG) 0.1 % Apply 1 application topically 2 (two) times daily. As needed for itching 30 g 0  . valsartan-hydrochlorothiazide (DIOVAN-HCT) 320-25 MG tablet Take 1 tablet by mouth daily. 90 tablet 0  . esomeprazole (NEXIUM) 40 MG capsule TAKE 1 CAPSULE DAILY BEFOREBREAKFAST (Patient not taking: Reported on  11/12/2017) 90 capsule 0  . potassium chloride SA (KLOR-CON M20) 20 MEQ tablet Take 1 tablet (20 mEq total) by mouth 2 (two) times daily. (Patient not taking: Reported on 11/12/2017) 180 tablet 2  Facility-Administered Medications Prior to Visit  Medication Dose Route Frequency Provider Last Rate Last Dose  . fentaNYL (SUBLIMAZE) injection 25-50 mcg  25-50 mcg Intravenous Q5 min PRN Myrtie Soman, MD      . promethazine (PHENERGAN) injection 6.25-12.5 mg  6.25-12.5 mg Intravenous Q15 min PRN Myrtie Soman, MD         EXAM:  BP 132/90 (BP Location: Right Arm, Patient Position: Sitting, Cuff Size: Normal)   Pulse 62   Temp 98.5 F (36.9 C) (Oral)   Ht 5' 7.5" (1.715 m)   Wt 227 lb (103 kg)   BMI 35.03 kg/m   Body mass index is 35.03 kg/m. Wt Readings from Last 3 Encounters:  11/12/17 227 lb (103 kg)  04/17/17 229 lb 6.4 oz (104.1 kg)  11/01/16 223 lb (101.2 kg)    Physical Exam: Vital signs reviewed NWG:NFAO is a well-developed well-nourished alert cooperative    who appearsr stated age in no acute distress.  HEENT: normocephalic atraumatic , Eyes: PERRL EOM's full, conjunctiva clear, Nares: paten,t no deformity discharge or tenderness., Ears: no deformity EAC's clear TMs with normal landmarks. Mouth: clear OP, no lesions, edema.  Moist mucous membranes. Dentition in adequate repair. NECK: supple without masses, thyromegaly or bruits. CHEST/PULM:  Clear to auscultation and percussion breath sounds equal no wheeze , rales or rhonchi. No chest wall deformities or tenderness.  . CV: PMI is nondisplaced, S1 S2 no gallops, murmurs, rubs. Peripheral pulses are full without delay.No JVD .  ABDOMEN: Bowel sounds normal nontender  No guard or rebound, no hepato splenomegal no CVA tenderness.   Extremtities:  No clubbing cyanosis or edema, no acute joint swelling or redness no focal atrophy high arched foot   NEURO:  Oriented x3, cranial nerves 3-12 appear to be intact, no obvious focal  weakness,gait within normal limits no abnormal reflexes or asymmetrical SKIN: No acute rashes normal turgor, color, no bruising or petechiae. PSYCH: Oriented, good eye contact, no obvious depression anxiety, cognition and judgment appear normal. LN: no cervical axillary inguinal adenopathy  Lab Results  Component Value Date   WBC 4.8 10/23/2016   HGB 13.4 10/23/2016   HCT 39.6 10/23/2016   PLT 251.0 10/23/2016   GLUCOSE 97 10/23/2016   CHOL 186 10/23/2016   TRIG 140.0 10/23/2016   HDL 34.20 (L) 10/23/2016   LDLCALC 124 (H) 10/23/2016   ALT 25 10/23/2016   AST 16 10/23/2016   NA 143 10/23/2016   K 3.9 10/23/2016   CL 105 10/23/2016   CREATININE 0.95 10/23/2016   BUN 19 10/23/2016   CO2 32 10/23/2016   TSH 1.85 10/23/2016   PSA 1.87 10/23/2016   HGBA1C 5.9 10/23/2016   MICROALBUR 1.7 07/08/2010    BP Readings from Last 3 Encounters:  11/12/17 132/90  04/17/17 124/70  11/01/16 134/72   Wt Readings from Last 3 Encounters:  11/12/17 227 lb (103 kg)  04/17/17 229 lb 6.4 oz (104.1 kg)  11/01/16 223 lb (101.2 kg)      ASSESSMENT AND PLAN:  Discussed the following assessment and plan:  Visit for preventive health examination - Plan: Lipid panel, CBC with Differential/Platelet, Basic metabolic panel, Hepatic function panel, Hepatic function panel, Basic metabolic panel, CBC with Differential/Platelet, Lipid panel  Medication management - Plan: Lipid panel, CBC with Differential/Platelet, Basic metabolic panel, Hepatic function panel, Hepatic function panel, Basic metabolic panel, CBC with Differential/Platelet, Lipid panel  Essential hypertension - Plan: Lipid panel, CBC with Differential/Platelet, Basic metabolic panel, Hepatic function panel, Hepatic  function panel, Basic metabolic panel, CBC with Differential/Platelet, Lipid panel  Hyperglycemia - Plan: Lipid panel, CBC with Differential/Platelet, Basic metabolic panel, Hepatic function panel, Hemoglobin A1c, Hemoglobin  A1c, Hepatic function panel, Basic metabolic panel, CBC with Differential/Platelet, Lipid panel  Low serum HDL - Plan: Lipid panel, CBC with Differential/Platelet, Basic metabolic panel, Hepatic function panel, Hepatic function panel, Basic metabolic panel, CBC with Differential/Platelet, Lipid panel  Need for influenza vaccination - Plan: Flu Vaccine QUAD 6+ mos PF IM (Fluarix Quad PF)  Screening PSA (prostate specific antigen) - Plan: PSA, PSA  BMI 35.0-35.9,adult  no tobacco   Dec etoh     bp control   Pt aware ! 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  6 mos med check bp etc advised  Patient Care Team: Kriston Pasquarello, Standley Brooking, MD as PCP - General Ninetta Lights, MD (Orthopedic Surgery) Inda Castle, MD (Inactive) (Gastroenterology) [facility] Patient Instructions  Will notify you  of labs when available. Lose some weight add exercise  consideration of adding   Statin  medication for  Risk reduction The 10-year ASCVD risk score Mikey Bussing DC Jr., et al., 2013) is: 30.4%   Values used to calculate the score:     Age: 37 years     Sex: Male     Is Non-Hispanic African American: No     Diabetic: Yes     Tobacco smoker: No     Systolic Blood Pressure: 627 mmHg     Is BP treated: Yes     HDL Cholesterol: 34.2 mg/dL     Total Cholesterol: 186 mg/dL    Health Maintenance, Male A healthy lifestyle and preventive care is important for your health and wellness. Ask your health care provider about what schedule of regular examinations is right for you. What should I know about weight and diet? Eat a Healthy Diet  Eat plenty of vegetables, fruits, whole grains, low-fat dairy products, and lean protein.  Do not eat a lot of foods high in solid fats, added sugars, or salt.  Maintain a Healthy Weight Regular exercise can help you achieve or maintain a healthy weight. You should:  Do at least 150 minutes of  exercise each week. The exercise should increase your heart rate and make you sweat (moderate-intensity exercise).  Do strength-training exercises at least twice a week.  Watch Your Levels of Cholesterol and Blood Lipids  Have your blood tested for lipids and cholesterol every 5 years starting at 65 years of age. If you are at high risk for heart disease, you should start having your blood tested when you are 65 years old. You may need to have your cholesterol levels checked more often if: ? Your lipid or cholesterol levels are high. ? You are older than 65 years of age. ? You are at high risk for heart disease.  What should I know about cancer screening? Many types of cancers can be detected early and may often be prevented. Lung Cancer  You should be screened every year for lung cancer if: ? You are a current smoker who has smoked for at least 30 years. ? You are a former smoker who has quit within the past 15 years.  Talk to your health care provider about your screening options, when you should start screening, and how often you should be screened.  Colorectal Cancer  Routine colorectal cancer  screening usually begins at 65 years of age and should be repeated every 5-10 years until you are 65 years old. You may need to be screened more often if early forms of precancerous polyps or small growths are found. Your health care provider may recommend screening at an earlier age if you have risk factors for colon cancer.  Your health care provider may recommend using home test kits to check for hidden blood in the stool.  A small camera at the end of a tube can be used to examine your colon (sigmoidoscopy or colonoscopy). This checks for the earliest forms of colorectal cancer.  Prostate and Testicular Cancer  Depending on your age and overall health, your health care provider may do certain tests to screen for prostate and testicular cancer.  Talk to your health care provider about  any symptoms or concerns you have about testicular or prostate cancer.  Skin Cancer  Check your skin from head to toe regularly.  Tell your health care provider about any new moles or changes in moles, especially if: ? There is a change in a mole's size, shape, or color. ? You have a mole that is larger than a pencil eraser.  Always use sunscreen. Apply sunscreen liberally and repeat throughout the day.  Protect yourself by wearing long sleeves, pants, a wide-brimmed hat, and sunglasses when outside.  What should I know about heart disease, diabetes, and high blood pressure?  If you are 35-79 years of age, have your blood pressure checked every 3-5 years. If you are 72 years of age or older, have your blood pressure checked every year. You should have your blood pressure measured twice-once when you are at a hospital or clinic, and once when you are not at a hospital or clinic. Record the average of the two measurements. To check your blood pressure when you are not at a hospital or clinic, you can use: ? An automated blood pressure machine at a pharmacy. ? A home blood pressure monitor.  Talk to your health care provider about your target blood pressure.  If you are between 52-73 years old, ask your health care provider if you should take aspirin to prevent heart disease.  Have regular diabetes screenings by checking your fasting blood sugar level. ? If you are at a normal weight and have a low risk for diabetes, have this test once every three years after the age of 58. ? If you are overweight and have a high risk for diabetes, consider being tested at a younger age or more often.  A one-time screening for abdominal aortic aneurysm (AAA) by ultrasound is recommended for men aged 75-75 years who are current or former smokers. What should I know about preventing infection? Hepatitis B If you have a higher risk for hepatitis B, you should be screened for this virus. Talk with your  health care provider to find out if you are at risk for hepatitis B infection. Hepatitis C Blood testing is recommended for:  Everyone born from [birth year] through 1963.  Anyone with known risk factors for hepatitis C.  Sexually Transmitted Diseases (STDs)  You should be screened each year for STDs including gonorrhea and chlamydia if: ? You are sexually active and are younger than 65 years of age. ? You are older than 66 years of age and your health care provider tells you that you are at risk for this type of infection. ? Your sexual activity has changed since you were last screened  and you are at an increased risk for chlamydia or gonorrhea. Ask your health care provider if you are at risk.  Talk with your health care provider about whether you are at high risk of being infected with HIV. Your health care provider may recommend a prescription medicine to help prevent HIV infection.  What else can I do?  Schedule regular health, dental, and eye exams.  Stay current with your vaccines (immunizations).  Do not use any tobacco products, such as cigarettes, chewing tobacco, and e-cigarettes. If you need help quitting, ask your health care provider.  Limit alcohol intake to no more than 2 drinks per day. One drink equals 12 ounces of beer, 5 ounces of wine, or 1 ounces of hard liquor.  Do not use street drugs.  Do not share needles.  Ask your health care provider for help if you need support or information about quitting drugs.  Tell your health care provider if you often feel depressed.  Tell your health care provider if you have ever been abused or do not feel safe at home. This information is not intended to replace advice given to you by your health care provider. Make sure you discuss any questions you have with your health care provider. Document Released: 03/16/2008 Document Revised: 05/17/2016 Document Reviewed: 06/22/2015 Elsevier Interactive Patient Education  2018 [place]. Dylin Ihnen M.D.

## 2017-11-12 ENCOUNTER — Encounter: Payer: Self-pay | Admitting: Internal Medicine

## 2017-11-12 ENCOUNTER — Ambulatory Visit (INDEPENDENT_AMBULATORY_CARE_PROVIDER_SITE_OTHER): Payer: Federal, State, Local not specified - PPO | Admitting: Internal Medicine

## 2017-11-12 VITALS — BP 132/90 | HR 62 | Temp 98.5°F | Ht 67.5 in | Wt 227.0 lb

## 2017-11-12 DIAGNOSIS — R739 Hyperglycemia, unspecified: Secondary | ICD-10-CM | POA: Diagnosis not present

## 2017-11-12 DIAGNOSIS — Z79899 Other long term (current) drug therapy: Secondary | ICD-10-CM

## 2017-11-12 DIAGNOSIS — I10 Essential (primary) hypertension: Secondary | ICD-10-CM

## 2017-11-12 DIAGNOSIS — Z0000 Encounter for general adult medical examination without abnormal findings: Secondary | ICD-10-CM

## 2017-11-12 DIAGNOSIS — R748 Abnormal levels of other serum enzymes: Secondary | ICD-10-CM | POA: Diagnosis not present

## 2017-11-12 DIAGNOSIS — Z6835 Body mass index (BMI) 35.0-35.9, adult: Secondary | ICD-10-CM

## 2017-11-12 DIAGNOSIS — Z23 Encounter for immunization: Secondary | ICD-10-CM

## 2017-11-12 DIAGNOSIS — Z125 Encounter for screening for malignant neoplasm of prostate: Secondary | ICD-10-CM

## 2017-11-12 NOTE — Patient Instructions (Signed)
Will notify you  of labs when available. Lose some weight add exercise  consideration of adding   Statin  medication for  Risk reduction The 10-year ASCVD risk score Jaime Orr DC Jr., et al., 2013) is: 30.4%   Values used to calculate the score:     Age: 67 years     Sex: Male     Is Non-Hispanic African American: No     Diabetic: Yes     Tobacco smoker: No     Systolic Blood Pressure: 811 mmHg     Is BP treated: Yes     HDL Cholesterol: 34.2 mg/dL     Total Cholesterol: 186 mg/dL    Health Maintenance, Male A healthy lifestyle and preventive care is important for your health and wellness. Ask your health care provider about what schedule of regular examinations is right for you. What should I know about weight and diet? Eat a Healthy Diet  Eat plenty of vegetables, fruits, whole grains, low-fat dairy products, and lean protein.  Do not eat a lot of foods high in solid fats, added sugars, or salt.  Maintain a Healthy Weight Regular exercise can help you achieve or maintain a healthy weight. You should:  Do at least 150 minutes of exercise each week. The exercise should increase your heart rate and make you sweat (moderate-intensity exercise).  Do strength-training exercises at least twice a week.  Watch Your Levels of Cholesterol and Blood Lipids  Have your blood tested for lipids and cholesterol every 5 years starting at 65 years of age. If you are at high risk for heart disease, you should start having your blood tested when you are 65 years old. You may need to have your cholesterol levels checked more often if: ? Your lipid or cholesterol levels are high. ? You are older than 65 years of age. ? You are at high risk for heart disease.  What should I know about cancer screening? Many types of cancers can be detected early and may often be prevented. Lung Cancer  You should be screened every year for lung cancer if: ? You are a current smoker who has smoked for at least 30  years. ? You are a former smoker who has quit within the past 15 years.  Talk to your health care provider about your screening options, when you should start screening, and how often you should be screened.  Colorectal Cancer  Routine colorectal cancer screening usually begins at 65 years of age and should be repeated every 5-10 years until you are 65 years old. You may need to be screened more often if early forms of precancerous polyps or small growths are found. Your health care provider may recommend screening at an earlier age if you have risk factors for colon cancer.  Your health care provider may recommend using home test kits to check for hidden blood in the stool.  A small camera at the end of a tube can be used to examine your colon (sigmoidoscopy or colonoscopy). This checks for the earliest forms of colorectal cancer.  Prostate and Testicular Cancer  Depending on your age and overall health, your health care provider may do certain tests to screen for prostate and testicular cancer.  Talk to your health care provider about any symptoms or concerns you have about testicular or prostate cancer.  Skin Cancer  Check your skin from head to toe regularly.  Tell your health care provider about any new moles or changes in  moles, especially if: ? There is a change in a mole's size, shape, or color. ? You have a mole that is larger than a pencil eraser.  Always use sunscreen. Apply sunscreen liberally and repeat throughout the day.  Protect yourself by wearing long sleeves, pants, a wide-brimmed hat, and sunglasses when outside.  What should I know about heart disease, diabetes, and high blood pressure?  If you are 42-8 years of age, have your blood pressure checked every 3-5 years. If you are 44 years of age or older, have your blood pressure checked every year. You should have your blood pressure measured twice-once when you are at a hospital or clinic, and once when you are  not at a hospital or clinic. Record the average of the two measurements. To check your blood pressure when you are not at a hospital or clinic, you can use: ? An automated blood pressure machine at a pharmacy. ? A home blood pressure monitor.  Talk to your health care provider about your target blood pressure.  If you are between 80-13 years old, ask your health care provider if you should take aspirin to prevent heart disease.  Have regular diabetes screenings by checking your fasting blood sugar level. ? If you are at a normal weight and have a low risk for diabetes, have this test once every three years after the age of 86. ? If you are overweight and have a high risk for diabetes, consider being tested at a younger age or more often.  A one-time screening for abdominal aortic aneurysm (AAA) by ultrasound is recommended for men aged 51-75 years who are current or former smokers. What should I know about preventing infection? Hepatitis B If you have a higher risk for hepatitis B, you should be screened for this virus. Talk with your health care provider to find out if you are at risk for hepatitis B infection. Hepatitis C Blood testing is recommended for:  Everyone born from [birth year] through 1963.  Anyone with known risk factors for hepatitis C.  Sexually Transmitted Diseases (STDs)  You should be screened each year for STDs including gonorrhea and chlamydia if: ? You are sexually active and are younger than 65 years of age. ? You are older than 65 years of age and your health care provider tells you that you are at risk for this type of infection. ? Your sexual activity has changed since you were last screened and you are at an increased risk for chlamydia or gonorrhea. Ask your health care provider if you are at risk.  Talk with your health care provider about whether you are at high risk of being infected with HIV. Your health care provider may recommend a prescription medicine to help  prevent HIV infection.  What else can I do?  Schedule regular health, dental, and eye exams.  Stay current with your vaccines (immunizations).  Do not use any tobacco products, such as cigarettes, chewing tobacco, and e-cigarettes. If you need help quitting, ask your health care provider.  Limit alcohol intake to no more than 2 drinks per day. One drink equals 12 ounces of beer, 5 ounces of wine, or 1 ounces of hard liquor.  Do not use street drugs.  Do not share needles.  Ask your health care provider for help if you need support or information about quitting drugs.  Tell your health care provider if you often feel depressed.  Tell your health care provider if you have ever been abused  or do not feel safe at home. This information is not intended to replace advice given to you by your health care provider. Make sure you discuss any questions you have with your health care provider. Document Released: 03/16/2008 Document Revised: 05/17/2016 Document Reviewed: 06/22/2015 Elsevier Interactive Patient Education  [employer].

## 2017-11-13 LAB — BASIC METABOLIC PANEL
BUN: 20 mg/dL (ref 6–23)
CHLORIDE: 100 meq/L (ref 96–112)
CO2: 30 mEq/L (ref 19–32)
CREATININE: 0.92 mg/dL (ref 0.40–1.50)
Calcium: 8.8 mg/dL (ref 8.4–10.5)
GFR: 87.93 mL/min (ref >60.00)
GLUCOSE: 90 mg/dL (ref 70–99)
Potassium: 3.6 mEq/L (ref 3.5–5.1)
Sodium: 139 mEq/L (ref 135–145)

## 2017-11-13 LAB — LIPID PANEL
CHOLESTEROL: 207 mg/dL — AB (ref 0–200)
HDL: 43.7 mg/dL (ref >39.00)
LDL Cholesterol: 139 mg/dL — ABNORMAL HIGH (ref 0–99)
NonHDL: 163.4
TRIGLYCERIDES: 124 mg/dL (ref 0.0–149.0)
Total CHOL/HDL Ratio: 5
VLDL: 24.8 mg/dL (ref 0.0–40.0)

## 2017-11-13 LAB — HEPATIC FUNCTION PANEL
ALK PHOS: 57 U/L (ref 39–117)
ALT: 27 U/L (ref 0–53)
AST: 19 U/L (ref 0–37)
Albumin: 4.1 g/dL (ref 3.5–5.2)
BILIRUBIN DIRECT: 0.1 mg/dL (ref 0.0–0.3)
BILIRUBIN TOTAL: 0.5 mg/dL (ref 0.2–1.2)
TOTAL PROTEIN: 6.7 g/dL (ref 6.0–8.3)

## 2017-11-13 LAB — CBC WITH DIFFERENTIAL/PLATELET
BASOS PCT: 1.1 % (ref 0.0–3.0)
Basophils Absolute: 0.1 10*3/uL (ref 0.0–0.1)
EOS ABS: 0.3 10*3/uL (ref 0.0–0.7)
Eosinophils Relative: 5.2 % — ABNORMAL HIGH (ref 0.0–5.0)
HCT: 42.8 % (ref 39.0–52.0)
HEMOGLOBIN: 14.3 g/dL (ref 13.0–17.0)
LYMPHS ABS: 1.7 10*3/uL (ref 0.7–4.0)
Lymphocytes Relative: 28.7 % (ref 12.0–46.0)
MCHC: 33.3 g/dL (ref 30.0–36.0)
MCV: 88 fl (ref 78.0–100.0)
MONO ABS: 0.5 10*3/uL (ref 0.1–1.0)
Monocytes Relative: 8.9 % (ref 3.0–12.0)
NEUTROS ABS: 3.4 10*3/uL (ref 1.4–7.7)
NEUTROS PCT: 56.1 % (ref 43.0–77.0)
PLATELETS: 281 10*3/uL (ref 150.0–400.0)
RBC: 4.87 Mil/uL (ref 4.22–5.81)
RDW: 15.6 % — AB (ref 11.5–15.5)
WBC: 6 10*3/uL (ref 4.0–10.5)

## 2017-11-13 LAB — PSA: PSA: 2.15 ng/mL (ref 0.10–4.00)

## 2017-11-13 LAB — HEMOGLOBIN A1C: Hgb A1c MFr Bld: 5.8 % (ref 4.6–6.5)

## 2017-11-14 ENCOUNTER — Other Ambulatory Visit: Payer: Self-pay | Admitting: Internal Medicine

## 2017-11-24 ENCOUNTER — Other Ambulatory Visit: Payer: Self-pay | Admitting: Internal Medicine

## 2017-11-26 ENCOUNTER — Telehealth: Payer: Self-pay | Admitting: Internal Medicine

## 2017-11-26 DIAGNOSIS — E785 Hyperlipidemia, unspecified: Secondary | ICD-10-CM

## 2017-11-26 MED ORDER — ATORVASTATIN CALCIUM 20 MG PO TABS: 20.0000 mg | ORAL_TABLET | Freq: Every day | ORAL | 0 refills | Status: DC

## 2017-11-26 NOTE — Telephone Encounter (Signed)
See result note Atorvastatin called into pharmacy.  Lipid panel order placed.  Lab appt scheduled for June 10th at 745am   Notes recorded by Burnis Medin, MD on 11/14/2017 at 4:50 PM EST Results are normal except cholesterol  Slightly worse than last year I suggest add trial of  Statin cholesterol medication to reduce risk of stroke and heart attack   Atorvastatin 20 mg 1 po qd disp 30 refill x 3   Check lipid panel Fasting in 3-4 months

## 2017-11-26 NOTE — Telephone Encounter (Signed)
Copied from [place] [id number]. Topic: General - Other >> Nov 26, 2017  2:51 PM Cecelia Byars, NT wrote: Reason for CRM: Patient called and said there was a message in his my chart in regards to him needing cholesterol medication  ,he says if it is needs please call in to [id number] - [place], [place] [phone] (Phone) [phone] (Fax) and just let him know .

## 2017-11-27 ENCOUNTER — Other Ambulatory Visit: Payer: Self-pay | Admitting: Internal Medicine

## 2017-11-29 MED ORDER — ESCITALOPRAM OXALATE 20 MG PO TABS: 20.0000 mg | ORAL_TABLET | Freq: Every day | ORAL | 0 refills | Status: DC

## 2017-11-29 MED ORDER — SILDENAFIL CITRATE 20 MG PO TABS: 20.0000 mg | ORAL_TABLET | Freq: Three times a day (TID) | ORAL | 0 refills | Status: DC

## 2017-12-01 ENCOUNTER — Other Ambulatory Visit: Payer: Self-pay | Admitting: Internal Medicine

## 2017-12-18 ENCOUNTER — Other Ambulatory Visit: Payer: Self-pay | Admitting: Internal Medicine

## 2017-12-26 ENCOUNTER — Other Ambulatory Visit: Payer: Self-pay | Admitting: Internal Medicine

## 2017-12-27 ENCOUNTER — Other Ambulatory Visit: Payer: Self-pay | Admitting: Internal Medicine

## 2018-02-17 ENCOUNTER — Other Ambulatory Visit: Payer: Self-pay | Admitting: Internal Medicine

## 2018-02-22 ENCOUNTER — Other Ambulatory Visit: Payer: Self-pay | Admitting: Internal Medicine

## 2018-03-11 ENCOUNTER — Other Ambulatory Visit (INDEPENDENT_AMBULATORY_CARE_PROVIDER_SITE_OTHER): Payer: Federal, State, Local not specified - PPO

## 2018-03-11 DIAGNOSIS — E785 Hyperlipidemia, unspecified: Secondary | ICD-10-CM

## 2018-03-11 LAB — LDL CHOLESTEROL, DIRECT: Direct LDL: 75 mg/dL

## 2018-03-11 LAB — LIPID PANEL
CHOL/HDL RATIO: 5
CHOLESTEROL: 139 mg/dL (ref 0–200)
HDL: 28.4 mg/dL — ABNORMAL LOW (ref >39.00)
NonHDL: 110.84
TRIGLYCERIDES: 221 mg/dL — AB (ref 0.0–149.0)
VLDL: 44.2 mg/dL — AB (ref 0.0–40.0)

## 2018-03-28 ENCOUNTER — Other Ambulatory Visit: Payer: Self-pay | Admitting: Internal Medicine

## 2018-03-29 ENCOUNTER — Other Ambulatory Visit: Payer: Self-pay | Admitting: Internal Medicine

## 2018-03-29 MED ORDER — AMLODIPINE BESYLATE 10 MG PO TABS: 10.0000 mg | ORAL_TABLET | Freq: Every day | ORAL | 1 refills | Status: DC

## 2018-03-29 MED ORDER — VALSARTAN-HYDROCHLOROTHIAZIDE 320-25 MG PO TABS: 1.0000 | ORAL_TABLET | Freq: Every day | ORAL | 1 refills | Status: DC

## 2018-05-13 NOTE — Progress Notes (Signed)
Chief Complaint  Patient presents with  . Follow-up    no new concerns. review labs    HPI: Jaime Orr 67 y.o. come in for Chronic disease management  Fu of lipid as he has begun on Lipitor in past   No se of med  States hasn't been eating healthy lately portionsizes lots of pizza.  But otherwise doing ok . Working 14 hours per week.  Mood ok etoh still "the same" Has seen urologists tif needed  Stopped the  nsaid mobic cause of  Swelling edema although helped the foot pain.  ROS: See pertinent positives and negatives per HPI.  Past Medical History:  Diagnosis Date  . Anxiety   . Cancer ([place])    skin cancer  . DEPRESSION 12/02/2008  . GERD 12/02/2008    no endo  . Heart murmur    born with  . 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 HYPERGLYCEMIA, MILD 01/19/2010  . HYPERTENSION 12/02/2008  . Hypertension   . IRON DEFICIENCY 07/18/2010   doantes blood  . Muscle fasciculation 11/14/2010  . Muscle twitch 11/14/2010   Left rm  Poss overuse  R/o metabolic    . OBESITY 08/09/2010  . SNORING 07/18/2010    Family History  Problem Relation Age of Onset  . Stroke Mother        71  . Depression Mother   . Hypertension Father   . Heart disease Father        29  . Alcohol abuse Father   . Colon polyps Father   . Sleep apnea Sister   . Colon cancer Neg Hx   . Esophageal cancer Neg Hx   . Rectal cancer Neg Hx   . Stomach cancer Neg Hx     Social History   Socioeconomic History  . Marital status: Married    Spouse name: Not on file  . Number of children: Not on file  . Years of education: Not on file  . Highest education level: Not on file  Occupational History  . Not on file  Social Needs  . Financial resource strain: Not on file  . Food insecurity:    Worry: Not on file    Inability: Not on file  . Transportation needs:    Medical: Not on file    Non-medical: Not on file  Tobacco Use  . Smoking status: Former Research scientist (life sciences)  . Smokeless tobacco: Never  Used  Substance and Sexual Activity  . Alcohol use: Yes    Alcohol/week: 14.0 standard drinks    Types: 14 Cans of beer per week    Comment: daily 2-3 beers/day  . Drug use: No  . Sexual activity: Not on file  Lifestyle  . Physical activity:    Days per week: Not on file    Minutes per session: Not on file  . Stress: Not on file  Relationships  . Social connections:    Talks on phone: Not on file    Gets together: Not on file    Attends religious service: Not on file    Active member of club or organization: Not on file    Attends meetings of clubs or organizations: Not on file    Relationship status: Not on file  Other Topics Concern  . 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   Originally from [place]  To [place] of 2 dogs and cats    Donates blood       Outpatient Medications Prior to Visit  Medication Sig Dispense Refill  . amLODipine (NORVASC) 10 MG tablet Take 1 tablet (10 mg total) by mouth daily. 90 tablet 1  . atorvastatin (LIPITOR) 20 MG tablet Take 1 tablet (20 mg total) by mouth daily. 90 tablet 0  . carvedilol (COREG) 25 MG tablet TAKE 1 TABLET (25 MG TOTAL) BY MOUTH 2 (TWO) TIMES DAILY WITH A MEAL. 180 tablet 1  . escitalopram (LEXAPRO) 20 MG tablet TAKE 1 TABLET BY MOUTH EVERY DAY 90 tablet 0  . esomeprazole (NEXIUM) 20 MG capsule Take 20 mg by mouth daily at 12 noon.    Marland Kitchen ketoconazole (NIZORAL) 2 % cream Apply 1 application topically 2 (two) times daily. 30 g 1  . meloxicam (MOBIC) 15 MG tablet Take 15 mg by mouth daily.    . potassium chloride SA (KLOR-CON M20) 20 MEQ tablet Take 1 tablet (20 mEq total) by mouth 2 (two) times daily. 180 tablet 0  . sildenafil (REVATIO) 20 MG tablet Take 1 tablet (20 mg total) by mouth 3 (three) times daily. 90  tablet 0  . tamsulosin (FLOMAX) 0.4 MG CAPS capsule Take 1 capsule (0.4 mg total) by mouth daily. Take 1 capsule daily ( Please contact Urology for further refills if still managing) 30 capsule 0  . triamcinolone cream (KENALOG) 0.1 % Apply 1 application topically 2 (two) times daily. As needed for itching 30 g 0  . valsartan-hydrochlorothiazide (DIOVAN-HCT) 320-25 MG tablet Take 1 tablet by mouth daily. 90 tablet 1   Facility-Administered Medications Prior to Visit  Medication Dose Route Frequency Provider Last Rate Last Dose  . fentaNYL (SUBLIMAZE) injection 25-50 mcg  25-50 mcg Intravenous Q5 min PRN Myrtie Soman, MD      . promethazine (PHENERGAN) injection 6.25-12.5 mg  6.25-12.5 mg Intravenous Q15 min PRN Myrtie Soman, MD         EXAM:  BP 130/70 (BP Location: Right Arm, Patient Position: Sitting, Cuff Size: Normal)   Pulse (!) 59   Temp 98 F (36.7 C) (Oral)   Wt 228 lb 1.6 oz (103.5 kg)   BMI 35.20 kg/m   Body mass index is 35.2 kg/m.  GENERAL: vitals reviewed and listed above, alert, oriented, appears well hydrated and in no acute distress HEENT: atraumatic, conjunctiva  clear, no obvious abnormalities on inspection of external nose and earsNECK: no obvious masses on inspection palpation  LUNGS: clear to auscultation bilaterally, no wheezes, rales or rhonchi, good air movement CV: HRRR, no clubbing cyanosis or  peripheral edema nl cap refill  MS: moves all extremities without noticeable focal  abnormality PSYCH: pleasant and cooperative, no obvious depression or anxiety Lab Results  Component Value Date   WBC 6.0 11/12/2017   HGB 14.3 11/12/2017   HCT 42.8 11/12/2017   PLT 281.0 11/12/2017   GLUCOSE 90 11/12/2017   CHOL 139 03/11/2018   TRIG 221.0 (H) 03/11/2018   HDL 28.40 (L) 03/11/2018   LDLDIRECT 75.0 03/11/2018   LDLCALC 139 (H) 11/12/2017   ALT 27 11/12/2017   AST 19 11/12/2017   NA 139 11/12/2017   K 3.6 11/12/2017   CL 100 11/12/2017   CREATININE 0.92  11/12/2017   BUN 20 11/12/2017   CO2 30 11/12/2017   TSH 1.85 10/23/2016   PSA 2.15 11/12/2017   HGBA1C 5.8 11/12/2017   MICROALBUR 1.7 07/08/2010   BP Readings from Last 3 Encounters:  05/14/18 130/70  11/12/17 132/90  04/17/17 124/70   Wt Readings from Last 3 Encounters:  05/14/18 228 lb 1.6 oz (103.5 kg)  11/12/17 227 lb (103 kg)  04/17/17 229 lb 6.4 oz (104.1 kg)   reviewed  Lipids with patient  ASSESSMENT AND PLAN:  Discussed the following assessment and plan:  Hyperlipidemia, unspecified hyperlipidemia type - Plan: Basic metabolic panel, CBC with Differential/Platelet, Hepatic function panel, Lipid panel, TSH, PSA  Medication management - Plan: Basic metabolic panel, CBC with Differential/Platelet, Hepatic function panel, Lipid panel, TSH, PSA  Essential hypertension - Plan: Basic metabolic panel, CBC with Differential/Platelet, Hepatic function panel, Lipid panel, TSH, PSA  Visit for preventive health examination  blood monitoring  - Plan: Basic metabolic panel, CBC with Differential/Platelet, Hepatic function panel, Lipid panel, TSH, PSA  Dyslipidemia - Plan: Basic metabolic panel, CBC with Differential/Platelet, Hepatic function panel, Lipid panel, TSH, PSA  Hyperglycemia - Plan: Basic metabolic panel, CBC with Differential/Platelet, Hepatic function panel, Lipid panel, TSH, PSA, Hemoglobin A1c  Screening PSA (prostate specific antigen) - Plan: Basic metabolic panel, CBC with Differential/Platelet, Hepatic function panel, Lipid panel, TSH, PSA  High blood triglycerides - w low hdl  counsled  lsi for this time    disc diet changes for tg reduction  continue lipitor   -Patient advised to return or notify health care team  if  new concerns arise.  Patient Instructions  Stay on the lipitor .   Work on  lifestyle intervention healthy eating and exercise .focus on changing snack .   Triglycerides are up.   CPX in fevruary with labs pre visit  I will put in orders .     Food Choices to Lower Your Triglycerides Triglycerides are a type of fat in your blood. High levels of triglycerides can increase the risk of heart disease and stroke. If your triglyceride levels are high, the foods you eat and your eating habits are very important. Choosing the right foods can help lower your triglycerides. What general guidelines do I need to follow?  Lose weight if you are overweight.  Limit or avoid alcohol.  Fill one half of your plate with vegetables and green salads.  Limit fruit to two servings a day. Choose fruit instead of juice.  Make one fourth of your plate whole grains. Look for the word "whole" as the first word in the ingredient list.  Fill one fourth of your plate with lean protein foods.  Enjoy fatty fish (such as salmon, mackerel, sardines, and tuna) three times a week.  Choose healthy fats.  Limit foods high in starch and sugar.  Eat more home-cooked food and less restaurant, buffet, and fast food.  Limit fried foods.  Cook foods using methods other than frying.  Limit saturated fats.  Check ingredient lists to avoid foods with partially hydrogenated oils (trans fats) in them. What foods can I eat? Grains Whole grains, such as whole wheat or whole grain breads, crackers, cereals, and pasta. Unsweetened oatmeal, bulgur, barley, quinoa, or brown rice. Corn or whole wheat flour tortillas. Vegetables Fresh or frozen vegetables (raw, steamed, roasted, or grilled). Green salads. Fruits All fresh, canned (in natural juice), or frozen fruits. Meat and Other Protein Products Ground beef (85% or leaner), grass-fed beef, or beef  trimmed of fat. Skinless chicken or [place]. Ground chicken or [place]. Pork trimmed of fat. All fish and seafood. Eggs. Dried beans, peas, or lentils. Unsalted nuts or seeds. Unsalted canned or dry beans. Dairy Low-fat dairy products, such as skim or 1% milk, 2% or reduced-fat cheeses, low-fat ricotta or cottage cheese, or  plain low-fat yogurt. Fats and Oils Tub margarines without trans fats. Light or reduced-fat mayonnaise and salad dressings. Avocado. Safflower, olive, or canola oils. Natural peanut or almond butter. The items listed above may not be a complete list of recommended foods or beverages. Contact your dietitian for more options. What foods are not recommended? Grains White bread. White pasta. White rice. Cornbread. Bagels, pastries, and croissants. Crackers that contain trans fat. Vegetables White potatoes. Corn. Creamed or fried vegetables. Vegetables in a cheese sauce. Fruits Dried fruits. Canned fruit in light or heavy syrup. Fruit juice. Meat and Other Protein Products Fatty cuts of meat. Ribs, chicken wings, bacon, sausage, bologna, salami, chitterlings, fatback, hot dogs, bratwurst, and packaged luncheon meats. Dairy Whole or 2% milk, cream, half-and-half, and cream cheese. Whole-fat or sweetened yogurt. Full-fat cheeses. Nondairy creamers and whipped toppings. Processed cheese, cheese spreads, or cheese curds. Sweets and Desserts Corn syrup, sugars, honey, and molasses. Candy. Jam and jelly. Syrup. Sweetened cereals. Cookies, pies, cakes, donuts, muffins, and ice cream. Fats and Oils Butter, stick margarine, lard, shortening, ghee, or bacon fat. Coconut, palm kernel, or palm oils. Beverages Alcohol. Sweetened drinks (such as sodas, lemonade, and fruit drinks or punches). The items listed above may not be a complete list of foods and beverages to avoid. Contact your dietitian for more information. This information is not intended to replace advice given to you by your health care provider. Make sure you discuss any questions you have with your health care provider. Document Released: 07/06/2004 Document Revised: 02/24/2016 Document Reviewed: 07/23/2013 Elsevier Interactive Patient Education  2017 Ray K. Panosh M.D.

## 2018-05-14 ENCOUNTER — Ambulatory Visit: Payer: Federal, State, Local not specified - PPO | Admitting: Internal Medicine

## 2018-05-14 ENCOUNTER — Encounter: Payer: Self-pay | Admitting: Internal Medicine

## 2018-05-14 VITALS — BP 130/70 | HR 59 | Temp 98.0°F | Wt 228.1 lb

## 2018-05-14 DIAGNOSIS — Z79899 Other long term (current) drug therapy: Secondary | ICD-10-CM

## 2018-05-14 DIAGNOSIS — Z125 Encounter for screening for malignant neoplasm of prostate: Secondary | ICD-10-CM

## 2018-05-14 DIAGNOSIS — R739 Hyperglycemia, unspecified: Secondary | ICD-10-CM

## 2018-05-14 DIAGNOSIS — E781 Pure hyperglyceridemia: Secondary | ICD-10-CM | POA: Insufficient documentation

## 2018-05-14 DIAGNOSIS — Z0000 Encounter for general adult medical examination without abnormal findings: Secondary | ICD-10-CM

## 2018-05-14 DIAGNOSIS — E785 Hyperlipidemia, unspecified: Secondary | ICD-10-CM | POA: Diagnosis not present

## 2018-05-14 DIAGNOSIS — I10 Essential (primary) hypertension: Secondary | ICD-10-CM

## 2018-05-14 NOTE — Patient Instructions (Addendum)
Stay on the lipitor .   Work on  lifestyle intervention healthy eating and exercise .focus on changing snack .   Triglycerides are up.   CPX in fevruary with labs pre visit  I will put in orders .   Food Choices to Lower Your Triglycerides Triglycerides are a type of fat in your blood. High levels of triglycerides can increase the risk of heart disease and stroke. If your triglyceride levels are high, the foods you eat and your eating habits are very important. Choosing the right foods can help lower your triglycerides. What general guidelines do I need to follow?  Lose weight if you are overweight.  Limit or avoid alcohol.  Fill one half of your plate with vegetables and green salads.  Limit fruit to two servings a day. Choose fruit instead of juice.  Make one fourth of your plate whole grains. Look for the word "whole" as the first word in the ingredient list.  Fill one fourth of your plate with lean protein foods.  Enjoy fatty fish (such as salmon, mackerel, sardines, and tuna) three times a week.  Choose healthy fats.  Limit foods high in starch and sugar.  Eat more home-cooked food and less restaurant, buffet, and fast food.  Limit fried foods.  Cook foods using methods other than frying.  Limit saturated fats.  Check ingredient lists to avoid foods with partially hydrogenated oils (trans fats) in them. What foods can I eat? Grains Whole grains, such as whole wheat or whole grain breads, crackers, cereals, and pasta. Unsweetened oatmeal, bulgur, barley, quinoa, or brown rice. Corn or whole wheat flour tortillas. Vegetables Fresh or frozen vegetables (raw, steamed, roasted, or grilled). Green salads. Fruits All fresh, canned (in natural juice), or frozen fruits. Meat and Other Protein Products Ground beef (85% or leaner), grass-fed beef, or beef trimmed of fat. Skinless chicken or [place]. Ground chicken or [place]. Pork trimmed of fat. All fish and seafood. Eggs.  Dried beans, peas, or lentils. Unsalted nuts or seeds. Unsalted canned or dry beans. Dairy Low-fat dairy products, such as skim or 1% milk, 2% or reduced-fat cheeses, low-fat ricotta or cottage cheese, or plain low-fat yogurt. Fats and Oils Tub margarines without trans fats. Light or reduced-fat mayonnaise and salad dressings. Avocado. Safflower, olive, or canola oils. Natural peanut or almond butter. The items listed above may not be a complete list of recommended foods or beverages. Contact your dietitian for more options. What foods are not recommended? Grains White bread. White pasta. White rice. Cornbread. Bagels, pastries, and croissants. Crackers that contain trans fat. Vegetables White potatoes. Corn. Creamed or fried vegetables. Vegetables in a cheese sauce. Fruits Dried fruits. Canned fruit in light or heavy syrup. Fruit juice. Meat and Other Protein Products Fatty cuts of meat. Ribs, chicken wings, bacon, sausage, bologna, salami, chitterlings, fatback, hot dogs, bratwurst, and packaged luncheon meats. Dairy Whole or 2% milk, cream, half-and-half, and cream cheese. Whole-fat or sweetened yogurt. Full-fat cheeses. Nondairy creamers and whipped toppings. Processed cheese, cheese spreads, or cheese curds. Sweets and Desserts Corn syrup, sugars, honey, and molasses. Candy. Jam and jelly. Syrup. Sweetened cereals. Cookies, pies, cakes, donuts, muffins, and ice cream. Fats and Oils Butter, stick margarine, lard, shortening, ghee, or bacon fat. Coconut, palm kernel, or palm oils. Beverages Alcohol. Sweetened drinks (such as sodas, lemonade, and fruit drinks or punches). The items listed above may not be a complete list of foods and beverages to avoid. Contact your dietitian for more information. This information  is not intended to replace advice given to you by your health care provider. Make sure you discuss any questions you have with your health care provider. Document Released:  07/06/2004 Document Revised: 02/24/2016 Document Reviewed: 07/23/2013 Elsevier Interactive Patient Education  2017 [employer].

## 2018-05-21 ENCOUNTER — Other Ambulatory Visit: Payer: Self-pay | Admitting: Internal Medicine

## 2018-05-22 ENCOUNTER — Other Ambulatory Visit: Payer: Self-pay | Admitting: Internal Medicine

## 2018-08-17 ENCOUNTER — Other Ambulatory Visit: Payer: Self-pay | Admitting: Internal Medicine

## 2018-08-21 ENCOUNTER — Other Ambulatory Visit: Payer: Self-pay | Admitting: Internal Medicine

## 2018-09-20 ENCOUNTER — Other Ambulatory Visit: Payer: Self-pay | Admitting: Internal Medicine

## 2018-11-13 ENCOUNTER — Other Ambulatory Visit: Payer: Self-pay | Admitting: Internal Medicine

## 2018-11-15 NOTE — Patient Instructions (Addendum)
Ok to wean      lexapro eventually to 5 mg per day   But would wait until settled in your new job and transition.   Get [place] a list or copy of immunizations done at employee health .   Alcohol moderation as discussed. Take the carvedilol twice a day for best bp control.  Will notify you  of labs when available.  If all ok then yearly cpx and labs   Wt Readings from Last 3 Encounters:  11/18/18 230 lb 3.2 oz (104.4 kg)  05/14/18 228 lb 1.6 oz (103.5 kg)  11/12/17 227 lb (103 kg)       Health Maintenance After Age 66 After age 66, you are at a higher risk for certain long-term diseases and infections as well as injuries from falls. Falls are a major cause of broken bones and head injuries in people who are older than age 66. Getting regular preventive care can help to keep you healthy and well. Preventive care includes getting regular testing and making lifestyle changes as recommended by your health care provider. Talk with your health care provider about:  Which screenings and tests you should have. A screening is a test that checks for a disease when you have no symptoms.  A diet and exercise plan that is right for you. What should I know about screenings and tests to prevent falls? Screening and testing are the best ways to find a health problem early. Early diagnosis and treatment give you the best chance of managing medical conditions that are common after age 66. Certain conditions and lifestyle choices may make you more likely to have a fall. Your health care provider may recommend:  Regular vision checks. Poor vision and conditions such as cataracts can make you more likely to have a fall. If you wear glasses, make sure to get your prescription updated if your vision changes.  Medicine review. Work with your health care provider to regularly review all of the medicines you are taking, including over-the-counter medicines. Ask your health care provider about any side effects that may  make you more likely to have a fall. Tell your health care provider if any medicines that you take make you feel dizzy or sleepy.  Osteoporosis screening. Osteoporosis is a condition that causes the bones to get weaker. This can make the bones weak and cause them to break more easily.  Blood pressure screening. Blood pressure changes and medicines to control blood pressure can make you feel dizzy.  Strength and balance checks. Your health care provider may recommend certain tests to check your strength and balance while standing, walking, or changing positions.  Foot health exam. Foot pain and numbness, as well as not wearing proper footwear, can make you more likely to have a fall.  Depression screening. You may be more likely to have a fall if you have a fear of falling, feel emotionally low, or feel unable to do activities that you used to do.  Alcohol use screening. Using too much alcohol can affect your balance and may make you more likely to have a fall. What actions can I take to lower my risk of falls? General instructions  Talk with your health care provider about your risks for falling. Tell your health care provider if: ? You fall. Be sure to tell your health care provider about all falls, even ones that seem minor. ? You feel dizzy, sleepy, or off-balance.  Take over-the-counter and prescription medicines only as told  by your health care provider. These include any supplements.  Eat a healthy diet and maintain a healthy weight. A healthy diet includes low-fat dairy products, low-fat (lean) meats, and fiber from whole grains, beans, and lots of fruits and vegetables. Home safety  Remove any tripping hazards, such as rugs, cords, and clutter.  Install safety equipment such as grab bars in bathrooms and safety rails on stairs.  Keep rooms and walkways well-lit. Activity   Follow a regular exercise program to stay fit. This will help you maintain your balance. Ask your health  care provider what types of exercise are appropriate for you.  If you need a cane or walker, use it as recommended by your health care provider.  Wear supportive shoes that have nonskid soles. Lifestyle  Do not drink alcohol if your health care provider tells you not to drink.  If you drink alcohol, limit how much you have: ? 0-1 drink a day for women. ? 0-2 drinks a day for men.  Be aware of how much alcohol is in your drink. In the [place] one drink equals one typical bottle of beer (12 oz), one-half glass of wine (5 oz), or one shot of hard liquor (1 oz).  Do not use any products that contain nicotine or tobacco, such as cigarettes and e-cigarettes. If you need help quitting, ask your health care provider. Summary  Having a healthy lifestyle and getting preventive care can help to protect your health and wellness after age 66.  Screening and testing are the best way to find a health problem early and help you avoid having a fall. Early diagnosis and treatment give you the best chance for managing medical conditions that are more common for people who are older than age 66.  Falls are a major cause of broken bones and head injuries in people who are older than age 66. Take precautions to prevent a fall at home.  Work with your health care provider to learn what changes you can make to improve your health and wellness and to prevent falls. This information is not intended to replace advice given to you by your health care provider. Make sure you discuss any questions you have with your health care provider. Document Released: 08/01/2017 Document Revised: 08/01/2017 Document Reviewed: 08/01/2017 Elsevier Interactive Patient Education  2019 [employer].

## 2018-11-15 NOTE — Progress Notes (Signed)
No chief complaint on file.   HPI:  Patient  Jaime Orr  68 y.o. comes in today for Preventive Health Care visit  And Chronic disease management    To start new job  20 hours [employer]   Weaned off the    10 mg.  And seems to be doing fine with anxiety   otc  nexium  Once a day  controlls HB  BP meds still taking x only once a day coreg   bp sometime high at home in 150 range   No cp sob  Neuro sx   Lipid taking atorva  etoh about 3 per night   Urology no change on flomax   Health Maintenance  Topic Date Due  . Hepatitis C Screening  1951/02/06  . FOOT EXAM  06/27/1963  . OPHTHALMOLOGY EXAM  06/27/1963  . TETANUS/TDAP  10/02/2016  . INFLUENZA VACCINE  05/02/2018  . HEMOGLOBIN A1C  05/12/2018  . PNA vac Low Risk Adult (1 of 2 - PCV13) 06/26/2018  . COLONOSCOPY  05/17/2026  . HIV Screening  Completed   Health Maintenance Review LIFESTYLE:  Exercise:   More now every day walking  1-2 miles .    Tobacco/ETS:  no Alcohol: same almost daily 3 per day  dpednding on schedule .  Beer  Sugar beverages: Sleep: ok 9 hours  Drug use: no HH of 2 Work:seea bove odd jobs    Now to do 20 hours [employer]  Has had immuniz at employee health     ROS:  GEN/ HEENT: No fever, significant weight changes sweats headaches vision problems hearing changes, CV/ PULM; No chest pain shortness of breath cough, syncope,edema  change in exercise tolerance. GI /GU: No adominal pain, vomiting, change in bowel habits. No blood in the stool. No significant GU symptoms. SKIN/HEME: ,no acute skin rashes suspicious lesions or bleeding. No lymphadenopathy, nodules, masses.  NEURO/ PSYCH:  No neurologic signs such as weakness numbness. No depression anxiety. IMM/ Allergy: No unusual infections.  Allergy .   REST of 12 system review negative except as per HPI   Past Medical History:  Diagnosis Date  . Anxiety   . Cancer ([place])    skin cancer  . DEPRESSION 12/02/2008  . GERD 12/02/2008    no endo    . Heart murmur    born with  . 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 HYPERGLYCEMIA, MILD 01/19/2010  . HYPERTENSION 12/02/2008  . Hypertension   . IRON DEFICIENCY 07/18/2010   doantes blood  . Muscle fasciculation 11/14/2010  . Muscle twitch 11/14/2010   Left rm  Poss overuse  R/o metabolic    . OBESITY 08/09/2010  . SNORING 07/18/2010    Past Surgical History:  Procedure Laterality Date  . COLONOSCOPY    . KNEE ARTHROSCOPY WITH LATERAL MENISECTOMY Right 04/22/2015   Procedure: KNEE ARTHROSCOPY WITH LATERAL MENISECTOMY;  Surgeon: Kathryne Hitch, MD;  Location: [facility];  Service: Orthopedics;  Laterality: Right;  . KNEE ARTHROSCOPY WITH MEDIAL MENISECTOMY Right 04/22/2015   Procedure: RIGHT KNEE ARTHROSCOPY WITH  CHONDROPLASTY MEDIAL MENISECTOMY;  Surgeon: Kathryne Hitch, MD;  Location: [facility];  Service: Orthopedics;  Laterality: Right;  . KNEE CARTILAGE SURGERY  2009   Left Knee   . POLYPECTOMY    . SHOULDER SURGERY  1994   Left  . TONSILLECTOMY  1963  . TONSILLECTOMY      Family History  Problem Relation Age of Onset  . Stroke Mother        92  . Depression Mother   . Hypertension Father   . Heart disease Father        63  . Alcohol abuse Father   . Colon polyps Father   . Sleep apnea Sister   . Colon cancer Neg Hx   . Esophageal cancer Neg Hx   . Rectal cancer Neg Hx   . Stomach cancer Neg Hx     Social History   Socioeconomic History  . Marital status: Married    Spouse name: Not on file  . Number of children: Not on file  . Years of education: Not on file  . Highest education level: Not on file  Occupational History  . Not on file  Social Needs  . Financial resource strain: Not on file  . Food insecurity:    Worry: Not on file    Inability: Not on file  . Transportation needs:    Medical: Not on file    Non-medical: Not on file  Tobacco Use  . Smoking status: Former Research scientist (life sciences)  . Smokeless tobacco: Never  Used  Substance and Sexual Activity  . Alcohol use: Yes    Alcohol/week: 14.0 standard drinks    Types: 14 Cans of beer per week    Comment: daily 2-3 beers/day  . Drug use: No  . Sexual activity: Not on file  Lifestyle  . Physical activity:    Days per week: Not on file    Minutes per session: Not on file  . Stress: Not on file  Relationships  . Social connections:    Talks on phone: Not on file    Gets together: Not on file    Attends religious service: Not on file    Active member of club or organization: Not on file    Attends meetings of clubs or organizations: Not on file    Relationship status: Not on file  Other Topics Concern  . 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   Originally from [place]  To [place] of 2 dogs and cats    Donates blood       Outpatient Medications Prior to Visit  Medication Sig Dispense Refill  . atorvastatin (LIPITOR) 20 MG tablet TAKE 1 TABLET BY MOUTH EVERY DAY 90 tablet 0  . carvedilol (COREG) 25 MG tablet TAKE 1 TABLET (25 MG TOTAL) BY MOUTH 2 (TWO) TIMES DAILY WITH A MEAL. 180 tablet 1  . escitalopram (LEXAPRO) 20 MG tablet TAKE 1 TABLET BY MOUTH EVERY DAY 90 tablet 0  . esomeprazole (NEXIUM) 20 MG capsule Take 20 mg by mouth daily at 12 noon.    Marland Kitchen ketoconazole (NIZORAL) 2 % cream Apply 1 application topically 2 (two) times daily. 30 g 1  . meloxicam (MOBIC) 15 MG tablet Take 15 mg by mouth daily.    . sildenafil (REVATIO) 20 MG tablet Take 1 tablet (20 mg total) by mouth 3 (three) times daily. 90 tablet 0  . tamsulosin (FLOMAX) 0.4 MG CAPS capsule Take 1 capsule (0.4 mg total) by mouth daily. Take 1 capsule daily ( Please contact Urology for further  refills if still managing) 30 capsule 0  . potassium chloride SA  (KLOR-CON M20) 20 MEQ tablet Take 1 tablet (20 mEq total) by mouth 2 (two) times daily. 180 tablet 0  . amLODipine (NORVASC) 10 MG tablet TAKE 1 TABLET BY MOUTH EVERY DAY 90 tablet 1  . triamcinolone cream (KENALOG) 0.1 % Apply 1 application topically 2 (two) times daily. As needed for itching (Patient not taking: Reported on 11/18/2018) 30 g 0  . valsartan-hydrochlorothiazide (DIOVAN-HCT) 320-25 MG tablet TAKE 1 TABLET BY MOUTH EVERY DAY 90 tablet 1   Facility-Administered Medications Prior to Visit  Medication Dose Route Frequency Provider Last Rate Last Dose  . fentaNYL (SUBLIMAZE) injection 25-50 mcg  25-50 mcg Intravenous Q5 min PRN Myrtie Soman, MD      . promethazine (PHENERGAN) injection 6.25-12.5 mg  6.25-12.5 mg Intravenous Q15 min PRN Myrtie Soman, MD         EXAM:  BP 130/72 (BP Location: Right Arm, Patient Position: Sitting, Cuff Size: Large)   Pulse 66   Temp 98.8 F (37.1 C) (Oral)   Ht '5\' 6"'$  (1.676 m)   Wt 230 lb 3.2 oz (104.4 kg)   BMI 37.16 kg/m   Body mass index is 37.16 kg/m. Wt Readings from Last 3 Encounters:  11/18/18 230 lb 3.2 oz (104.4 kg)  05/14/18 228 lb 1.6 oz (103.5 kg)  11/12/17 227 lb (103 kg)    Physical Exam: Vital signs reviewed [id number] is a well-developed well-nourished alert cooperative    who appearsr stated age in no acute distress.  HEENT: normocephalic atraumatic , Eyes: PERRL EOM's full, conjunctiva clear, Nares: paten,t no deformity discharge or tenderness., Ears: no deformity EAC's clear TMs with normal landmarks. Mouth: clear OP, no lesions, edema.  Moist mucous membranes. Dentition in adequate repair. NECK: supple without masses, thyromegaly or bruits. CHEST/PULM:  Clear to auscultation and percussion breath sounds equal no wheeze , rales or rhonchi. No chest wall deformities or tenderness.  CV: PMI is nondisplaced, S1 S2 no gallops, murmurs, rubs. Peripheral pulses are full without delay.No JVD  ABDOMEN: Bowel sounds normal  nontender  No guard or rebound, no hepato splenomegal no CVA tenderness.   Extremtities:  No clubbing cyanosis or edema, no acute joint swelling or redness no focal atrophy  High arch foot  NEURO:  Oriented x3, cranial nerves 3-12 appear to be intact, no obvious focal weakness,gait within normal limits no abnormal reflexes SKIN: No acute rashes normal turgor, color, no bruising or petechiae. PSYCH: Oriented, good eye contact, no obvious depression anxiety, cognition and judgment appear normal. LN: no cervical axillary i adenopathy  Lab Results  Component Value Date   WBC 6.0 11/12/2017   HGB 14.3 11/12/2017   HCT 42.8 11/12/2017   PLT 281.0 11/12/2017   GLUCOSE 90 11/12/2017   CHOL 139 03/11/2018   TRIG 221.0 (H) 03/11/2018   HDL 28.40 (L) 03/11/2018   LDLDIRECT 75.0 03/11/2018   LDLCALC 139 (H) 11/12/2017   ALT 27 11/12/2017   AST 19 11/12/2017   NA 139 11/12/2017   K 3.6 11/12/2017   CL 100 11/12/2017   CREATININE 0.92 11/12/2017   BUN 20 11/12/2017   CO2 30 11/12/2017   TSH 1.85 10/23/2016   PSA 2.15 11/12/2017   HGBA1C 5.8 11/12/2017   MICROALBUR 1.7 07/08/2010    BP Readings from Last 3 Encounters:  11/18/18 130/72  05/14/18 130/70  11/12/17 132/90      ASSESSMENT AND PLAN:  Discussed the following assessment and  plan:  Visit for preventive health examination  Screening PSA (prostate specific antigen) - Plan: CBC with Differential/Platelet, PSA  HYPERGLYCEMIA, MILD - Plan: CBC with Differential/Platelet  High blood triglycerides - Plan: Basic metabolic panel, CBC with Differential/Platelet, Hepatic function panel, Lipid panel, TSH  Essential hypertension - Plan: Basic metabolic panel, CBC with Differential/Platelet, Hepatic function panel, Lipid panel, TSH  Medication management - Plan: Basic metabolic panel, CBC with Differential/Platelet, Hepatic function panel, Lipid panel, TSH  Hyperglycemia - Plan: Basic metabolic panel, CBC with  Differential/Platelet, Hepatic function panel, Lipid panel, TSH, Hemoglobin A1c hihg bmi  Advised healthy weight loss   Med management   Shared Decision Making  Candisc  He is taking 10 mg lexapro and poss wean to 5 mg and sen [place] message if wants rx for 10 mg  To cut in half  Otherwise  rov in a year or earlier for med management if needed   Will  Check immuniz  After info from employee healthy  May need pneumovax etc  He is getting mmr( live vaccine)     So will wait  Not high risk  Patient Care Team: Panosh, Standley Brooking, MD as PCP - General Ninetta Lights, MD (Orthopedic Surgery) Inda Castle, MD (Inactive) (Gastroenterology) [facility] dermatology Patient Instructions   Ok to wean      lexapro eventually to 5 mg per day   But would wait until settled in your new job and transition.   Get [place] a list or copy of immunizations done at employee health .   Alcohol moderation as discussed. Take the carvedilol twice a day for best bp control.  Will notify you  of labs when available.  If all ok then yearly cpx and labs   Wt Readings from Last 3 Encounters:  11/18/18 230 lb 3.2 oz (104.4 kg)  05/14/18 228 lb 1.6 oz (103.5 kg)  11/12/17 227 lb (103 kg)       Health Maintenance After Age 65 After age 39, you are at a higher risk for certain long-term diseases and infections as well as injuries from falls. Falls are a major cause of broken bones and head injuries in people who are older than age 22. Getting regular preventive care can help to keep you healthy and well. Preventive care includes getting regular testing and making lifestyle changes as recommended by your health care provider. Talk with your health care provider about:  Which screenings and tests you should have. A screening is a test that checks for a disease when you have no symptoms.  A diet and exercise plan that is right for you. What should I know about screenings and tests to prevent falls? Screening and testing are  the best ways to find a health problem early. Early diagnosis and treatment give you the best chance of managing medical conditions that are common after age 36. Certain conditions and lifestyle choices may make you more likely to have a fall. Your health care provider may recommend:  Regular vision checks. Poor vision and conditions such as cataracts can make you more likely to have a fall. If you wear glasses, make sure to get your prescription updated if your vision changes.  Medicine review. Work with your health care provider to regularly review all of the medicines you are taking, including over-the-counter medicines. Ask your health care provider about any side effects that may make you more likely to have a fall. Tell your health care provider if any medicines  that you take make you feel dizzy or sleepy.  Osteoporosis screening. Osteoporosis is a condition that causes the bones to get weaker. This can make the bones weak and cause them to break more easily.  Blood pressure screening. Blood pressure changes and medicines to control blood pressure can make you feel dizzy.  Strength and balance checks. Your health care provider may recommend certain tests to check your strength and balance while standing, walking, or changing positions.  Foot health exam. Foot pain and numbness, as well as not wearing proper footwear, can make you more likely to have a fall.  Depression screening. You may be more likely to have a fall if you have a fear of falling, feel emotionally low, or feel unable to do activities that you used to do.  Alcohol use screening. Using too much alcohol can affect your balance and may make you more likely to have a fall. What actions can I take to lower my risk of falls? General instructions  Talk with your health care provider about your risks for falling. Tell your health care provider if: ? You fall. Be sure to tell your health care provider about all falls, even ones that  seem minor. ? You feel dizzy, sleepy, or off-balance.  Take over-the-counter and prescription medicines only as told by your health care provider. These include any supplements.  Eat a healthy diet and maintain a healthy weight. A healthy diet includes low-fat dairy products, low-fat (lean) meats, and fiber from whole grains, beans, and lots of fruits and vegetables. Home safety  Remove any tripping hazards, such as rugs, cords, and clutter.  Install safety equipment such as grab bars in bathrooms and safety rails on stairs.  Keep rooms and walkways well-lit. Activity   Follow a regular exercise program to stay fit. This will help you maintain your balance. Ask your health care provider what types of exercise are appropriate for you.  If you need a cane or walker, use it as recommended by your health care provider.  Wear supportive shoes that have nonskid soles. Lifestyle  Do not drink alcohol if your health care provider tells you not to drink.  If you drink alcohol, limit how much you have: ? 0-1 drink a day for women. ? 0-2 drinks a day for men.  Be aware of how much alcohol is in your drink. In the [place] one drink equals one typical bottle of beer (12 oz), one-half glass of wine (5 oz), or one shot of hard liquor (1 oz).  Do not use any products that contain nicotine or tobacco, such as cigarettes and e-cigarettes. If you need help quitting, ask your health care provider. Summary  Having a healthy lifestyle and getting preventive care can help to protect your health and wellness after age 11.  Screening and testing are the best way to find a health problem early and help you avoid having a fall. Early diagnosis and treatment give you the best chance for managing medical conditions that are more common for people who are older than age 67.  Falls are a major cause of broken bones and head injuries in people who are older than age 71. Take precautions to prevent a fall at  home.  Work with your health care provider to learn what changes you can make to improve your health and wellness and to prevent falls. This information is not intended to replace advice given to you by your health care provider. Make sure you discuss  any questions you have with your health care provider. Document Released: 08/01/2017 Document Revised: 08/01/2017 Document Reviewed: 08/01/2017 Elsevier Interactive Patient Education  2019 [place] K. Panosh M.D.

## 2018-11-18 ENCOUNTER — Encounter: Payer: Self-pay | Admitting: Internal Medicine

## 2018-11-18 ENCOUNTER — Ambulatory Visit (INDEPENDENT_AMBULATORY_CARE_PROVIDER_SITE_OTHER): Payer: Federal, State, Local not specified - PPO | Admitting: Internal Medicine

## 2018-11-18 VITALS — BP 130/72 | HR 66 | Temp 98.8°F | Ht 66.0 in | Wt 230.2 lb

## 2018-11-18 DIAGNOSIS — Z0000 Encounter for general adult medical examination without abnormal findings: Secondary | ICD-10-CM | POA: Diagnosis not present

## 2018-11-18 DIAGNOSIS — R739 Hyperglycemia, unspecified: Secondary | ICD-10-CM | POA: Diagnosis not present

## 2018-11-18 DIAGNOSIS — E781 Pure hyperglyceridemia: Secondary | ICD-10-CM | POA: Diagnosis not present

## 2018-11-18 DIAGNOSIS — Z125 Encounter for screening for malignant neoplasm of prostate: Secondary | ICD-10-CM | POA: Diagnosis not present

## 2018-11-18 DIAGNOSIS — I10 Essential (primary) hypertension: Secondary | ICD-10-CM

## 2018-11-18 DIAGNOSIS — Z79899 Other long term (current) drug therapy: Secondary | ICD-10-CM

## 2018-11-18 DIAGNOSIS — R7309 Other abnormal glucose: Secondary | ICD-10-CM

## 2018-11-18 LAB — LIPID PANEL
Cholesterol: 130 mg/dL (ref 0–200)
HDL: 39.2 mg/dL (ref >39.00)
LDL Cholesterol: 68 mg/dL (ref 0–99)
NonHDL: 91.24
TRIGLYCERIDES: 115 mg/dL (ref 0.0–149.0)
Total CHOL/HDL Ratio: 3
VLDL: 23 mg/dL (ref 0.0–40.0)

## 2018-11-18 LAB — HEPATIC FUNCTION PANEL
ALT: 27 U/L (ref 0–53)
AST: 18 U/L (ref 0–37)
Albumin: 3.9 g/dL (ref 3.5–5.2)
Alkaline Phosphatase: 62 U/L (ref 39–117)
Bilirubin, Direct: 0.1 mg/dL (ref 0.0–0.3)
TOTAL PROTEIN: 6.3 g/dL (ref 6.0–8.3)
Total Bilirubin: 0.7 mg/dL (ref 0.2–1.2)

## 2018-11-18 LAB — PSA: PSA: 1.83 ng/mL (ref 0.10–4.00)

## 2018-11-18 LAB — CBC WITH DIFFERENTIAL/PLATELET
BASOS PCT: 0.7 % (ref 0.0–3.0)
Basophils Absolute: 0 10*3/uL (ref 0.0–0.1)
Eosinophils Absolute: 0.2 10*3/uL (ref 0.0–0.7)
Eosinophils Relative: 3 % (ref 0.0–5.0)
HEMATOCRIT: 42.8 % (ref 39.0–52.0)
HEMOGLOBIN: 14.6 g/dL (ref 13.0–17.0)
LYMPHS PCT: 32.1 % (ref 12.0–46.0)
Lymphs Abs: 1.8 10*3/uL (ref 0.7–4.0)
MCHC: 34.2 g/dL (ref 30.0–36.0)
MCV: 88.3 fl (ref 78.0–100.0)
MONOS PCT: 9.4 % (ref 3.0–12.0)
Monocytes Absolute: 0.5 10*3/uL (ref 0.1–1.0)
NEUTROS ABS: 3 10*3/uL (ref 1.4–7.7)
Neutrophils Relative %: 54.8 % (ref 43.0–77.0)
PLATELETS: 261 10*3/uL (ref 150.0–400.0)
RBC: 4.85 Mil/uL (ref 4.22–5.81)
RDW: 13.6 % (ref 11.5–15.5)
WBC: 5.5 10*3/uL (ref 4.0–10.5)

## 2018-11-18 LAB — BASIC METABOLIC PANEL
BUN: 18 mg/dL (ref 6–23)
CHLORIDE: 100 meq/L (ref 96–112)
CO2: 31 meq/L (ref 19–32)
CREATININE: 0.89 mg/dL (ref 0.40–1.50)
Calcium: 8.8 mg/dL (ref 8.4–10.5)
GFR: 85.68 mL/min (ref >60.00)
Glucose, Bld: 104 mg/dL — ABNORMAL HIGH (ref 70–99)
POTASSIUM: 3.9 meq/L (ref 3.5–5.1)
Sodium: 139 mEq/L (ref 135–145)

## 2018-11-18 LAB — HEMOGLOBIN A1C: Hgb A1c MFr Bld: 5.7 % (ref 4.6–6.5)

## 2018-11-18 MED ORDER — POTASSIUM CHLORIDE CRYS ER 20 MEQ PO TBCR: 20.0000 meq | EXTENDED_RELEASE_TABLET | Freq: Two times a day (BID) | ORAL | 0 refills | Status: DC

## 2018-11-19 LAB — TSH: TSH: 4.08 u[IU]/mL (ref 0.35–4.50)

## 2018-11-19 NOTE — Telephone Encounter (Signed)
Please update his  Record    At some point he will be due for pneumonia vaccine we give at age 68 but would wait until  Well past the  mmr vaccines .  NO rush

## 2018-11-24 ENCOUNTER — Other Ambulatory Visit: Payer: Self-pay | Admitting: Internal Medicine

## 2018-12-16 ENCOUNTER — Ambulatory Visit: Payer: Federal, State, Local not specified - PPO | Admitting: Family Medicine

## 2018-12-16 ENCOUNTER — Encounter: Payer: Self-pay | Admitting: Family Medicine

## 2018-12-16 ENCOUNTER — Other Ambulatory Visit: Payer: Self-pay

## 2018-12-16 VITALS — BP 138/82 | HR 75 | Temp 99.0°F | Wt 223.2 lb

## 2018-12-16 DIAGNOSIS — R509 Fever, unspecified: Secondary | ICD-10-CM | POA: Diagnosis not present

## 2018-12-16 DIAGNOSIS — J019 Acute sinusitis, unspecified: Secondary | ICD-10-CM | POA: Diagnosis not present

## 2018-12-16 LAB — POCT INFLUENZA A/B
INFLUENZA B, POC: NEGATIVE
Influenza A, POC: NEGATIVE

## 2018-12-16 MED ORDER — AZITHROMYCIN 250 MG PO TABS: ORAL_TABLET | ORAL | 0 refills | Status: DC

## 2018-12-16 MED ORDER — HYDROCODONE-HOMATROPINE 5-1.5 MG/5ML PO SYRP: 5.0000 mL | ORAL_SOLUTION | ORAL | 0 refills | Status: DC | PRN

## 2018-12-16 NOTE — Progress Notes (Signed)
   Subjective:    Patient ID: Jaime Orr, male    DOB: Apr 03, 1951, 68 y.o.   MRN: [id number]  HPI Here for 2 weeks of stuffy head, blowing yellow mucus form the nose, PND, and a dry cough. No fever.    Review of Systems  Constitutional: Negative.   HENT: Positive for congestion, postnasal drip and sinus pressure. Negative for sinus pain and sore throat.   Eyes: Negative.   Respiratory: Positive for cough.        Objective:   Physical Exam Constitutional:      Appearance: Normal appearance. He is not ill-appearing.  HENT:     Right Ear: Tympanic membrane and ear canal normal.     Left Ear: Tympanic membrane and ear canal normal.     Nose: Nose normal.     Mouth/Throat:     Pharynx: Oropharynx is clear.  Eyes:     Conjunctiva/sclera: Conjunctivae normal.  Pulmonary:     Effort: Pulmonary effort is normal. No respiratory distress.     Breath sounds: Normal breath sounds. No stridor. No wheezing, rhonchi or rales.  Lymphadenopathy:     Cervical: No cervical adenopathy.  Neurological:     Mental Status: He is alert.           Assessment & Plan:  Sinusitis, treat with a Zpack. Written out of work today and tomorrow.  Alysia Penna, MD

## 2018-12-19 ENCOUNTER — Other Ambulatory Visit: Payer: Self-pay | Admitting: Internal Medicine

## 2019-02-04 ENCOUNTER — Other Ambulatory Visit: Payer: Self-pay | Admitting: Internal Medicine

## 2019-02-25 ENCOUNTER — Other Ambulatory Visit: Payer: Self-pay | Admitting: Internal Medicine

## 2019-03-15 ENCOUNTER — Other Ambulatory Visit: Payer: Self-pay | Admitting: Internal Medicine

## 2019-04-30 ENCOUNTER — Other Ambulatory Visit: Payer: Self-pay | Admitting: Internal Medicine

## 2019-05-21 ENCOUNTER — Other Ambulatory Visit: Payer: Self-pay

## 2019-05-21 MED ORDER — POTASSIUM CHLORIDE CRYS ER 20 MEQ PO TBCR: 20.0000 meq | EXTENDED_RELEASE_TABLET | Freq: Two times a day (BID) | ORAL | 0 refills | Status: DC

## 2019-06-13 ENCOUNTER — Other Ambulatory Visit: Payer: Self-pay | Admitting: Internal Medicine

## 2019-07-26 ENCOUNTER — Other Ambulatory Visit: Payer: Self-pay | Admitting: Internal Medicine

## 2019-07-27 ENCOUNTER — Other Ambulatory Visit: Payer: Self-pay

## 2019-07-28 MED ORDER — TAMSULOSIN HCL 0.4 MG PO CAPS: 0.4000 mg | ORAL_CAPSULE | Freq: Every day | ORAL | 0 refills | Status: AC

## 2019-07-28 MED ORDER — ATORVASTATIN CALCIUM 20 MG PO TABS: 20.0000 mg | ORAL_TABLET | Freq: Every day | ORAL | 1 refills | Status: DC

## 2019-07-28 NOTE — Telephone Encounter (Signed)
Message routed to PCP CMA  

## 2019-07-28 NOTE — Telephone Encounter (Signed)
Rx's sent to pharmacy as requested. 

## 2019-08-15 ENCOUNTER — Other Ambulatory Visit: Payer: Self-pay | Admitting: Internal Medicine

## 2019-12-06 ENCOUNTER — Other Ambulatory Visit: Payer: Self-pay | Admitting: Internal Medicine

## 2020-02-09 ENCOUNTER — Other Ambulatory Visit: Payer: Self-pay | Admitting: Internal Medicine

## 2020-02-19 NOTE — Progress Notes (Signed)
This visit occurred during the SARS-CoV-2 public health emergency.  Safety protocols were in place, including screening questions prior to the visit, additional usage of staff PPE, and extensive cleaning of exam room while observing appropriate contact time as indicated for disinfecting solutions.    Chief Complaint  Patient presents with  . Annual Exam    Doing well    HPI: Patient  DEDRICK Orr  69 y.o. comes in today for Preventive Health Care visit  And med management Since last visit has lost wekt and feeling pretty good   ocass one second light headedness  Orthostatic ? If bp needs  Adjusted.  bp : has been mostly good  Only taking carvedilol 25 in am and not bid  Get  socass light headedness for long standing ? Orthostatic but no syncope  ? If can back off on bp meds  Had been checking ocass 140 range  Not checking pulse . Carvedilol diovan hctz and amlodipine are on list  Only taking potass once a day  Tinnitus  Bilateral for a while high pithed andwife says hearing not the best . No recent loud noises  MOOD ok lexa pro only taking 5 mg per day and seems to be good at that dose HLD taking med  Sees urologist and needs psa level also  Gi taking otc acid releiver and off of the nexium  Doing better since weight loss  Health Maintenance  Topic Date Due  . Hepatitis C Screening  Never done  . FOOT EXAM  Never done  . OPHTHALMOLOGY EXAM  Never done  . COVID-19 Vaccine (1) Never done  . TETANUS/TDAP  10/02/2016  . PNA vac Low Risk Adult (1 of 2 - PCV13) Never done  . HEMOGLOBIN A1C  05/19/2019  . INFLUENZA VACCINE  05/02/2020  . COLONOSCOPY  05/17/2026   Health Maintenance Review LIFESTYLE:  Exercise:  Active at work  Tobacco/ETS: Alcohol: 2+ not as much  Sugar beverages:n Sleep:ok Drug use: no HH of 2 Work: [employer] room 4 hours per day.     ROS:  See above  GEN/ HEENT: No fever, significant weight changes sweats headaches vision problems hearing  changes, CV/ PULM; No chest pain shortness of breath cough, syncope,edema  change in exercise tolerance. GI /GU: No adominal pain, vomiting, change in bowel habits. No blood in the stool. On med for urinary  See urologis SKIN/HEME: ,no acute skin rashes suspicious lesions or bleeding. No lymphadenopathy, nodules, masses.  NEURO/ PSYCH:  No neurologic signs such as weakness numbness. No depression anxiety. IMM/ Allergy: No unusual infections.  Allergy .   REST of 12 system review negative except as per HPI   Past Medical History:  Diagnosis Date  . Anxiety   . Cancer ([place])    skin cancer  . DEPRESSION 12/02/2008  . GERD 12/02/2008    no endo  . Heart murmur    born with  . 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 HYPERGLYCEMIA, MILD 01/19/2010  . HYPERTENSION 12/02/2008  . Hypertension   . IRON DEFICIENCY 07/18/2010   doantes blood  . Muscle fasciculation 11/14/2010  . Muscle twitch 11/14/2010   Left rm  Poss overuse  R/o metabolic    . OBESITY 08/09/2010  . SNORING 07/18/2010    Past Surgical History:  Procedure Laterality Date  . COLONOSCOPY    . KNEE ARTHROSCOPY WITH LATERAL MENISECTOMY Right 04/22/2015   Procedure: KNEE ARTHROSCOPY WITH LATERAL  MENISECTOMY;  Surgeon: Jaime Hitch, MD;  Location: [facility];  Service: Orthopedics;  Laterality: Right;  . KNEE ARTHROSCOPY WITH MEDIAL MENISECTOMY Right 04/22/2015   Procedure: RIGHT KNEE ARTHROSCOPY WITH  CHONDROPLASTY MEDIAL MENISECTOMY;  Surgeon: Jaime Hitch, MD;  Location: [facility];  Service: Orthopedics;  Laterality: Right;  . KNEE CARTILAGE SURGERY  2009   Left Knee   . POLYPECTOMY    . SHOULDER SURGERY  1994   Left  . TONSILLECTOMY  1963  . TONSILLECTOMY      Family History  Problem Relation Age of Onset  . Stroke Mother        78  . Depression Mother   . Hypertension Father   . Heart disease Father        21  . Alcohol abuse Father   . Colon polyps Father   . Sleep apnea  Sister   . Colon cancer Neg Hx   . Esophageal cancer Neg Hx   . Rectal cancer Neg Hx   . Stomach cancer Neg Hx     Social History   Socioeconomic History  . Marital status: Married    Spouse name: Not on file  . Number of children: Not on file  . Years of education: Not on file  . Highest education level: Not on file  Occupational History  . Not on file  Tobacco Use  . Smoking status: Former Research scientist (life sciences)  . Smokeless tobacco: Never Used  Substance and Sexual Activity  . Alcohol use: Yes    Alcohol/week: 14.0 standard drinks    Types: 14 Cans of beer per week    Comment: daily 2-3 beers/day  . Drug use: No  . Sexual activity: Not on file  Other Topics Concern  . 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   Originally from [place]  To [place] of 2 dogs and cats    Donates blood      Social Determinants of Radio broadcast assistant Strain:   . Difficulty of Paying Living Expenses:   Food Insecurity:   . Worried About Charity fundraiser in the Last Year:   . Arboriculturist in the Last Year:   Transportation Needs:   . Film/video editor (Medical):   Marland Kitchen Lack of Transportation (Non-Medical):   Physical Activity:   . Days of Exercise per Week:   . Minutes of Exercise per Session:   Stress:   . Feeling of Stress :   Social Connections:   . Frequency of Communication with Friends and Family:   . Frequency of Social Gatherings with Friends and Family:   . Attends Religious Services:   . Active Member of Clubs or Organizations:   . Attends Archivist Meetings:   Marland Kitchen Marital Status:     Allergies as of 02/20/2020      Reactions   Codeine Other (See Comments)   REACTION: confusion   Wellbutrin [bupropion] Anxiety      Medication List        Accurate as of Feb 20, 2020  1:06 PM. If you have any questions, ask your nurse or doctor.  STOP taking these medications   azithromycin 250 MG tablet Commonly known as: Zithromax Z-Pak Stopped by: Shanon Ace, MD   esomeprazole 20 MG capsule Commonly known as: Darling by: Shanon Ace, MD   HYDROcodone-homatropine 5-1.5 MG/5ML syrup Commonly known as: Hydromet Stopped by: Shanon Ace, MD   meloxicam 15 MG tablet Commonly known as: MOBIC Stopped by: Shanon Ace, MD     TAKE these medications   amLODipine 10 MG tablet Commonly known as: NORVASC Take 1 tablet (10 mg total) by mouth daily. Needs appointment with labs for refills. [phone]   atorvastatin 20 MG tablet Commonly known as: LIPITOR Take 1 tablet (20 mg total) by mouth daily. Needs appointment with labs for refills. [phone]   carvedilol 25 MG tablet Commonly known as: COREG Take 0.5 tablets (12.5 mg total) by mouth daily. Weaning What changed:   how much to take  when to take this  additional instructions Changed by: Shanon Ace, MD   escitalopram 20 MG tablet Commonly known as: LEXAPRO TAKE 1 TABLET BY MOUTH EVERY DAY What changed: how much to take   ketoconazole 2 % cream Commonly known as: NIZORAL Apply 1 application topically 2 (two) times daily.   Klor-Con M20 20 MEQ tablet Generic drug: potassium chloride SA TAKE 1 TABLET BY MOUTH TWICE A DAY   sildenafil 20 MG tablet Commonly known as: REVATIO Take 1 tablet (20 mg total) by mouth 3 (three) times daily.   tamsulosin 0.4 MG Caps capsule Commonly known as: FLOMAX Take 1 capsule (0.4 mg total) by mouth daily. Take 1 capsule daily ( Please contact Urology for further refills if still managing)   triamcinolone cream 0.1 % Commonly known as: KENALOG Apply 1 application topically 2 (two) times daily. As needed for itching   valsartan-hydrochlorothiazide 320-25 MG tablet Commonly known as: DIOVAN-HCT Take 1 tablet  by mouth daily. Needs appointment with labs for refills. [phone]         EXAM:  BP 120/78   Pulse 61   Temp 97.9 F (36.6 C) (Temporal)   Ht 5' 7.75" (1.721 m)   Wt 206 lb (93.4 kg)   SpO2 98%   BMI 31.55 kg/m   Body mass index is 31.55 kg/m. Wt Readings from Last 3 Encounters:  02/20/20 206 lb (93.4 kg)  12/16/18 223 lb 4 oz (101.3 kg)  11/18/18 230 lb 3.2 oz (104.4 kg)    Physical Exam: Vital signs reviewed [id number] is a well-developed well-nourished alert cooperative    who appearsr stated age in no acute distress.  HEENT: normocephalic atraumatic , Eyes: PERRL EOM's full, conjunctiva clear, Nares: paten,t no deformity discharge or tenderness., Ears: no deformity EAC's clear TMs with normal landmarks. Mouth: masked NECK: supple without masses, thyromegaly or bruits. CHEST/PULM:  Clear to auscultation and percussion breath sounds equal no wheeze , rales or rhonchi. No chest wall deformities or tenderness. CV: PMI is nondisplaced, S1 S2 no gallops, murmurs, rubs. Peripheral pulses are full without delay.No JVD .  ABDOMEN: Bowel sounds normal nontender  No guard or rebound, no hepato splenomegal no CVA tenderness.   Extremtities:  No clubbing cyanosis or edema, no acute joint swelling or redness no focal atrophy NEURO:  Oriented x3, cranial nerves 3-12 appear to be intact, no obvious focal weakness,gait within normal limits no abnormal reflexes or asymmetrical SKIN: No acute rashes normal turgor, color, no bruising or petechiae. Sun changes  thickened toe nails no lesions  PSYCH: Oriented, good eye contact, no obvious depression anxiety,  cognition and judgment appear normal. LN: no cervical axillary inguinal adenopathy  Lab Results  Component Value Date   WBC 5.5 11/18/2018   HGB 14.6 11/18/2018   HCT 42.8 11/18/2018   PLT 261.0 11/18/2018   GLUCOSE 104 (H) 11/18/2018   CHOL 130 11/18/2018   TRIG 115.0 11/18/2018   HDL 39.20 11/18/2018   LDLDIRECT 75.0  03/11/2018   LDLCALC 68 11/18/2018   ALT 27 11/18/2018   AST 18 11/18/2018   NA 139 11/18/2018   K 3.9 11/18/2018   CL 100 11/18/2018   CREATININE 0.89 11/18/2018   BUN 18 11/18/2018   CO2 31 11/18/2018   TSH 4.08 11/18/2018   PSA 1.83 11/18/2018   HGBA1C 5.7 11/18/2018   MICROALBUR 1.7 07/08/2010    BP Readings from Last 3 Encounters:  02/20/20 120/78  12/16/18 138/82  11/18/18 130/72  fasting  Today .   EKG sinus brady 52 nl intervals ( he is hours post carvedilol 25 am  Taking qd  Not bid ASSESSMENT AND PLAN:  Discussed the following assessment and plan: Not diabetic   Where did  that come from  In record ?   ICD-10-CM   1. Visit for preventive health examination  Z00.00 PSA    Basic metabolic panel    CBC with Differential/Platelet    Hemoglobin A1c    Hepatic function panel    Lipid panel    TSH    Magnesium  2. Essential hypertension  I10 PSA    Basic metabolic panel    CBC with Differential/Platelet    Hemoglobin A1c    Hepatic function panel    Lipid panel    TSH    Magnesium    EKG 12-Lead  3. Medication management  Z79.899 PSA    Basic metabolic panel    CBC with Differential/Platelet    Hemoglobin A1c    Hepatic function panel    Lipid panel    TSH    Magnesium    EKG 12-Lead  4. Hyperlipidemia, unspecified hyperlipidemia type  E78.5 PSA    Basic metabolic panel    CBC with Differential/Platelet    Hemoglobin A1c    Hepatic function panel    Lipid panel    TSH    Magnesium    EKG 12-Lead  5. Hyperglycemia  R73.9 PSA    Basic metabolic panel    CBC with Differential/Platelet    Hemoglobin A1c    Hepatic function panel    Lipid panel    TSH    Magnesium  6. Tinnitus aurium, bilateral  H93.13 PSA    Basic metabolic panel    CBC with Differential/Platelet    Hemoglobin A1c    Hepatic function panel    Lipid panel    TSH    Magnesium  7. Screening PSA (prostate specific antigen)  Z12.5 PSA  see instr  Check readings and then plan  med deescalation .  Only taking carv qd and 15  barady could be adding  So dec to 12.5 for now and then fu VV in 2-3 mos or as needed  Return for 2-3 months virtual about BP .  Patient Care Team: Norelle Runnion, Standley Brooking, MD as PCP - General Ninetta Lights, MD (Inactive) (Orthopedic Surgery) Inda Castle, MD (Inactive) (Gastroenterology) [facility] dermatology Patient Instructions  advise  See audiology for hearing tinnitus evaluation.  Take blood pressure readings twice a day for 5-7 days 2 readings per sitting  Or if getting  a symptom  Also send in  heart rate  Also    Send in readings  Decrease   Carvedilol to 1/2 once a day  12.5 per day  I agree we may need to decrease medication dosing for BP  In future   Your bp readings are good today . Stay hydrated  Will notify you  of labs when available.   Then plan  Follow up  virtual or in person in 2-3 months .    Health Maintenance, Male Adopting a healthy lifestyle and getting preventive care are important in promoting health and wellness. Ask your health care provider about:  The right schedule for you to have regular tests and exams.  Things you can do on your own to prevent diseases and keep yourself healthy. What should I know about diet, weight, and exercise? Eat a healthy diet   Eat a diet that includes plenty of vegetables, fruits, low-fat dairy products, and lean protein.  Do not eat a lot of foods that are high in solid fats, added sugars, or sodium. Maintain a healthy weight Body mass index (BMI) is a measurement that can be used to identify possible weight problems. It estimates body fat based on height and weight. Your health care provider can help determine your BMI and help you achieve or maintain a healthy weight. Get regular exercise Get regular exercise. This is one of the most important things you can do for your health. Most adults should:  Exercise for at least 150 minutes each week. The exercise should  increase your heart rate and make you sweat (moderate-intensity exercise).  Do strengthening exercises at least twice a week. This is in addition to the moderate-intensity exercise.  Spend less time sitting. Even light physical activity can be beneficial. Watch cholesterol and blood lipids Have your blood tested for lipids and cholesterol at 67 years of age, then have this test every 5 years. You may need to have your cholesterol levels checked more often if:  Your lipid or cholesterol levels are high.  You are older than 67 years of age.  You are at high risk for heart disease. What should I know about cancer screening? Many types of cancers can be detected early and may often be prevented. Depending on your health history and family history, you may need to have cancer screening at various ages. This may include screening for:  Colorectal cancer.  Prostate cancer.  Skin cancer.  Lung cancer. What should I know about heart disease, diabetes, and high blood pressure? Blood pressure and heart disease  High blood pressure causes heart disease and increases the risk of stroke. This is more likely to develop in people who have high blood pressure readings, are of African descent, or are overweight.  Talk with your health care provider about your target blood pressure readings.  Have your blood pressure checked: ? Every 3-5 years if you are 71-83 years of age. ? Every year if you are 76 years old or older.  If you are between the ages of 4 and 17 and are a current or former smoker, ask your health care provider if you should have a one-time screening for abdominal aortic aneurysm (AAA). Diabetes Have regular diabetes screenings. This checks your fasting blood sugar level. Have the screening done:  Once every three years after age 68 if you are at a normal weight and have a low risk for diabetes.  More often and at a younger age if you are  overweight or have a high risk for  diabetes. What should I know about preventing infection? Hepatitis B If you have a higher risk for hepatitis B, you should be screened for this virus. Talk with your health care provider to find out if you are at risk for hepatitis B infection. Hepatitis C Blood testing is recommended for:  Everyone born from [birth year] through 1963.  Anyone with known risk factors for hepatitis C. Sexually transmitted infections (STIs)  You should be screened each year for STIs, including gonorrhea and chlamydia, if: ? You are sexually active and are younger than 66 years of age. ? You are older than 67 years of age and your health care provider tells you that you are at risk for this type of infection. ? Your sexual activity has changed since you were last screened, and you are at increased risk for chlamydia or gonorrhea. Ask your health care provider if you are at risk.  Ask your health care provider about whether you are at high risk for HIV. Your health care provider may recommend a prescription medicine to help prevent HIV infection. If you choose to take medicine to prevent HIV, you should first get tested for HIV. You should then be tested every 3 months for as long as you are taking the medicine. Follow these instructions at home: Lifestyle  Do not use any products that contain nicotine or tobacco, such as cigarettes, e-cigarettes, and chewing tobacco. If you need help quitting, ask your health care provider.  Do not use street drugs.  Do not share needles.  Ask your health care provider for help if you need support or information about quitting drugs. Alcohol use  Do not drink alcohol if your health care provider tells you not to drink.  If you drink alcohol: ? Limit how much you have to 0-2 drinks a day. ? Be aware of how much alcohol is in your drink. In the [place] one drink equals one 12 oz bottle of beer (355 mL), one 5 oz glass of wine (148 mL), or one 1 oz glass of hard liquor (44  mL). General instructions  Schedule regular health, dental, and eye exams.  Stay current with your vaccines.  Tell your health care provider if: ? You often feel depressed. ? You have ever been abused or do not feel safe at home. Summary  Adopting a healthy lifestyle and getting preventive care are important in promoting health and wellness.  Follow your health care provider's instructions about healthy diet, exercising, and getting tested or screened for diseases.  Follow your health care provider's instructions on monitoring your cholesterol and blood pressure. This information is not intended to replace advice given to you by your health care provider. Make sure you discuss any questions you have with your health care provider. Document Revised: 09/11/2018 Document Reviewed: 09/11/2018 Elsevier Patient Education  2020 Meno Armour Villanueva M.Jaime.

## 2020-02-20 ENCOUNTER — Ambulatory Visit (INDEPENDENT_AMBULATORY_CARE_PROVIDER_SITE_OTHER): Payer: Federal, State, Local not specified - PPO | Admitting: Internal Medicine

## 2020-02-20 ENCOUNTER — Encounter: Payer: Self-pay | Admitting: Internal Medicine

## 2020-02-20 ENCOUNTER — Other Ambulatory Visit: Payer: Self-pay

## 2020-02-20 VITALS — BP 120/78 | HR 61 | Temp 97.9°F | Ht 67.75 in | Wt 206.0 lb

## 2020-02-20 DIAGNOSIS — I10 Essential (primary) hypertension: Secondary | ICD-10-CM

## 2020-02-20 DIAGNOSIS — E785 Hyperlipidemia, unspecified: Secondary | ICD-10-CM | POA: Diagnosis not present

## 2020-02-20 DIAGNOSIS — Z125 Encounter for screening for malignant neoplasm of prostate: Secondary | ICD-10-CM | POA: Diagnosis not present

## 2020-02-20 DIAGNOSIS — Z0000 Encounter for general adult medical examination without abnormal findings: Secondary | ICD-10-CM | POA: Diagnosis not present

## 2020-02-20 DIAGNOSIS — H9313 Tinnitus, bilateral: Secondary | ICD-10-CM

## 2020-02-20 DIAGNOSIS — Z79899 Other long term (current) drug therapy: Secondary | ICD-10-CM

## 2020-02-20 DIAGNOSIS — R739 Hyperglycemia, unspecified: Secondary | ICD-10-CM | POA: Diagnosis not present

## 2020-02-20 LAB — MAGNESIUM: Magnesium: 2.1 mg/dL (ref 1.5–2.5)

## 2020-02-20 LAB — CBC WITH DIFFERENTIAL/PLATELET
Basophils Absolute: 0.1 10*3/uL (ref 0.0–0.1)
Basophils Relative: 1.1 % (ref 0.0–3.0)
Eosinophils Absolute: 0.3 10*3/uL (ref 0.0–0.7)
Eosinophils Relative: 6.5 % — ABNORMAL HIGH (ref 0.0–5.0)
HCT: 41.9 % (ref 39.0–52.0)
Hemoglobin: 14.2 g/dL (ref 13.0–17.0)
Lymphocytes Relative: 31 % (ref 12.0–46.0)
Lymphs Abs: 1.5 10*3/uL (ref 0.7–4.0)
MCHC: 33.7 g/dL (ref 30.0–36.0)
MCV: 89.6 fl (ref 78.0–100.0)
Monocytes Absolute: 0.7 10*3/uL (ref 0.1–1.0)
Monocytes Relative: 13.1 % — ABNORMAL HIGH (ref 3.0–12.0)
Neutro Abs: 2.4 10*3/uL (ref 1.4–7.7)
Neutrophils Relative %: 48.3 % (ref 43.0–77.0)
Platelets: 234 10*3/uL (ref 150.0–400.0)
RBC: 4.68 Mil/uL (ref 4.22–5.81)
RDW: 14 % (ref 11.5–15.5)
WBC: 5 10*3/uL (ref 4.0–10.5)

## 2020-02-20 LAB — HEPATIC FUNCTION PANEL
ALT: 16 U/L (ref 0–53)
AST: 14 U/L (ref 0–37)
Albumin: 3.9 g/dL (ref 3.5–5.2)
Alkaline Phosphatase: 52 U/L (ref 39–117)
Bilirubin, Direct: 0.1 mg/dL (ref 0.0–0.3)
Total Bilirubin: 0.5 mg/dL (ref 0.2–1.2)
Total Protein: 6.1 g/dL (ref 6.0–8.3)

## 2020-02-20 LAB — PSA: PSA: 2.12 ng/mL (ref 0.10–4.00)

## 2020-02-20 LAB — BASIC METABOLIC PANEL
BUN: 15 mg/dL (ref 6–23)
CO2: 30 mEq/L (ref 19–32)
Calcium: 9 mg/dL (ref 8.4–10.5)
Chloride: 105 mEq/L (ref 96–112)
Creatinine, Ser: 0.87 mg/dL (ref 0.40–1.50)
GFR: 87.62 mL/min (ref >60.00)
Glucose, Bld: 97 mg/dL (ref 70–99)
Potassium: 3.9 mEq/L (ref 3.5–5.1)
Sodium: 139 mEq/L (ref 135–145)

## 2020-02-20 LAB — LIPID PANEL
Cholesterol: 121 mg/dL (ref 0–200)
HDL: 39.3 mg/dL (ref >39.00)
LDL Cholesterol: 70 mg/dL (ref 0–99)
NonHDL: 82.19
Total CHOL/HDL Ratio: 3
Triglycerides: 63 mg/dL (ref 0.0–149.0)
VLDL: 12.6 mg/dL (ref 0.0–40.0)

## 2020-02-20 LAB — TSH: TSH: 2.35 u[IU]/mL (ref 0.35–4.50)

## 2020-02-20 LAB — HEMOGLOBIN A1C: Hgb A1c MFr Bld: 5.6 % (ref 4.6–6.5)

## 2020-02-20 MED ORDER — CARVEDILOL 25 MG PO TABS: 12.5000 mg | ORAL_TABLET | Freq: Every day | ORAL | 1 refills | Status: DC

## 2020-02-20 NOTE — Patient Instructions (Addendum)
advise  See audiology for hearing tinnitus evaluation.  Take blood pressure readings twice a day for 5-7 days 2 readings per sitting  Or if getting a symptom  Also send in  heart rate  Also    Send in readings  Decrease   Carvedilol to 1/2 once a day  12.5 per day  I agree we may need to decrease medication dosing for BP  In future   Your bp readings are good today . Stay hydrated  Will notify you  of labs when available.   Then plan  Follow up  virtual or in person in 2-3 months .    Health Maintenance, Male Adopting a healthy lifestyle and getting preventive care are important in promoting health and wellness. Ask your health care provider about:  The right schedule for you to have regular tests and exams.  Things you can do on your own to prevent diseases and keep yourself healthy. What should I know about diet, weight, and exercise? Eat a healthy diet   Eat a diet that includes plenty of vegetables, fruits, low-fat dairy products, and lean protein.  Do not eat a lot of foods that are high in solid fats, added sugars, or sodium. Maintain a healthy weight Body mass index (BMI) is a measurement that can be used to identify possible weight problems. It estimates body fat based on height and weight. Your health care provider can help determine your BMI and help you achieve or maintain a healthy weight. Get regular exercise Get regular exercise. This is one of the most important things you can do for your health. Most adults should:  Exercise for at least 150 minutes each week. The exercise should increase your heart rate and make you sweat (moderate-intensity exercise).  Do strengthening exercises at least twice a week. This is in addition to the moderate-intensity exercise.  Spend less time sitting. Even light physical activity can be beneficial. Watch cholesterol and blood lipids Have your blood tested for lipids and cholesterol at 67 years of age, then have this test  every 5 years. You may need to have your cholesterol levels checked more often if:  Your lipid or cholesterol levels are high.  You are older than 67 years of age.  You are at high risk for heart disease. What should I know about cancer screening? Many types of cancers can be detected early and may often be prevented. Depending on your health history and family history, you may need to have cancer screening at various ages. This may include screening for:  Colorectal cancer.  Prostate cancer.  Skin cancer.  Lung cancer. What should I know about heart disease, diabetes, and high blood pressure? Blood pressure and heart disease  High blood pressure causes heart disease and increases the risk of stroke. This is more likely to develop in people who have high blood pressure readings, are of African descent, or are overweight.  Talk with your health care provider about your target blood pressure readings.  Have your blood pressure checked: ? Every 3-5 years if you are 39-49 years of age. ? Every year if you are 92 years old or older.  If you are between the ages of 8 and 66 and are a current or former smoker, ask your health care provider if you should have a one-time screening for abdominal aortic aneurysm (AAA). Diabetes Have regular diabetes screenings. This checks your fasting blood sugar level. Have the screening done:  Once every three years after  age 54 if you are at a normal weight and have a low risk for diabetes.  More often and at a younger age if you are overweight or have a high risk for diabetes. What should I know about preventing infection? Hepatitis B If you have a higher risk for hepatitis B, you should be screened for this virus. Talk with your health care provider to find out if you are at risk for hepatitis B infection. Hepatitis C Blood testing is recommended for:  Everyone born from [birth year] through 1963.  Anyone with known risk factors for hepatitis  C. Sexually transmitted infections (STIs)  You should be screened each year for STIs, including gonorrhea and chlamydia, if: ? You are sexually active and are younger than 67 years of age. ? You are older than 67 years of age and your health care provider tells you that you are at risk for this type of infection. ? Your sexual activity has changed since you were last screened, and you are at increased risk for chlamydia or gonorrhea. Ask your health care provider if you are at risk.  Ask your health care provider about whether you are at high risk for HIV. Your health care provider may recommend a prescription medicine to help prevent HIV infection. If you choose to take medicine to prevent HIV, you should first get tested for HIV. You should then be tested every 3 months for as long as you are taking the medicine. Follow these instructions at home: Lifestyle  Do not use any products that contain nicotine or tobacco, such as cigarettes, e-cigarettes, and chewing tobacco. If you need help quitting, ask your health care provider.  Do not use street drugs.  Do not share needles.  Ask your health care provider for help if you need support or information about quitting drugs. Alcohol use  Do not drink alcohol if your health care provider tells you not to drink.  If you drink alcohol: ? Limit how much you have to 0-2 drinks a day. ? Be aware of how much alcohol is in your drink. In the [place] one drink equals one 12 oz bottle of beer (355 mL), one 5 oz glass of wine (148 mL), or one 1 oz glass of hard liquor (44 mL). General instructions  Schedule regular health, dental, and eye exams.  Stay current with your vaccines.  Tell your health care provider if: ? You often feel depressed. ? You have ever been abused or do not feel safe at home. Summary  Adopting a healthy lifestyle and getting preventive care are important in promoting health and wellness.  Follow your health care  provider's instructions about healthy diet, exercising, and getting tested or screened for diseases.  Follow your health care provider's instructions on monitoring your cholesterol and blood pressure. This information is not intended to replace advice given to you by your health care provider. Make sure you discuss any questions you have with your health care provider. Document Revised: 09/11/2018 Document Reviewed: 09/11/2018 Elsevier Patient Education  2020 [employer].

## 2020-02-20 NOTE — Progress Notes (Signed)
Blood results are good ..including  sugar and cholesterol  Keep up the good effort.    psa is in 2 range  Forwarding results to urology office

## 2020-03-04 ENCOUNTER — Other Ambulatory Visit: Payer: Self-pay

## 2020-03-04 ENCOUNTER — Other Ambulatory Visit: Payer: Self-pay | Admitting: Internal Medicine

## 2020-03-05 MED ORDER — POTASSIUM CHLORIDE CRYS ER 20 MEQ PO TBCR: 20.0000 meq | EXTENDED_RELEASE_TABLET | Freq: Every day | ORAL | 0 refills | Status: DC

## 2020-03-05 NOTE — Telephone Encounter (Signed)
Routing to correct PCP CMA

## 2020-05-28 ENCOUNTER — Other Ambulatory Visit: Payer: Self-pay | Admitting: Internal Medicine

## 2020-06-05 ENCOUNTER — Other Ambulatory Visit: Payer: Self-pay | Admitting: Internal Medicine

## 2020-07-05 NOTE — Telephone Encounter (Signed)
Tough injury  It would be best to ask the  Provider that saw you for the  injury and see if they will sign or a temporary handicapped .  I have no records and  I would need a visit to address  Any forms for the problem

## 2020-07-19 ENCOUNTER — Other Ambulatory Visit: Payer: Self-pay

## 2020-07-19 MED ORDER — ESCITALOPRAM OXALATE 20 MG PO TABS
20.0000 mg | ORAL_TABLET | Freq: Every day | ORAL | 0 refills | Status: DC
Start: 2020-07-19 — End: 2020-10-11

## 2020-07-19 MED ORDER — ATORVASTATIN CALCIUM 20 MG PO TABS
20.0000 mg | ORAL_TABLET | Freq: Every day | ORAL | 0 refills | Status: DC
Start: 2020-07-19 — End: 2020-11-01

## 2020-08-23 ENCOUNTER — Other Ambulatory Visit: Payer: Self-pay | Admitting: Internal Medicine

## 2020-09-01 ENCOUNTER — Other Ambulatory Visit: Payer: Self-pay | Admitting: Internal Medicine

## 2020-10-11 ENCOUNTER — Other Ambulatory Visit: Payer: Self-pay | Admitting: Internal Medicine

## 2020-11-01 ENCOUNTER — Other Ambulatory Visit: Payer: Self-pay | Admitting: Internal Medicine

## 2020-11-15 ENCOUNTER — Other Ambulatory Visit: Payer: Self-pay | Admitting: Internal Medicine

## 2020-11-23 ENCOUNTER — Other Ambulatory Visit: Payer: Self-pay | Admitting: Internal Medicine

## 2021-01-05 ENCOUNTER — Other Ambulatory Visit: Payer: Self-pay | Admitting: Internal Medicine

## 2021-01-07 ENCOUNTER — Telehealth: Payer: Self-pay | Admitting: Internal Medicine

## 2021-01-07 NOTE — Telephone Encounter (Signed)
Form has been placed in red folder.  

## 2021-01-07 NOTE — Telephone Encounter (Signed)
Patient dropped off forms to be completed by Dr. Regis Bill. Forms placed in folder.  Patient works at [employer] and would like forms to be sent to him in inter-office envelope with attention to [employer].  Please advise.

## 2021-01-10 ENCOUNTER — Encounter: Payer: Self-pay | Admitting: Family Medicine

## 2021-01-10 ENCOUNTER — Telehealth: Payer: Federal, State, Local not specified - PPO | Admitting: Family Medicine

## 2021-01-10 VITALS — Temp 98.0°F | Wt 206.0 lb

## 2021-01-10 DIAGNOSIS — J40 Bronchitis, not specified as acute or chronic: Secondary | ICD-10-CM | POA: Diagnosis not present

## 2021-01-10 MED ORDER — AZITHROMYCIN 250 MG PO TABS: ORAL_TABLET | ORAL | 0 refills | Status: DC

## 2021-01-10 MED ORDER — HYDROCODONE-HOMATROPINE 5-1.5 MG/5ML PO SYRP: 5.0000 mL | ORAL_SOLUTION | ORAL | 0 refills | Status: DC | PRN

## 2021-01-10 MED ORDER — BENZONATATE 200 MG PO CAPS: 200.0000 mg | ORAL_CAPSULE | Freq: Four times a day (QID) | ORAL | 0 refills | Status: DC | PRN

## 2021-01-10 NOTE — Progress Notes (Signed)
Subjective:    Patient ID: Jaime Orr, male    DOB: [birth year], 70 y.o.   MRN: [id number]  HPI Virtual Visit via Video Note  I connected with the patient on 01/10/21 at 10:45 AM EDT by a video enabled telemedicine application and verified that I am speaking with the correct person using two identifiers.  Location patient: home Location provider:work or home office Persons participating in the virtual visit: patient, provider  I discussed the limitations of evaluation and management by telemedicine and the availability of in person appointments. The patient expressed understanding and agreed to proceed.   HPI: Here for one week of a hard dry cough, along with some headache and PND and ST. No fever or body aches. No SOB or chest pain. No NVD. Drinking fluids and taking Allegra. He tested negative for the Covid virus at home 2 days ago.    ROS: See pertinent positives and negatives per HPI.  Past Medical History:  Diagnosis Date  . Anxiety   . Cancer ([place])    skin cancer  . DEPRESSION 12/02/2008  . GERD 12/02/2008    no endo  . Heart murmur    born with  . 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 HYPERGLYCEMIA, MILD 01/19/2010  . HYPERTENSION 12/02/2008  . Hypertension   . IRON DEFICIENCY 07/18/2010   doantes blood  . Muscle fasciculation 11/14/2010  . Muscle twitch 11/14/2010   Left rm  Poss overuse  R/o metabolic    . OBESITY 08/09/2010  . SNORING 07/18/2010    Past Surgical History:  Procedure Laterality Date  . COLONOSCOPY    . KNEE ARTHROSCOPY WITH LATERAL MENISECTOMY Right 04/22/2015   Procedure: KNEE ARTHROSCOPY WITH LATERAL MENISECTOMY;  Surgeon: Kathryne Hitch, MD;  Location: [place];  Service: Orthopedics;  Laterality: Right;  . KNEE ARTHROSCOPY WITH MEDIAL MENISECTOMY Right 04/22/2015   Procedure: RIGHT KNEE ARTHROSCOPY WITH  CHONDROPLASTY MEDIAL MENISECTOMY;  Surgeon: Kathryne Hitch, MD;  Location: [facility];  Service:  Orthopedics;  Laterality: Right;  . KNEE CARTILAGE SURGERY  2009   Left Knee   . POLYPECTOMY    . SHOULDER SURGERY  1994   Left  . TONSILLECTOMY  1963  . TONSILLECTOMY      Family History  Problem Relation Age of Onset  . Stroke Mother        63  . Depression Mother   . Hypertension Father   . Heart disease Father        97  . Alcohol abuse Father   . Colon polyps Father   . Sleep apnea Sister   . Colon cancer Neg Hx   . Esophageal cancer Neg Hx   . Rectal cancer Neg Hx   . Stomach cancer Neg Hx      Current Outpatient Medications:  .  amLODipine (NORVASC) 10 MG tablet, TAKE 1 TABLET BY MOUTH EVERY DAY, Disp: 90 tablet, Rfl: 0 .  atorvastatin (LIPITOR) 20 MG tablet, TAKE 1 TABLET BY MOUTH EVERY DAY, Disp: 90 tablet, Rfl: 0 .  azithromycin (ZITHROMAX Z-PAK) 250 MG tablet, As directed, Disp: 6 each, Rfl: 0 .  benzonatate (TESSALON) 200 MG capsule, Take 1 capsule (200 mg total) by mouth every 6 (six) hours as needed for cough., Disp: 60 capsule, Rfl: 0 .  carvedilol (COREG) 25 MG tablet, Take 0.5 tablets (12.5 mg total) by mouth daily. Weaning, Disp: 90 tablet, Rfl: 1 .  escitalopram (  LEXAPRO) 20 MG tablet, TAKE 1 TABLET BY MOUTH EVERY DAY, Disp: 90 tablet, Rfl: 0 .  HYDROcodone-homatropine (HYCODAN) 5-1.5 MG/5ML syrup, Take 5 mLs by mouth every 4 (four) hours as needed for cough., Disp: 240 mL, Rfl: 0 .  ketoconazole (NIZORAL) 2 % cream, Apply 1 application topically 2 (two) times daily., Disp: 30 g, Rfl: 1 .  KLOR-CON M20 20 MEQ tablet, TAKE 1 TABLET BY MOUTH EVERY DAY, Disp: 90 tablet, Rfl: 0 .  sildenafil (REVATIO) 20 MG tablet, Take 1 tablet (20 mg total) by mouth 3 (three) times daily., Disp: 90 tablet, Rfl: 0 .  tamsulosin (FLOMAX) 0.4 MG CAPS capsule, Take 1 capsule (0.4 mg total) by mouth daily. Take 1 capsule daily ( Please contact Urology for further refills if still managing), Disp: 30 capsule, Rfl: 0 .  triamcinolone cream (KENALOG) 0.1 %, Apply 1 application  topically 2 (two) times daily. As needed for itching, Disp: 30 g, Rfl: 0 .  valsartan-hydrochlorothiazide (DIOVAN-HCT) 320-25 MG tablet, TAKE 1 TABLET BY MOUTH EVERY DAY, Disp: 90 tablet, Rfl: 0  EXAM:  VITALS per patient if applicable:  GENERAL: alert, oriented, appears well and in no acute distress  HEENT: atraumatic, conjunttiva clear, no obvious abnormalities on inspection of external nose and ears  NECK: normal movements of the head and neck  LUNGS: on inspection no signs of respiratory distress, breathing rate appears normal, no obvious gross SOB, gasping or wheezing  CV: no obvious cyanosis  MS: moves all visible extremities without noticeable abnormality  PSYCH/NEURO: pleasant and cooperative, no obvious depression or anxiety, speech and thought processing grossly intact  ASSESSMENT AND PLAN: Bronchitis, treat with a Zpack. Add Hydromet and Benzonatate as needed.  Alysia Penna, MD  Discussed the following assessment and plan:  No diagnosis found.     I discussed the assessment and treatment plan with the patient. The patient was provided an opportunity to ask questions and all were answered. The patient agreed with the plan and demonstrated an understanding of the instructions.   The patient was advised to call back or seek an in-person evaluation if the symptoms worsen or if the condition fails to improve as anticipated.     Review of Systems     Objective:   Physical Exam        Assessment & Plan:

## 2021-01-10 NOTE — Telephone Encounter (Signed)
I spoke with the patient and he declined to schedule an in person office visit. Patient stated that "Dr. Regis Bill came up short on this one". Patient stated that he would call back to schedule an office visit at a later time.

## 2021-01-10 NOTE — Telephone Encounter (Signed)
I do not have enough information to fill out the forms # 1 blood pressure monitor: I do not have his last blood pressure and the size of his arm.  That is requested  #2 handicap drivers license from an injury a year ago per Raliegh Ip.  Not an indication for handicap l accommodation sticker unless orthopedics wants to find the form   Needs in person visit to address above.

## 2021-01-11 NOTE — Telephone Encounter (Signed)
He can measure his arm and fill out the rest of the form for the blood pressure machine signed a blood pressure form on your desk.

## 2021-01-11 NOTE — Telephone Encounter (Signed)
I will sign the blood pressure form and see if it goes through.

## 2021-01-12 NOTE — Telephone Encounter (Signed)
Patient informed of the message below and that his forms were placed at the front office and he can pick them up at his earliest convenience.

## 2021-01-12 NOTE — Telephone Encounter (Signed)
Left a message for the patient to return my call.  

## 2021-02-17 ENCOUNTER — Other Ambulatory Visit: Payer: Self-pay | Admitting: Internal Medicine

## 2021-02-20 ENCOUNTER — Other Ambulatory Visit: Payer: Self-pay | Admitting: Internal Medicine

## 2021-03-15 ENCOUNTER — Other Ambulatory Visit: Payer: Self-pay | Admitting: Internal Medicine

## 2021-03-17 ENCOUNTER — Other Ambulatory Visit: Payer: Self-pay | Admitting: Internal Medicine

## 2021-03-22 ENCOUNTER — Encounter: Payer: Federal, State, Local not specified - PPO | Admitting: Internal Medicine

## 2021-04-09 ENCOUNTER — Other Ambulatory Visit: Payer: Self-pay | Admitting: Internal Medicine

## 2021-04-12 ENCOUNTER — Other Ambulatory Visit: Payer: Self-pay

## 2021-04-12 NOTE — Progress Notes (Signed)
Chief Complaint  Patient presents with   Annual Exam     HPI: Patient  Jaime Orr  70 y.o. comes in today for Preventive Health Care visit   Doing ok  .  Little anxiety after daughter predicament.  2 weeks ago .  Ask about alprazolam still drinks etoh regularly   BP  about  same  135 sometimes  vs 150.  Taking meds as reported  good adherence   Wears hearing aids now   Under eval and follow right thigh  mass  Sees  urology  check psa please Had a very large rx to sting  LE and then what sounds like Hives alll over   no sob or syncope of  gi sx .  Health Maintenance  Topic Date Due   FOOT EXAM  Never done   OPHTHALMOLOGY EXAM  Never done   Hepatitis C Screening  Never done   TETANUS/TDAP  10/02/2016   PNA vac Low Risk Adult (1 of 2 - PCV13) 06/26/2018   COVID-19 Vaccine (4 - Booster for Pfizer series) 10/21/2020   INFLUENZA VACCINE  05/02/2021   Zoster Vaccines- Shingrix (2 of 2) 06/08/2021   HEMOGLOBIN A1C  10/14/2021   COLONOSCOPY (Pts 45-69yrs Insurance coverage will need to be confirmed)  05/17/2026   HPV VACCINES  Aged Out   Health Maintenance Review LIFESTYLE:  Exercise:  physical job   Tobacco/ETS: n Alcohol:  daily 3 Sugar beverages: Sleep:   8 hours  Drug use: no HH of 2  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 GI /GU: No adominal pain, vomiting, change in bowel habits. No blood in the stool. No significant GU symptoms. SKIN/HEME: ,no acute skin rashes suspicious lesions or bleeding. No lymphadenopathy, nodules, masses.  NEURO/ PSYCH:  No neurologic signs such as weakness numbness. No depression anxiety. IMM/ Allergy: No unusual infections.  Allergy .   REST of 12 system review negative except as per HPI   Past Medical History:  Diagnosis  Date   Anxiety    Cancer ([place])    skin cancer   DEPRESSION 12/02/2008   GERD 12/02/2008    no endo   Heart murmur    born with   Horseshoe kidney    simple cyst on ct  [place] 2010   HYPERGLYCEMIA, MILD 01/19/2010   HYPERTENSION 12/02/2008   Hypertension    IRON DEFICIENCY 07/18/2010   doantes blood   Muscle fasciculation 11/14/2010   Muscle twitch 11/14/2010   Left rm  Poss overuse  R/o metabolic     OBESITY 16/10/958   SNORING 07/18/2010    Past Surgical History:  Procedure Laterality Date   COLONOSCOPY     KNEE ARTHROSCOPY WITH LATERAL MENISECTOMY Right 04/22/2015   Procedure: KNEE ARTHROSCOPY WITH LATERAL MENISECTOMY;  Surgeon: Kathryne Hitch, MD;  Location: [place];  Service: Orthopedics;  Laterality: Right;   KNEE ARTHROSCOPY WITH MEDIAL MENISECTOMY Right 04/22/2015   Procedure: RIGHT KNEE ARTHROSCOPY WITH  CHONDROPLASTY MEDIAL MENISECTOMY;  Surgeon: Kathryne Hitch, MD;  Location: [place];  Service: Orthopedics;  Laterality: Right;   KNEE CARTILAGE SURGERY  2009   Left Knee    POLYPECTOMY     Paint  Family History  Problem Relation Age of Onset   Stroke Mother        84   Depression Mother    Hypertension Father    Heart disease Father        13   Alcohol abuse Father    Colon polyps Father    Sleep apnea Sister    Colon cancer Neg Hx    Esophageal cancer Neg Hx    Rectal cancer Neg Hx    Stomach cancer Neg Hx     Social History   Socioeconomic History   Marital status: Married    Spouse name: Not on file   Number of children: Not on file   Years of education: Not on file   Highest education level: Not on file  Occupational History   Not on file  Tobacco Use   Smoking status: Former   Smokeless tobacco: Never  Vaping Use   Vaping Use: Never used  Substance and Sexual Activity   Alcohol use: Yes    Alcohol/week: 14.0 standard drinks    Types: 14 Cans of  beer per week    Comment: daily 2-3 beers/day   Drug use: No   Sexual activity: Not on file  Other Topics Concern   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   Originally from [place]  To [place] of 2 dogs and cats    Donates blood      Social Determinants of Radio broadcast assistant Strain: Not on file  Food Insecurity: Not on file  Transportation Needs: Not on file  Physical Activity: Not on file  Stress: Not on file  Social Connections: Not on file    Outpatient Medications Prior to Visit  Medication Sig Dispense Refill   amLODipine (NORVASC) 10 MG tablet TAKE 1 TABLET BY MOUTH EVERY DAY 30 tablet 0   atorvastatin (LIPITOR) 20 MG tablet TAKE 1 TABLET BY MOUTH EVERY DAY 90 tablet 0   carvedilol (COREG) 25 MG tablet Take 0.5 tablets (12.5 mg total) by mouth daily. Weaning 90 tablet 1   escitalopram (LEXAPRO) 20 MG tablet TAKE 1 TABLET BY MOUTH EVERY DAY 90 tablet 0   ketoconazole (NIZORAL) 2 % cream Apply 1 application topically 2 (two) times daily. 30 g 1   KLOR-CON M20 20 MEQ tablet TAKE 1 TABLET BY MOUTH EVERY DAY 30 tablet 0   sildenafil (REVATIO) 20 MG tablet Take 1 tablet (20 mg total) by mouth 3 (three) times daily. 90 tablet 0   tamsulosin (FLOMAX) 0.4 MG CAPS capsule Take 1 capsule (0.4 mg total) by mouth daily. Take 1 capsule daily ( Please contact Urology for further refills if still managing) 30 capsule 0   triamcinolone cream (KENALOG) 0.1 % Apply 1 application topically 2 (two) times daily. As needed for itching 30 g 0   valsartan-hydrochlorothiazide (DIOVAN-HCT) 320-25 MG tablet TAKE 1 TABLET BY MOUTH EVERY DAY 30 tablet 0   azithromycin (ZITHROMAX Z-PAK) 250 MG tablet As directed 6 each 0   benzonatate (TESSALON) 200 MG capsule Take 1 capsule  (200 mg total) by mouth every 6 (six) hours as needed for cough. 60 capsule 0   HYDROcodone-homatropine (HYCODAN) 5-1.5 MG/5ML  syrup Take 5 mLs by mouth every 4 (four) hours as needed for cough. 240 mL 0   No facility-administered medications prior to visit.     EXAM:  BP (!) 148/80 (BP Location: Left Arm, Patient Position: Sitting, Cuff Size: Normal)   Pulse 80   Temp 97.9 F (36.6 C) (Oral)   Ht 5\' 7"  (1.702 m)   Wt 212 lb 6.4 oz (96.3 kg)   SpO2 96%   BMI 33.27 kg/m   Body mass index is 33.27 kg/m. Wt Readings from Last 3 Encounters:  04/13/21 212 lb 6.4 oz (96.3 kg)  01/10/21 206 lb (93.4 kg)  02/20/20 206 lb (93.4 kg)    Physical Exam: Vital signs reviewed RJJ:OACZ is a well-developed well-nourished alert cooperative    who appearsr stated age in no acute distress.  HEENT: normocephalic atraumatic , Eyes: PERRL EOM's full, conjunctiva clear, Nares: paten,t no deformity discharge or tenderness., Ears: no deformity EAC's clear TMs with normal landmarks. Mouth: masked  NECK: supple without masses, thyromegaly or bruits. CHEST/PULM:  Clear to auscultation and percussion breath sounds equal no wheeze , rales or rhonchi. No chest wall deformities or tenderness. CV: PMI is nondisplaced, S1 S2 no gallops, murmurs, rubs. Peripheral pulses are full without delay.No JVD .  ABDOMEN: Bowel sounds normal nontender  No guard or rebound, no hepato splenomegal no CVA tenderness.  No hernia. Extremtities:  No clubbing cyanosis or edema, no acute joint swelling or redness no focal atrophy right thigh upper mass lipoma?  NEURO:  Oriented x3, cranial nerves 3-12 appear to be intact, no obvious focal weakness,gait within normal limits no abnormal reflexes or asymmetrical SKIN: No acute rashes normal turgor, color, no bruising or petechiae. PSYCH: Oriented, good eye contact, no obvious depression anxiety, cognition and judgment appear normal. LN: no cervical axillary inguinal adenopathy  Lab  Results  Component Value Date   WBC 5.0 04/13/2021   HGB 13.9 04/13/2021   HCT 40.6 04/13/2021   PLT 247.0 04/13/2021   GLUCOSE 105 (H) 04/13/2021   CHOL 133 04/13/2021   TRIG 82.0 04/13/2021   HDL 45.30 04/13/2021   LDLDIRECT 75.0 03/11/2018   LDLCALC 71 04/13/2021   ALT 21 04/13/2021   AST 18 04/13/2021   NA 140 04/13/2021   K 3.6 04/13/2021   CL 104 04/13/2021   CREATININE 0.83 04/13/2021   BUN 16 04/13/2021   CO2 28 04/13/2021   TSH 3.21 04/13/2021   PSA 2.45 04/13/2021   HGBA1C 5.7 04/13/2021   MICROALBUR 1.7 07/08/2010    BP Readings from Last 3 Encounters:  04/13/21 (!) 148/80  02/20/20 120/78  12/16/18 138/82    Labplan  reviewed with patient   ASSESSMENT AND PLAN:  Discussed the following assessment and plan:    ICD-10-CM   1. Visit for preventive health examination  Z00.00 PSA    PSA    2. Essential hypertension  Y60 Basic metabolic panel    CBC with Differential/Platelet    Hemoglobin A1c    Hepatic function panel    Lipid panel    TSH    TSH    Lipid panel    Hepatic function panel    Hemoglobin A1c    CBC with Differential/Platelet    Basic metabolic panel    3. Medication management  Y30.160 Basic metabolic panel    CBC with Differential/Platelet    Hemoglobin A1c    Hepatic function panel    Lipid panel    TSH  TSH    Lipid panel    Hepatic function panel    Hemoglobin A1c    CBC with Differential/Platelet    Basic metabolic panel    4. Hyperlipidemia, unspecified hyperlipidemia type  H08.6 Basic metabolic panel    CBC with Differential/Platelet    Hemoglobin A1c    Hepatic function panel    Lipid panel    TSH    TSH    Lipid panel    Hepatic function panel    Hemoglobin A1c    CBC with Differential/Platelet    Basic metabolic panel    5. Hyperglycemia  V78.4 Basic metabolic panel    CBC with Differential/Platelet    Hemoglobin A1c    Hepatic function panel    Lipid panel    TSH    TSH    Lipid panel    Hepatic  function panel    Hemoglobin A1c    CBC with Differential/Platelet    Basic metabolic panel    6. Screening PSA (prostate specific antigen)  Z12.5 PSA    PSA    7. Need for pneumococcal vaccination  Z23 Pneumococcal conjugate vaccine 20-valent (Prevnar 20)    8. Need for shingles vaccine  Z23 Varicella-zoster vaccine IM    Borderline bp control  poss  etoh aggravated .  Anxiety   disc  prefer to not use  benzo with the etoh  will inc lexapro  20 and go from there  in regard to anxiety  Lab today  Disc poss  allergy to insect sting  emergent care  other consider see allergist.   Return for depending on results and blood pressure.  Patient Care Team: Vilda Zollner, Standley Brooking, MD as PCP - General Ninetta Lights, MD (Inactive) (Orthopedic Surgery) Inda Castle, MD (Inactive) (Gastroenterology) [facility] Patient Instructions  Blood pressure goal is average of 130/80 or similar. Check blood pressure readings twice a day for 3 to 5 days at least 2 readings per setting Send readings in by MyChart.  We may want to adjust your blood pressure medicine .  ( Poss inc carvedilol to 25 mg twice a day)  Low sodium diet  and limit etoh can help the BP also  5# weight loss will help also .   Increase Lexapro escitalopram to 20 mg temporarily because of the anxiety Limit alcohol which will be adding to the anxiety.  We do not want to use Xanax with alcohol. Please carry easily absorbed Benadryl with you in case you get another staying.  If any systemic symptoms be seen in the emergency room and consider allergy evaluation.  Pneumonia vaccine today You may want to get shingles vaccine but can get side effects of a very sore arm and fever chills the next day. You can get this as a shot only appointment with [place] or at a pharmacy.[facility] pharmacy does these also.)     Plan fu depending  on BP and labs    Health Maintenance, Male Adopting a healthy lifestyle and getting preventive care  are important in promoting health and wellness. Ask your health care provider about: The right schedule for you to have regular tests and exams. Things you can do on your own to prevent diseases and keep yourself healthy. What should I know about diet, weight, and exercise? Eat a healthy diet  Eat a diet that includes plenty of vegetables, fruits, low-fat dairy products, and lean protein. Do not eat a lot of foods that  are high in solid fats, added sugars, or sodium.  Maintain a healthy weight Body mass index (BMI) is a measurement that can be used to identify possible weight problems. It estimates body fat based on height and weight. Your health care provider can help determine your BMI and help you achieve or maintain ahealthy weight. Get regular exercise Get regular exercise. This is one of the most important things you can do for your health. Most adults should: Exercise for at least 150 minutes each week. The exercise should increase your heart rate and make you sweat (moderate-intensity exercise). Do strengthening exercises at least twice a week. This is in addition to the moderate-intensity exercise. Spend less time sitting. Even light physical activity can be beneficial. Watch cholesterol and blood lipids Have your blood tested for lipids and cholesterol at 68 years of age, then havethis test every 5 years. You may need to have your cholesterol levels checked more often if: Your lipid or cholesterol levels are high. You are older than 68 years of age. You are at high risk for heart disease. What should I know about cancer screening? Many types of cancers can be detected early and may often be prevented. Depending on your health history and family history, you may need to have cancer screening at various ages. This may include screening for: Colorectal cancer. Prostate cancer. Skin cancer. Lung cancer. What should I know about heart disease, diabetes, and high blood  pressure? Blood pressure and heart disease High blood pressure causes heart disease and increases the risk of stroke. This is more likely to develop in people who have high blood pressure readings, are of African descent, or are overweight. Talk with your health care provider about your target blood pressure readings. Have your blood pressure checked: Every 3-5 years if you are 60-75 years of age. Every year if you are 58 years old or older. If you are between the ages of 79 and 61 and are a current or former smoker, ask your health care provider if you should have a one-time screening for abdominal aortic aneurysm (AAA). Diabetes Have regular diabetes screenings. This checks your fasting blood sugar level. Have the screening done: Once every three years after age 60 if you are at a normal weight and have a low risk for diabetes. More often and at a younger age if you are overweight or have a high risk for diabetes. What should I know about preventing infection? Hepatitis B If you have a higher risk for hepatitis B, you should be screened for this virus. Talk with your health care provider to find out if you are at risk forhepatitis B infection. Hepatitis C Blood testing is recommended for: Everyone born from [birth year] through 1965. Anyone with known risk factors for hepatitis C. Sexually transmitted infections (STIs) You should be screened each year for STIs, including gonorrhea and chlamydia, if: You are sexually active and are younger than 68 years of age. You are older than 68 years of age and your health care provider tells you that you are at risk for this type of infection. Your sexual activity has changed since you were last screened, and you are at increased risk for chlamydia or gonorrhea. Ask your health care provider if you are at risk. Ask your health care provider about whether you are at high risk for HIV. Your health care provider may recommend a prescription medicine to help  prevent HIV infection. If you choose to take medicine to prevent HIV, you  should first get tested for HIV. You should then be tested every 3 months for as long as you are taking the medicine. Follow these instructions at home: Lifestyle Do not use any products that contain nicotine or tobacco, such as cigarettes, e-cigarettes, and chewing tobacco. If you need help quitting, ask your health care provider. Do not use street drugs. Do not share needles. Ask your health care provider for help if you need support or information about quitting drugs. Alcohol use Do not drink alcohol if your health care provider tells you not to drink. If you drink alcohol: Limit how much you have to 0-2 drinks a day. Be aware of how much alcohol is in your drink. In the [place] one drink equals one 12 oz bottle of beer (355 mL), one 5 oz glass of wine (148 mL), or one 1 oz glass of hard liquor (44 mL). General instructions Schedule regular health, dental, and eye exams. Stay current with your vaccines. Tell your health care provider if: You often feel depressed. You have ever been abused or do not feel safe at home. Summary Adopting a healthy lifestyle and getting preventive care are important in promoting health and wellness. Follow your health care provider's instructions about healthy diet, exercising, and getting tested or screened for diseases. Follow your health care provider's instructions on monitoring your cholesterol and blood pressure. This information is not intended to replace advice given to you by your health care provider. Make sure you discuss any questions you have with your healthcare provider. Document Revised: 09/11/2018 Document Reviewed: 09/11/2018 Elsevier Patient Education  2022 [place]. Khamryn Calderone M.D.

## 2021-04-13 ENCOUNTER — Encounter: Payer: Self-pay | Admitting: Internal Medicine

## 2021-04-13 ENCOUNTER — Ambulatory Visit (INDEPENDENT_AMBULATORY_CARE_PROVIDER_SITE_OTHER): Payer: Federal, State, Local not specified - PPO | Admitting: Internal Medicine

## 2021-04-13 VITALS — BP 148/80 | HR 80 | Temp 97.9°F | Ht 67.0 in | Wt 212.4 lb

## 2021-04-13 DIAGNOSIS — Z125 Encounter for screening for malignant neoplasm of prostate: Secondary | ICD-10-CM

## 2021-04-13 DIAGNOSIS — E785 Hyperlipidemia, unspecified: Secondary | ICD-10-CM | POA: Diagnosis not present

## 2021-04-13 DIAGNOSIS — Z79899 Other long term (current) drug therapy: Secondary | ICD-10-CM

## 2021-04-13 DIAGNOSIS — I10 Essential (primary) hypertension: Secondary | ICD-10-CM | POA: Diagnosis not present

## 2021-04-13 DIAGNOSIS — R739 Hyperglycemia, unspecified: Secondary | ICD-10-CM

## 2021-04-13 DIAGNOSIS — Z23 Encounter for immunization: Secondary | ICD-10-CM

## 2021-04-13 DIAGNOSIS — Z0000 Encounter for general adult medical examination without abnormal findings: Secondary | ICD-10-CM

## 2021-04-13 LAB — BASIC METABOLIC PANEL
BUN: 16 mg/dL (ref 6–23)
CO2: 28 mEq/L (ref 19–32)
Calcium: 8.9 mg/dL (ref 8.4–10.5)
Chloride: 104 mEq/L (ref 96–112)
Creatinine, Ser: 0.83 mg/dL (ref 0.40–1.50)
GFR: 90.38 mL/min (ref >60.00)
Glucose, Bld: 105 mg/dL — ABNORMAL HIGH (ref 70–99)
Potassium: 3.6 mEq/L (ref 3.5–5.1)
Sodium: 140 mEq/L (ref 135–145)

## 2021-04-13 LAB — LIPID PANEL
Cholesterol: 133 mg/dL (ref 0–200)
HDL: 45.3 mg/dL (ref >39.00)
LDL Cholesterol: 71 mg/dL (ref 0–99)
NonHDL: 87.69
Total CHOL/HDL Ratio: 3
Triglycerides: 82 mg/dL (ref 0.0–149.0)
VLDL: 16.4 mg/dL (ref 0.0–40.0)

## 2021-04-13 LAB — HEPATIC FUNCTION PANEL
ALT: 21 U/L (ref 0–53)
AST: 18 U/L (ref 0–37)
Albumin: 4 g/dL (ref 3.5–5.2)
Alkaline Phosphatase: 55 U/L (ref 39–117)
Bilirubin, Direct: 0.1 mg/dL (ref 0.0–0.3)
Total Bilirubin: 0.6 mg/dL (ref 0.2–1.2)
Total Protein: 6.5 g/dL (ref 6.0–8.3)

## 2021-04-13 LAB — CBC WITH DIFFERENTIAL/PLATELET
Basophils Absolute: 0 10*3/uL (ref 0.0–0.1)
Basophils Relative: 1 % (ref 0.0–3.0)
Eosinophils Absolute: 0.2 10*3/uL (ref 0.0–0.7)
Eosinophils Relative: 4.3 % (ref 0.0–5.0)
HCT: 40.6 % (ref 39.0–52.0)
Hemoglobin: 13.9 g/dL (ref 13.0–17.0)
Lymphocytes Relative: 25.8 % (ref 12.0–46.0)
Lymphs Abs: 1.3 10*3/uL (ref 0.7–4.0)
MCHC: 34.2 g/dL (ref 30.0–36.0)
MCV: 88.9 fl (ref 78.0–100.0)
Monocytes Absolute: 0.4 10*3/uL (ref 0.1–1.0)
Monocytes Relative: 8.2 % (ref 3.0–12.0)
Neutro Abs: 3 10*3/uL (ref 1.4–7.7)
Neutrophils Relative %: 60.7 % (ref 43.0–77.0)
Platelets: 247 10*3/uL (ref 150.0–400.0)
RBC: 4.56 Mil/uL (ref 4.22–5.81)
RDW: 13.9 % (ref 11.5–15.5)
WBC: 5 10*3/uL (ref 4.0–10.5)

## 2021-04-13 LAB — TSH: TSH: 3.21 u[IU]/mL (ref 0.35–5.50)

## 2021-04-13 LAB — PSA: PSA: 2.45 ng/mL (ref 0.10–4.00)

## 2021-04-13 LAB — HEMOGLOBIN A1C: Hgb A1c MFr Bld: 5.7 % (ref 4.6–6.5)

## 2021-04-13 NOTE — Progress Notes (Signed)
Blood work is pretty good including cholesterol .  Blood sugar is borderline but no diabetes. PSA is in normal range we will send to Dr. Billee Cashing d  [facility]  Remember to get [place] BP readings

## 2021-04-13 NOTE — Patient Instructions (Addendum)
Blood pressure goal is average of 130/80 or similar. Check blood pressure readings twice a day for 3 to 5 days at least 2 readings per setting Send readings in by MyChart.  We may want to adjust your blood pressure medicine .  ( Poss inc carvedilol to 25 mg twice a day)  Low sodium diet  and limit etoh can help the BP also  5# weight loss will help also .   Increase Lexapro escitalopram to 20 mg temporarily because of the anxiety Limit alcohol which will be adding to the anxiety.  We do not want to use Xanax with alcohol. Please carry easily absorbed Benadryl with you in case you get another staying.  If any systemic symptoms be seen in the emergency room and consider allergy evaluation.  Pneumonia vaccine today You may want to get shingles vaccine but can get side effects of a very sore arm and fever chills the next day. You can get this as a shot only appointment with [place] or at a pharmacy.[facility] pharmacy does these also.)     Plan fu depending  on BP and labs    Health Maintenance, Male Adopting a healthy lifestyle and getting preventive care are important in promoting health and wellness. Ask your health care provider about: The right schedule for you to have regular tests and exams. Things you can do on your own to prevent diseases and keep yourself healthy. What should I know about diet, weight, and exercise? Eat a healthy diet  Eat a diet that includes plenty of vegetables, fruits, low-fat dairy products, and lean protein. Do not eat a lot of foods that are high in solid fats, added sugars, or sodium.  Maintain a healthy weight Body mass index (BMI) is a measurement that can be used to identify possible weight problems. It estimates body fat based on height and weight. Your health care provider can help determine your BMI and help you achieve or maintain ahealthy weight. Get regular exercise Get regular exercise. This is one of the most important things you can do for your  health. Most adults should: Exercise for at least 150 minutes each week. The exercise should increase your heart rate and make you sweat (moderate-intensity exercise). Do strengthening exercises at least twice a week. This is in addition to the moderate-intensity exercise. Spend less time sitting. Even light physical activity can be beneficial. Watch cholesterol and blood lipids Have your blood tested for lipids and cholesterol at 68 years of age, then havethis test every 5 years. You may need to have your cholesterol levels checked more often if: Your lipid or cholesterol levels are high. You are older than 68 years of age. You are at high risk for heart disease. What should I know about cancer screening? Many types of cancers can be detected early and may often be prevented. Depending on your health history and family history, you may need to have cancer screening at various ages. This may include screening for: Colorectal cancer. Prostate cancer. Skin cancer. Lung cancer. What should I know about heart disease, diabetes, and high blood pressure? Blood pressure and heart disease High blood pressure causes heart disease and increases the risk of stroke. This is more likely to develop in people who have high blood pressure readings, are of African descent, or are overweight. Talk with your health care provider about your target blood pressure readings. Have your blood pressure checked: Every 3-5 years if you are 68-68 years of age. Every year  if you are 68 years old or older. If you are between the ages of 36 and 30 and are a current or former smoker, ask your health care provider if you should have a one-time screening for abdominal aortic aneurysm (AAA). Diabetes Have regular diabetes screenings. This checks your fasting blood sugar level. Have the screening done: Once every three years after age 68 if you are at a normal weight and have a low risk for diabetes. More often and at a  younger age if you are overweight or have a high risk for diabetes. What should I know about preventing infection? Hepatitis B If you have a higher risk for hepatitis B, you should be screened for this virus. Talk with your health care provider to find out if you are at risk forhepatitis B infection. Hepatitis C Blood testing is recommended for: Everyone born from [birth year] through 1965. Anyone with known risk factors for hepatitis C. Sexually transmitted infections (STIs) You should be screened each year for STIs, including gonorrhea and chlamydia, if: You are sexually active and are younger than 68 years of age. You are older than 68 years of age and your health care provider tells you that you are at risk for this type of infection. Your sexual activity has changed since you were last screened, and you are at increased risk for chlamydia or gonorrhea. Ask your health care provider if you are at risk. Ask your health care provider about whether you are at high risk for HIV. Your health care provider may recommend a prescription medicine to help prevent HIV infection. If you choose to take medicine to prevent HIV, you should first get tested for HIV. You should then be tested every 3 months for as long as you are taking the medicine. Follow these instructions at home: Lifestyle Do not use any products that contain nicotine or tobacco, such as cigarettes, e-cigarettes, and chewing tobacco. If you need help quitting, ask your health care provider. Do not use street drugs. Do not share needles. Ask your health care provider for help if you need support or information about quitting drugs. Alcohol use Do not drink alcohol if your health care provider tells you not to drink. If you drink alcohol: Limit how much you have to 0-2 drinks a day. Be aware of how much alcohol is in your drink. In the [place] one drink equals one 12 oz bottle of beer (355 mL), one 5 oz glass of wine (148 mL), or one 1 oz glass  of hard liquor (44 mL). General instructions Schedule regular health, dental, and eye exams. Stay current with your vaccines. Tell your health care provider if: You often feel depressed. You have ever been abused or do not feel safe at home. Summary Adopting a healthy lifestyle and getting preventive care are important in promoting health and wellness. Follow your health care provider's instructions about healthy diet, exercising, and getting tested or screened for diseases. Follow your health care provider's instructions on monitoring your cholesterol and blood pressure. This information is not intended to replace advice given to you by your health care provider. Make sure you discuss any questions you have with your healthcare provider. Document Revised: 09/11/2018 Document Reviewed: 09/11/2018 Elsevier Patient Education  2022 [employer].

## 2021-05-06 ENCOUNTER — Other Ambulatory Visit: Payer: Self-pay | Admitting: Internal Medicine

## 2021-06-07 ENCOUNTER — Other Ambulatory Visit: Payer: Self-pay | Admitting: Internal Medicine

## 2021-08-10 MED ORDER — ESCITALOPRAM OXALATE 20 MG PO TABS: 20.0000 mg | ORAL_TABLET | Freq: Every day | ORAL | 0 refills | Status: DC

## 2021-08-10 MED ORDER — CARVEDILOL 25 MG PO TABS: 12.5000 mg | ORAL_TABLET | Freq: Every day | ORAL | 1 refills | Status: DC

## 2021-10-03 ENCOUNTER — Encounter: Payer: Self-pay | Admitting: Internal Medicine

## 2021-10-03 ENCOUNTER — Other Ambulatory Visit: Payer: Self-pay | Admitting: Internal Medicine

## 2021-10-04 MED ORDER — CARVEDILOL 25 MG PO TABS: 12.5000 mg | ORAL_TABLET | Freq: Every day | ORAL | 1 refills | Status: DC

## 2021-10-04 MED ORDER — ATORVASTATIN CALCIUM 20 MG PO TABS: 20.0000 mg | ORAL_TABLET | Freq: Every day | ORAL | 0 refills | Status: DC

## 2021-10-04 MED ORDER — VALSARTAN-HYDROCHLOROTHIAZIDE 320-25 MG PO TABS: 1.0000 | ORAL_TABLET | Freq: Every day | ORAL | 0 refills | Status: DC

## 2021-10-04 MED ORDER — POTASSIUM CHLORIDE CRYS ER 20 MEQ PO TBCR: 20.0000 meq | EXTENDED_RELEASE_TABLET | Freq: Every day | ORAL | 0 refills | Status: DC

## 2021-10-04 MED ORDER — AMLODIPINE BESYLATE 10 MG PO TABS: 10.0000 mg | ORAL_TABLET | Freq: Every day | ORAL | 0 refills | Status: DC

## 2021-10-04 MED ORDER — ESCITALOPRAM OXALATE 20 MG PO TABS: 20.0000 mg | ORAL_TABLET | Freq: Every day | ORAL | 0 refills | Status: DC

## 2021-10-12 ENCOUNTER — Telehealth: Payer: Self-pay | Admitting: Internal Medicine

## 2021-10-12 NOTE — Telephone Encounter (Signed)
Patient called to get refill on carvedilol (COREG) 25 MG tablet. Patient states that he needs the prescription to be for one full tablet a day, 90 day supply.     Please send to   [id number] - [place], [address] Phone:  [phone]  Fax:  [phone]        Good callback number is [phone]  Please advise

## 2021-10-13 NOTE — Telephone Encounter (Signed)
So for better BP control should be taken twice a day unless extended release.  Not convinced the medication caused the weight gain  but if bp is controlled ok lower dose , lower dose is ok.   Would rx as carvedilol as 1/2  twice a day  ( one 25 per day)

## 2021-10-14 MED ORDER — CARVEDILOL 25 MG PO TABS: 12.5000 mg | ORAL_TABLET | Freq: Two times a day (BID) | ORAL | 2 refills | Status: DC

## 2021-10-14 NOTE — Telephone Encounter (Signed)
Pt notified of PCP response: So for better BP control should be taken twice a day unless extended release.   Not convinced the medication caused the weight gain  but if bp is controlled ok lower dose , lower dose is ok.    Would rx as carvedilol as 1/2  twice a day  ( one 25 per day)  Pt able to repeat back instructions & request that medication be refilled to pharmacy on file with the correct instructions.

## 2021-10-14 NOTE — Telephone Encounter (Signed)
This was answered Jan 12 please see patient message from Jan 2

## 2021-10-14 NOTE — Addendum Note (Signed)
Addended by: Elza Rafter D on: 10/14/2021 04:29 PM   Modules accepted: Orders

## 2021-12-21 ENCOUNTER — Other Ambulatory Visit (HOSPITAL_BASED_OUTPATIENT_CLINIC_OR_DEPARTMENT_OTHER): Payer: Self-pay

## 2021-12-21 MED ORDER — ZOSTER VAC RECOMB ADJUVANTED 50 MCG/0.5ML IM SUSR
INTRAMUSCULAR | 0 refills | Status: DC
  Filled 2021-12-21: qty 0.5, 1d supply, fill #0

## 2021-12-22 ENCOUNTER — Other Ambulatory Visit (HOSPITAL_BASED_OUTPATIENT_CLINIC_OR_DEPARTMENT_OTHER): Payer: Self-pay

## 2021-12-29 ENCOUNTER — Other Ambulatory Visit (HOSPITAL_BASED_OUTPATIENT_CLINIC_OR_DEPARTMENT_OTHER): Payer: Self-pay

## 2022-01-01 ENCOUNTER — Other Ambulatory Visit: Payer: Self-pay | Admitting: Internal Medicine

## 2022-01-12 ENCOUNTER — Encounter: Payer: Self-pay | Admitting: Internal Medicine

## 2022-01-12 ENCOUNTER — Telehealth (INDEPENDENT_AMBULATORY_CARE_PROVIDER_SITE_OTHER): Payer: Federal, State, Local not specified - PPO | Admitting: Internal Medicine

## 2022-01-12 DIAGNOSIS — J069 Acute upper respiratory infection, unspecified: Secondary | ICD-10-CM | POA: Diagnosis not present

## 2022-01-12 MED ORDER — PROMETHAZINE-DM 6.25-15 MG/5ML PO SYRP: 5.0000 mL | ORAL_SOLUTION | Freq: Four times a day (QID) | ORAL | 0 refills | Status: DC | PRN

## 2022-01-12 MED ORDER — BENZONATATE 100 MG PO CAPS: 100.0000 mg | ORAL_CAPSULE | Freq: Three times a day (TID) | ORAL | 0 refills | Status: DC | PRN

## 2022-01-12 NOTE — Progress Notes (Signed)
?Virtual Visit via Video Note ? ?I connected with Jaime Orr on 01/13/22 at  9:00 AM EDT by a video enabled telemedicine application and verified that I am speaking with the correct person using two identifiers. ?Location patient: home ?Location providerhome office ?Persons participating in the virtual visit: patient, provider ? ?[place] national recommendations  regarding COVID 19 pandemic   video visit is advised over in office visit for this patient.  ?Patient aware  of the limitations of evaluation and management by telemedicine and  availability of in person appointments. and agreed to proceed. ? ? ?HPI: ?LLIAM Orr presents for video visit onset 4-5 days of  ur congestion feeling and then cough hard to control but no fever sob hemoptysis  face pain . Persistent  continues to work .  ?Needs help with cough sx .   ? ?Hasnt checked  covid test yet .  ?ROS: See pertinent positives and negatives per HPI. ? No tobacco or inhalants  lung diseaes ?Past Medical History:  ?Diagnosis Date  ? Anxiety   ? Cancer [facility])   ? skin cancer  ? DEPRESSION 12/02/2008  ? GERD 12/02/2008  ?  no endo  ? 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 [place] kidney   ? simple cyst on ct  [place] 2010  ? HYPERGLYCEMIA, MILD 01/19/2010  ? HYPERTENSION 12/02/2008  ? Hypertension   ? IRON DEFICIENCY 07/18/2010  ? doantes blood  ? Muscle fasciculation 11/14/2010  ? Muscle twitch 11/14/2010  ? Left rm  Poss overuse  R/o metabolic    ? OBESITY 08/09/2010  ? SNORING 07/18/2010  ? ? ?Past Surgical History:  ?Procedure Laterality Date  ? COLONOSCOPY    ? KNEE ARTHROSCOPY WITH LATERAL MENISECTOMY Right 04/22/2015  ? Procedure: KNEE ARTHROSCOPY WITH LATERAL MENISECTOMY;  Surgeon: Kathryne Hitch, MD;  Location: [facility];  Service: Orthopedics;  Laterality: Right;  ? KNEE ARTHROSCOPY WITH MEDIAL MENISECTOMY Right 04/22/2015  ? Procedure: RIGHT KNEE ARTHROSCOPY WITH  CHONDROPLASTY MEDIAL MENISECTOMY;  Surgeon: Kathryne Hitch, MD;  Location: [facility];  Service: Orthopedics;  Laterality: Right;  ? KNEE CARTILAGE SURGERY  2009  ? Left Knee   ? POLYPECTOMY    ? SHOULDER SURGERY  1994  ? Left  ? TONSILLECTOMY  1963  ? TONSILLECTOMY    ? ? ?Family History  ?Problem Relation Age of Onset  ? Stroke Mother   ?     44  ? Depression Mother   ? Hypertension Father   ? Heart disease Father   ?     40  ? Alcohol abuse Father   ? Colon polyps Father   ? Sleep apnea Sister   ? Colon cancer Neg Hx   ? Esophageal cancer Neg Hx   ? Rectal cancer Neg Hx   ? Stomach cancer Neg Hx   ? ? ?Social History  ? ?Tobacco Use  ? Smoking status: Former  ? Smokeless tobacco: Never  ?Vaping Use  ? Vaping Use: Never used  ?Substance Use Topics  ? Alcohol use: Yes  ?  Alcohol/week: 14.0 standard drinks  ?  Types: 14 Cans of beer per week  ?  Comment: daily 2-3 beers/day  ? Drug use: No  ? ? ? ? ?Current Outpatient Medications:  ?  benzonatate (TESSALON PERLES) 100 MG capsule, Take 1-2 capsules (100-200 mg total) by mouth 3 (three) times daily as needed for cough. Maximum 600 mg per day, Disp: 24 capsule,  Rfl: 0 ?  promethazine-dextromethorphan (PROMETHAZINE-DM) 6.25-15 MG/5ML syrup, Take 5 mLs by mouth every 6 (six) hours as needed for cough (at night)., Disp: 118 mL, Rfl: 0 ?  amLODipine (NORVASC) 10 MG tablet, Take 1 tablet (10 mg total) by mouth daily., Disp: 90 tablet, Rfl: 0 ?  atorvastatin (LIPITOR) 20 MG tablet, TAKE 1 TABLET BY MOUTH EVERY DAY, Disp: 90 tablet, Rfl: 0 ?  carvedilol (COREG) 25 MG tablet, Take 0.5 tablets (12.5 mg total) by mouth 2 (two) times daily with a meal., Disp: 90 tablet, Rfl: 2 ?  escitalopram (LEXAPRO) 20 MG tablet, Take 1 tablet (20 mg total) by mouth daily., Disp: 90 tablet, Rfl: 0 ?  ketoconazole (NIZORAL) 2 % cream, Apply 1 application topically 2 (two) times daily., Disp: 30 g, Rfl: 1 ?  potassium chloride SA (KLOR-CON M20) 20 MEQ tablet, Take 1 tablet (20 mEq total) by mouth daily., Disp: 90 tablet, Rfl: 0 ?  sildenafil (REVATIO) 20 MG  tablet, Take 1 tablet (20 mg total) by mouth 3 (three) times daily., Disp: 90 tablet, Rfl: 0 ?  tamsulosin (FLOMAX) 0.4 MG CAPS capsule, Take 1 capsule (0.4 mg total) by mouth daily. Take 1 capsule daily ( Please contact Urology for further refills if still managing), Disp: 30 capsule, Rfl: 0 ?  triamcinolone cream (KENALOG) 0.1 %, Apply 1 application topically 2 (two) times daily. As needed for itching, Disp: 30 g, Rfl: 0 ?  valsartan-hydrochlorothiazide (DIOVAN-HCT) 320-25 MG tablet, Take 1 tablet by mouth daily., Disp: 90 tablet, Rfl: 0 ?  Zoster Vaccine Adjuvanted [facility]) injection, Inject into the muscle., Disp: 0.5 mL, Rfl: 0 ? ?EXAM: ?BP Readings from Last 3 Encounters:  ?04/13/21 (!) 148/80  ?02/20/20 120/78  ?12/16/18 138/82  ? ? ?VITALS per patient if applicable: ? ?GENERAL: alert, oriented, appears well and in no acute distress no facial edema   no resp distress ? ?HEENT: atraumatic, conjunttiva clear, no obvious abnormalities on inspection of external nose and ears ? ?NECK: normal movements of the head and neck ? ?LUNGS: on inspection no signs of respiratory distress, breathing rate appears normal, no obvious gross SOB, gasping or wheezing ? ?CV: no obvious cyanosis ? ?MS: moves all visible extremities without noticeable abnormality ? ?PSYCH/NEURO: pleasant and cooperative, no obvious depression or anxiety, speech and thought processing grossly intact ?Lab Results  ?Component Value Date  ? WBC 5.0 04/13/2021  ? HGB 13.9 04/13/2021  ? HCT 40.6 04/13/2021  ? PLT 247.0 04/13/2021  ? GLUCOSE 105 (H) 04/13/2021  ? CHOL 133 04/13/2021  ? TRIG 82.0 04/13/2021  ? HDL 45.30 04/13/2021  ? LDLDIRECT 75.0 03/11/2018  ? Jaime Orr 73 04/13/2021  ? ALT 21 04/13/2021  ? AST 18 04/13/2021  ? NA 140 04/13/2021  ? K 3.6 04/13/2021  ? CL 104 04/13/2021  ? CREATININE 0.83 04/13/2021  ? BUN 16 04/13/2021  ? CO2 28 04/13/2021  ? TSH 3.21 04/13/2021  ? PSA 2.45 04/13/2021  ? HGBA1C 5.7 04/13/2021  ? MICROALBUR 1.7 07/08/2010   ? ? ?ASSESSMENT AND PLAN: ? ?Discussed the following assessment and plan: ? ?  ICD-10-CM   ?1. Viral upper respiratory tract infection with cough  J06.9   ?  ? ?  Expectant management. For alarm sx  ?Course of illness  disc   supportive care sx and meds    ?Avoiding narcotic   will rx phen dm and tessalon perles ?Fluids . ?Do home covid test and send in results. Reviewed  antivirals candidate possible  if positive.  ? ?Counseled.  ? Expectant management and discussion of plan and treatment with opportunity to ask questions and all were answered. The patient agreed with the plan and demonstrated an understanding of the instructions. ?  ?Advised to call back or seek an in-person evaluation if worsening  or having  further concerns  in interim. ?Return if symptoms worsen or fail to improve as expected. ? ? ? ?Shanon Ace, MD  ?

## 2022-01-13 ENCOUNTER — Telehealth: Payer: Federal, State, Local not specified - PPO | Admitting: Emergency Medicine

## 2022-01-13 ENCOUNTER — Encounter: Payer: Self-pay | Admitting: Internal Medicine

## 2022-01-13 DIAGNOSIS — J069 Acute upper respiratory infection, unspecified: Secondary | ICD-10-CM | POA: Diagnosis not present

## 2022-01-13 NOTE — Progress Notes (Signed)
We are sorry that you are not feeling well.  Here is how we plan to help! ? ?Based on your presentation I believe you most likely have A cough due to a virus.  This is called viral bronchitis and is best treated by rest, plenty of fluids and control of the cough.  You may use Ibuprofen or Tylenol as directed to help your symptoms.   ?  ?I'm sorry to hear that you aren't feeling any better.  Unfortunately, there aren't many other good options for cough other than what has been prescribed.   ? ?This sounds viral and will need to run its course.   ? ?If you worsen over the weekend, you might consider going to an urgent care to get an x-ray of your lungs. ? ? ? ?From your responses in the eVisit questionnaire you describe inflammation in the upper respiratory tract which is causing a significant cough.  This is commonly called Bronchitis and has four common causes:   ?Allergies ?Viral Infections ?Acid Reflux ?Bacterial Infection ?Allergies, viruses and acid reflux are treated by controlling symptoms or eliminating the cause. An example might be a cough caused by taking certain blood pressure medications. You stop the cough by changing the medication. Another example might be a cough caused by acid reflux. Controlling the reflux helps control the cough. ? ?USE OF BRONCHODILATOR ("RESCUE") INHALERS: ?There is a risk from using your bronchodilator too frequently.  The risk is that over-reliance on a medication which only relaxes the muscles surrounding the breathing tubes can reduce the effectiveness of medications prescribed to reduce swelling and congestion of the tubes themselves.  Although you feel brief relief from the bronchodilator inhaler, your asthma may actually be worsening with the tubes becoming more swollen and filled with mucus.  This can delay other crucial treatments, such as oral steroid medications. If you need to use a bronchodilator inhaler daily, several times per day, you should discuss this with  your provider.  There are probably better treatments that could be used to keep your asthma under control.  ?   ?HOME CARE ?Only take medications as instructed by your medical team. ?Complete the entire course of an antibiotic. ?Drink plenty of fluids and get plenty of rest. ?Avoid close contacts especially the very young and the elderly ?Cover your mouth if you cough or cough into your sleeve. ?Always remember to wash your hands ?A steam or ultrasonic humidifier can help congestion.  ? ?GET HELP RIGHT AWAY IF: ?You develop worsening fever. ?You become short of breath ?You cough up blood. ?Your symptoms persist after you have completed your treatment plan ?MAKE SURE YOU  ?Understand these instructions. ?Will watch your condition. ?Will get help right away if you are not doing well or get worse. ?  ? ?Thank you for choosing an e-visit. ? ?Your e-visit answers were reviewed by a board certified advanced clinical practitioner to complete your personal care plan. Depending upon the condition, your plan could have included both over the counter or prescription medications. ? ?Please review your pharmacy choice. Make sure the pharmacy is open so you can pick up prescription now. If there is a problem, you may contact your provider through [employer] and have the prescription routed to another pharmacy.  Your safety is important to [place]. If you have drug allergies check your prescription carefully.  ? ?For the next 24 hours you can use MyChart to ask questions about today's visit, request a non-urgent call back, or ask  for a work or school excuse. ?You will get an email in the next two days asking about your experience. I hope that your e-visit has been valuable and will speed your recovery. ? ?Approximately 5 minutes was used in reviewing the patient's chart, questionnaire, prescribing medications, and documentation. ? ? ?

## 2022-01-14 ENCOUNTER — Other Ambulatory Visit: Payer: Self-pay

## 2022-01-14 ENCOUNTER — Emergency Department (HOSPITAL_BASED_OUTPATIENT_CLINIC_OR_DEPARTMENT_OTHER): Payer: Federal, State, Local not specified - PPO

## 2022-01-14 ENCOUNTER — Emergency Department (HOSPITAL_BASED_OUTPATIENT_CLINIC_OR_DEPARTMENT_OTHER)
Admission: EM | Admit: 2022-01-14 | Discharge: 2022-01-14 | Disposition: A | Discharge status: Home / Self Care | Payer: Federal, State, Local not specified - PPO | Attending: Emergency Medicine | Admitting: Emergency Medicine

## 2022-01-14 ENCOUNTER — Encounter (HOSPITAL_BASED_OUTPATIENT_CLINIC_OR_DEPARTMENT_OTHER): Payer: Self-pay | Admitting: Emergency Medicine

## 2022-01-14 DIAGNOSIS — Z20822 Contact with and (suspected) exposure to covid-19: Secondary | ICD-10-CM | POA: Insufficient documentation

## 2022-01-14 DIAGNOSIS — Z79899 Other long term (current) drug therapy: Secondary | ICD-10-CM | POA: Diagnosis not present

## 2022-01-14 DIAGNOSIS — I10 Essential (primary) hypertension: Secondary | ICD-10-CM | POA: Diagnosis not present

## 2022-01-14 DIAGNOSIS — H9202 Otalgia, left ear: Secondary | ICD-10-CM | POA: Diagnosis present

## 2022-01-14 DIAGNOSIS — H7292 Unspecified perforation of tympanic membrane, left ear: Secondary | ICD-10-CM | POA: Diagnosis not present

## 2022-01-14 LAB — RESP PANEL BY RT-PCR (FLU A&B, COVID) ARPGX2
Influenza A by PCR: NEGATIVE
Influenza B by PCR: NEGATIVE
SARS Coronavirus 2 by RT PCR: NEGATIVE

## 2022-01-14 LAB — GROUP A STREP BY PCR: Group A Strep by PCR: NOT DETECTED

## 2022-01-14 MED ORDER — AMOXICILLIN-POT CLAVULANATE 875-125 MG PO TABS: 1.0000 | ORAL_TABLET | Freq: Two times a day (BID) | ORAL | 0 refills | Status: DC

## 2022-01-14 NOTE — ED Provider Notes (Signed)
?[facility] EMERGENCY DEPT ?Provider Note ? ? ?CSN: [id number] ?Arrival date & time: 01/14/22  0701 ? ?  ? ?History ? ?Chief Complaint  ?Patient presents with  ? Ear Drainage  ? ? ?Jaime Orr is a 71 y.o. male. ? ?HPI ?Patient presents for concern of left TM rupture.  Medical history includes HTN, GERD, BPH, question kidney, depression, obesity, HLD, alcohol use.  He has had URI symptoms for the past 3 days.  This includes cough, congestion, and left ear pain.  Last night, he woke and experienced a relief of the pressure behind his left ear.  When he placed his finger in his ear canal, he noticed some blood.  He has not noticed a hearing loss but patient does have presbycusis at baseline wears hearing aids.  He has had virtual telehealth visits and has been prescribed cough syrup and Tessalon Perles, which she has been taking.  He has not been taking Tylenol or Motrin for relief of other symptoms. ?  ? ?Home Medications ?Prior to Admission medications   ?Medication Sig Start Date End Date Taking? Authorizing Provider  ?amoxicillin-clavulanate (AUGMENTIN) 875-125 MG tablet Take 1 tablet by mouth every 12 (twelve) hours. 01/14/22  Yes Godfrey Pick, MD  ?amLODipine (NORVASC) 10 MG tablet Take 1 tablet (10 mg total) by mouth daily. 10/04/21   Panosh, Standley Brooking, MD  ?atorvastatin (LIPITOR) 20 MG tablet TAKE 1 TABLET BY MOUTH EVERY DAY 01/02/22   Panosh, Standley Brooking, MD  ?benzonatate (TESSALON PERLES) 100 MG capsule Take 1-2 capsules (100-200 mg total) by mouth 3 (three) times daily as needed for cough. Maximum 600 mg per day 01/12/22   Panosh, Standley Brooking, MD  ?carvedilol (COREG) 25 MG tablet Take 0.5 tablets (12.5 mg total) by mouth 2 (two) times daily with a meal. 10/14/21   Panosh, Standley Brooking, MD  ?escitalopram (LEXAPRO) 20 MG tablet Take 1 tablet (20 mg total) by mouth daily. 10/04/21   Panosh, Standley Brooking, MD  ?ketoconazole (NIZORAL) 2 % cream Apply 1 application topically 2 (two) times daily. 04/17/17   Panosh, Standley Brooking, MD   ?potassium chloride SA (KLOR-CON M20) 20 MEQ tablet Take 1 tablet (20 mEq total) by mouth daily. 10/04/21   Panosh, Standley Brooking, MD  ?promethazine-dextromethorphan (PROMETHAZINE-DM) 6.25-15 MG/5ML syrup Take 5 mLs by mouth every 6 (six) hours as needed for cough (at night). 01/12/22   Panosh, Standley Brooking, MD  ?sildenafil (REVATIO) 20 MG tablet Take 1 tablet (20 mg total) by mouth 3 (three) times daily. 11/29/17   Panosh, Standley Brooking, MD  ?tamsulosin (FLOMAX) 0.4 MG CAPS capsule Take 1 capsule (0.4 mg total) by mouth daily. Take 1 capsule daily ( Please contact Urology for further refills if still managing) 07/28/19   Panosh, Standley Brooking, MD  ?triamcinolone cream (KENALOG) 0.1 % Apply 1 application topically 2 (two) times daily. As needed for itching 04/17/17   Panosh, Standley Brooking, MD  ?valsartan-hydrochlorothiazide (DIOVAN-HCT) 320-25 MG tablet Take 1 tablet by mouth daily. 10/04/21   Panosh, Standley Brooking, MD  ?Zoster Vaccine Adjuvanted [facility]) injection Inject into the muscle. 12/21/21   Margie Ege, [facility]  ?   ? ?Allergies    ?Codeine and Wellbutrin [bupropion]   ? ?Review of Systems   ?Review of Systems  ?HENT:  Positive for congestion, ear discharge, ear pain and sore throat.   ?Respiratory:  Positive for cough.   ?All other systems reviewed and are negative. ? ?Physical Exam ?Updated Vital Signs ?BP (!) 155/81  Pulse 71   Temp 99.7 ?F (37.6 ?C) (Oral)   Resp 16   SpO2 98%  ?Physical Exam ?Vitals and nursing note reviewed.  ?Constitutional:   ?   General: He is not in acute distress. ?   Appearance: Normal appearance. He is well-developed and normal weight. He is not ill-appearing, toxic-appearing or diaphoretic.  ?HENT:  ?   Head: Normocephalic and atraumatic.  ?   Right Ear: Tympanic membrane, ear canal and external ear normal.  ?   Left Ear: External ear normal. Decreased hearing noted. No drainage. No foreign body. No mastoid tenderness. Tympanic membrane is perforated.  ?   Nose: Congestion present.  ?   Mouth/Throat:  ?   Mouth:  Mucous membranes are moist.  ?   Pharynx: Oropharynx is clear. No oropharyngeal exudate or posterior oropharyngeal erythema.  ?Eyes:  ?   Extraocular Movements: Extraocular movements intact.  ?   Conjunctiva/sclera: Conjunctivae normal.  ?Cardiovascular:  ?   Rate and Rhythm: Normal rate and regular rhythm.  ?   Heart sounds: No murmur heard. ?Pulmonary:  ?   Effort: Pulmonary effort is normal. No respiratory distress.  ?   Breath sounds: Normal breath sounds. No wheezing or rales.  ?Abdominal:  ?   Palpations: Abdomen is soft.  ?   Tenderness: There is no abdominal tenderness.  ?Musculoskeletal:     ?   General: No swelling. Normal range of motion.  ?   Cervical back: Normal range of motion and neck supple. No rigidity.  ?Skin: ?   General: Skin is warm and dry.  ?   Capillary Refill: Capillary refill takes less than 2 seconds.  ?   Coloration: Skin is not jaundiced or pale.  ?Neurological:  ?   General: No focal deficit present.  ?   Mental Status: He is alert and oriented to person, place, and time.  ?   Cranial Nerves: No cranial nerve deficit.  ?   Sensory: No sensory deficit.  ?   Motor: No weakness.  ?   Coordination: Coordination normal.  ?Psychiatric:     ?   Mood and Affect: Mood normal.     ?   Behavior: Behavior normal.     ?   Thought Content: Thought content normal.     ?   Judgment: Judgment normal.  ? ? ?ED Results / Procedures / Treatments   ?Labs ?(all labs ordered are listed, but only abnormal results are displayed) ?Labs Reviewed  ?RESP PANEL BY RT-PCR (FLU A&B, COVID) [id number]  ?GROUP A STREP BY PCR  ? ? ?EKG ?None ? ?Radiology ?DG Chest Portable 1 View ? ?Result Date: 01/14/2022 ?CLINICAL DATA:  71 year old male with cough congestion and sore throat. EXAM: PORTABLE CHEST 1 VIEW COMPARISON:  Chest radiographs 10/30/2016 and earlier. FINDINGS: Portable AP upright view at 0744 hours. Lung volumes and mediastinal contours remain normal. Visualized tracheal air column is within normal limits. Allowing  for portable technique the lungs are clear. No pneumothorax or pleural effusion. Chronic postoperative changes to the left shoulder. No acute osseous abnormality identified. Negative visible bowel gas. IMPRESSION: Negative portable chest. Electronically Signed   By: Genevie Ann M.D.   On: 01/14/2022 08:01   ? ?Procedures ?Procedures  ? ? ?Medications Ordered in ED ?Medications - No data to display ? ?ED Course/ Medical Decision Making/ A&P ?  ?                        ?  Medical Decision Making ?Amount and/or Complexity of Data Reviewed ?Radiology: ordered. ? ?Risk ?Prescription drug management. ? ? ?71 year old male presenting for concern of left TM rupture.  He has had URI symptoms over the past 3 days.  This has mostly caused him discomfort with his cough.  He has been prescribed cough syrup and Tessalon Perles.  He has not had any difficulty with breathing.  His main concern today is waking up last night with relieved pressure in his left ear in addition to some bloody discharge.  Differential diagnosis includes ruptured TM secondary to AOM, AOE, mastoiditis, and foreign body.  His medical evaluation is complicated by history of HTN, GERD, BPH, question kidney, depression, obesity, HLD, alcohol use.  Patient is well-appearing on arrival.  Vital signs are notable for moderate hypertension.  On exam, does appear that he has a ruptured left TM.  I do not appreciate any discharge at this time.  He does have complaints of sore throat but oropharynx is nonerythematous without exudates or swelling.  He has had a cough but lung auscultation is clear bilaterally.  Patient was tested for COVID and flu, both of which were negative.  Chest x-ray showed no evidence of pneumonia.  Patient remained minimally symptomatic without any significant pain during his ED observation.  Patient was prescribed Augmentin for treatment of otitis.  He was advised to avoid water getting into his affected ear.  He was also advised to schedule a  follow-up appoint with ENT to ensure proper healing and to return to the emergency department if he experiences any worsening of symptoms.  He was discharged in stable condition. ? ? ? ? ? ? ? ?Final Clinical

## 2022-01-14 NOTE — Discharge Instructions (Signed)
An antibiotic was sent to your pharmacy.  This is to treat an ear infection.  You have a perforated tympanic membrane on the left ear.  This should heal on its own.  You can promote healing by avoiding any water getting into the area.  You should get seen again by an ear nose and throat specialist to ensure proper healing.  There is a number below to call for follow-up appointment.  If you develop worsening symptoms, please return to the emergency department. ?

## 2022-01-14 NOTE — ED Notes (Signed)
Dc instructions reviewed with patient. Patient voiced understanding. Dc with belongings.  °

## 2022-01-14 NOTE — ED Triage Notes (Signed)
Pt presents with 3 days of congestion , cough,sore throat , left ear starting hurting yesterday and started bleeding . Felt like his left side of face was swollen and hot.  ?

## 2022-01-16 ENCOUNTER — Other Ambulatory Visit: Payer: Self-pay | Admitting: Internal Medicine

## 2022-01-19 ENCOUNTER — Other Ambulatory Visit: Payer: Self-pay | Admitting: Internal Medicine

## 2022-01-20 ENCOUNTER — Other Ambulatory Visit: Payer: Self-pay | Admitting: Internal Medicine

## 2022-01-23 ENCOUNTER — Encounter: Payer: Self-pay | Admitting: Family Medicine

## 2022-01-23 ENCOUNTER — Ambulatory Visit: Payer: Federal, State, Local not specified - PPO | Admitting: Family Medicine

## 2022-01-23 VITALS — BP 148/70 | HR 114 | Temp 97.4°F | Ht 67.0 in | Wt 217.7 lb

## 2022-01-23 DIAGNOSIS — H7292 Unspecified perforation of tympanic membrane, left ear: Secondary | ICD-10-CM

## 2022-01-23 DIAGNOSIS — J019 Acute sinusitis, unspecified: Secondary | ICD-10-CM

## 2022-01-23 MED ORDER — AZITHROMYCIN 250 MG PO TABS: ORAL_TABLET | ORAL | 0 refills | Status: AC

## 2022-01-23 MED ORDER — BENZONATATE 100 MG PO CAPS
100.0000 mg | ORAL_CAPSULE | Freq: Three times a day (TID) | ORAL | 0 refills | Status: DC | PRN
Start: 2022-01-23 — End: 2022-03-21

## 2022-01-23 NOTE — Progress Notes (Signed)
?Subjective:  ?  ? Patient ID: Jaime Orr, male   DOB: September 28, 1951, 71 y.o.   MRN: [id number] ? ?HPI ? ?Patient is seen for ongoing cough and symptoms of left ear fullness and nasal congestion.  He had virtual visit here on the 13th and was felt to have viral URI.  He then had E-visit the very next day.  Treated symptomatically with plenty of fluids and Tessalon Perles for his cough.  He then presented to the ER on the 15th with left TM perforation.  He did notice some blood from the left ear canal.  He does have some chronic hearing loss and uses hearing aids bilaterally.  Has not had any further drainage in the left ear.  He was treated with Augmentin for 6 days.  No fever.  Does have some facial pressure.  He states that Zithromax has worked much better for him for sinus infections in the past.  Cough is productive of brown sputum. ? ?Past Medical History:  ?Diagnosis Date  ? Anxiety   ? Cancer [facility])   ? skin cancer  ? DEPRESSION 12/02/2008  ? GERD 12/02/2008  ?  no endo  ? 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 Horseshoe kidney   ? simple cyst on ct  [place] 2010  ? HYPERGLYCEMIA, MILD 01/19/2010  ? HYPERTENSION 12/02/2008  ? Hypertension   ? IRON DEFICIENCY 07/18/2010  ? doantes blood  ? Muscle fasciculation 11/14/2010  ? Muscle twitch 11/14/2010  ? Left rm  Poss overuse  R/o metabolic    ? OBESITY 08/09/2010  ? SNORING 07/18/2010  ? ?Past Surgical History:  ?Procedure Laterality Date  ? COLONOSCOPY    ? KNEE ARTHROSCOPY WITH LATERAL MENISECTOMY Right 04/22/2015  ? Procedure: KNEE ARTHROSCOPY WITH LATERAL MENISECTOMY;  Surgeon: Kathryne Hitch, MD;  Location: [place];  Service: Orthopedics;  Laterality: Right;  ? KNEE ARTHROSCOPY WITH MEDIAL MENISECTOMY Right 04/22/2015  ? Procedure: RIGHT KNEE ARTHROSCOPY WITH  CHONDROPLASTY MEDIAL MENISECTOMY;  Surgeon: Kathryne Hitch, MD;  Location: [place];  Service: Orthopedics;  Laterality: Right;  ? KNEE CARTILAGE SURGERY  2009  ? Left Knee   ? POLYPECTOMY     ? SHOULDER SURGERY  1994  ? Left  ? TONSILLECTOMY  1963  ? TONSILLECTOMY    ? ? reports that he has quit smoking. He has never used smokeless tobacco. He reports current alcohol use of about 14.0 standard drinks per week. He reports that he does not use drugs. ?family history includes Alcohol abuse in his father; Colon polyps in his father; Depression in his mother; Heart disease in his father; Hypertension in his father; Sleep apnea in his sister; Stroke in his mother. ?Allergies  ?Allergen Reactions  ? Codeine Other (See Comments)  ?  REACTION: confusion  ? Wellbutrin [Bupropion] Anxiety  ? ? ? ? ?Review of Systems  ?Constitutional:  Negative for chills and fever.  ?HENT:  Positive for congestion and sinus pressure.   ?Respiratory:  Positive for cough. Negative for shortness of breath and wheezing.   ?Cardiovascular:  Negative for chest pain.  ? ?   ?Objective:  ? Physical Exam ?Vitals reviewed.  ?Constitutional:   ?   Appearance: Normal appearance. He is not ill-appearing.  ?HENT:  ?   Ears:  ?   Comments: Right eardrum is normal.  Left eardrum reveals what appears to be a small healing perforation along the anterior aspect of the TM.  Little bit of  dried blood over this region.  No purulent drainage in the canal.  Still has a little bit of anatomic distortion of the TM landmarks ?Cardiovascular:  ?   Rate and Rhythm: Normal rate and regular rhythm.  ?Pulmonary:  ?   Effort: Pulmonary effort is normal.  ?   Breath sounds: Normal breath sounds. No wheezing or rales.  ?Neurological:  ?   Mental Status: He is alert.  ? ? ?   ?Assessment:  ?   ?Recent URI with left TM perforation.  Patient still has significant facial pain and pressure and is describing some purulent nasal discharge.  Treated with just 6-day course of Augmentin as above.  Suspect he will have uneventful healing of left TM with very small perforation. ?   ?Plan:  ?   ?-Recommend follow-up with primary in a few weeks to document healing of left  TM ?-Patient requesting Zithromax.  He states this is worked much better for sinuses in the past.  We did discuss resistance patterns frequently seen with Zithromax but agreed to send in 5-day course for his persistent sinusitis symptoms ?-Continue plenty of fluids and over-the-counter Mucinex. ?-Refill Tessalon to use as needed for cough ? ?Eulas Post MD ? Primary Care at [facility] ? ?   ?

## 2022-01-23 NOTE — Patient Instructions (Signed)
Continue to keep water out of left ear ? ?Set up appointment with Dr Regis Bill in next several weeks to document healing ?

## 2022-01-27 ENCOUNTER — Other Ambulatory Visit: Payer: Self-pay | Admitting: Internal Medicine

## 2022-02-07 ENCOUNTER — Other Ambulatory Visit: Payer: Self-pay | Admitting: Internal Medicine

## 2022-02-07 ENCOUNTER — Ambulatory Visit: Payer: Federal, State, Local not specified - PPO | Admitting: Internal Medicine

## 2022-02-07 MED ORDER — PROMETHAZINE-DM 6.25-15 MG/5ML PO SYRP: 5.0000 mL | ORAL_SOLUTION | Freq: Four times a day (QID) | ORAL | 0 refills | Status: DC | PRN

## 2022-02-07 NOTE — Telephone Encounter (Signed)
See other message  I sent in cough med and can use otc claritin zyrtec or allegra  painif needs antihistamine in day  ?

## 2022-02-07 NOTE — Progress Notes (Unsigned)
I refilled the cough medicine promethazine dm  ?Can use otc antihistamine in day such as zyrtec allegra claritin  ( plain)  ?Hope you feel better soon ?

## 2022-03-21 ENCOUNTER — Telehealth (INDEPENDENT_AMBULATORY_CARE_PROVIDER_SITE_OTHER): Payer: Federal, State, Local not specified - PPO | Admitting: Family Medicine

## 2022-03-21 ENCOUNTER — Encounter: Payer: Self-pay | Admitting: Family Medicine

## 2022-03-21 VITALS — Wt 210.0 lb

## 2022-03-21 DIAGNOSIS — H5789 Other specified disorders of eye and adnexa: Secondary | ICD-10-CM | POA: Diagnosis not present

## 2022-03-21 MED ORDER — ERYTHROMYCIN 5 MG/GM OP OINT: 1.0000 | TOPICAL_OINTMENT | Freq: Three times a day (TID) | OPHTHALMIC | 0 refills | Status: DC

## 2022-03-21 NOTE — Progress Notes (Signed)
Virtual Visit via Video Note  I connected with Jaime Orr  on 03/21/22 at  3:40 PM EDT by a video enabled telemedicine application and verified that I am speaking with the correct person using two identifiers.  Location patient: [place] Location provider:work or home office Persons participating in the virtual visit: patient, provider, patient's wife  I discussed the limitations and requested verbal permission for telemedicine visit. The patient expressed understanding and agreed to proceed.   HPI:  Acute telemedicine visit for eye issues: -Onset:2 days ago -Symptoms include: both eye are red, itchy and have some drainage - drainage is a little mucky and eyelids stuck in the mornings, eyes feel irritated -Denies:headache, fever, vision changes, foreign body in the eye, cough, congestion -has been on vacation in [place] and just returned -has has some nasal symptoms for a few months with allergies - but not eye issues -Has tried:nothing -Pertinent past medical history: see below -Pertinent medication allergies: Allergies  Allergen Reactions   Codeine Other (See Comments)    REACTION: confusion   Wellbutrin [Bupropion] Anxiety   -COVID-19 vaccine status:  Immunization History  Administered Date(s) Administered   Influenza Split 08/14/2011, 08/05/2012   Influenza Whole 07/18/2010   Influenza,inj,Quad PF,6+ Mos 06/21/2015, 06/16/2016, 11/12/2017   Influenza-Unspecified 10/24/2018   MMR 10/24/2018   PFIZER(Purple Top)SARS-COV-2 Vaccination 10/09/2019, 10/27/2019, 07/21/2020   PNEUMOCOCCAL CONJUGATE-20 04/13/2021   Td 10/02/2006   Varicella 10/24/2018   Zoster Recombinat (Shingrix) 04/13/2021     ROS: See pertinent positives and negatives per HPI.  Past Medical History:  Diagnosis Date   Anxiety    Cancer ([place])    skin cancer   DEPRESSION 12/02/2008   GERD 12/02/2008    no endo   Heart murmur    born with   Horseshoe kidney    simple cyst on ct  [place] 2010   HYPERGLYCEMIA, MILD  01/19/2010   HYPERTENSION 12/02/2008   Hypertension    IRON DEFICIENCY 07/18/2010   doantes blood   Muscle fasciculation 11/14/2010   Muscle twitch 11/14/2010   Left rm  Poss overuse  R/o metabolic     OBESITY 63/0/1601   SNORING 07/18/2010    Past Surgical History:  Procedure Laterality Date   COLONOSCOPY     KNEE ARTHROSCOPY WITH LATERAL MENISECTOMY Right 04/22/2015   Procedure: KNEE ARTHROSCOPY WITH LATERAL MENISECTOMY;  Surgeon: Kathryne Hitch, MD;  Location: [place];  Service: Orthopedics;  Laterality: Right;   KNEE ARTHROSCOPY WITH MEDIAL MENISECTOMY Right 04/22/2015   Procedure: RIGHT KNEE ARTHROSCOPY WITH  CHONDROPLASTY MEDIAL MENISECTOMY;  Surgeon: Kathryne Hitch, MD;  Location: [place];  Service: Orthopedics;  Laterality: Right;   KNEE CARTILAGE SURGERY  2009   Left Knee    POLYPECTOMY     SHOULDER SURGERY  1994   Left   TONSILLECTOMY  1963   TONSILLECTOMY       Current Outpatient Medications:    amLODipine (NORVASC) 10 MG tablet, Take 1 tablet (10 mg total) by mouth daily., Disp: 90 tablet, Rfl: 0   atorvastatin (LIPITOR) 20 MG tablet, TAKE 1 TABLET BY MOUTH EVERY DAY, Disp: 90 tablet, Rfl: 0   carvedilol (COREG) 25 MG tablet, Take 0.5 tablets (12.5 mg total) by mouth 2 (two) times daily with a meal., Disp: 90 tablet, Rfl: 2   erythromycin ophthalmic ointment, Place 1 Application into both eyes 3 (three) times daily., Disp: 3.5 g, Rfl: 0   escitalopram (LEXAPRO) 20 MG tablet, TAKE 1 TABLET BY MOUTH EVERY DAY,  Disp: 90 tablet, Rfl: 0   ketoconazole (NIZORAL) 2 % cream, Apply 1 application topically 2 (two) times daily., Disp: 30 g, Rfl: 1   potassium chloride SA (KLOR-CON M20) 20 MEQ tablet, Take 1 tablet (20 mEq total) by mouth daily., Disp: 90 tablet, Rfl: 0   sildenafil (REVATIO) 20 MG tablet, Take 1 tablet (20 mg total) by mouth 3 (three) times daily., Disp: 90 tablet, Rfl: 0   tamsulosin (FLOMAX) 0.4 MG CAPS capsule, Take 1 capsule (0.4  mg total) by mouth daily. Take 1 capsule daily ( Please contact Urology for further refills if still managing), Disp: 30 capsule, Rfl: 0   triamcinolone cream (KENALOG) 0.1 %, Apply 1 application topically 2 (two) times daily. As needed for itching, Disp: 30 g, Rfl: 0   valsartan-hydrochlorothiazide (DIOVAN-HCT) 320-25 MG tablet, Take 1 tablet by mouth daily., Disp: 90 tablet, Rfl: 0  EXAM:  VITALS per patient if applicable:  GENERAL: alert, oriented, appears well and in no acute distress  HEENT: atraumatic, conjunttiva with some mild erythema, no appreciable lid swelling, no appreciable drainage at the time of this visit, EOMI, PER, no obvious abnormalities on inspection of external nose and ears  NECK: normal movements of the head and neck  LUNGS: on inspection no signs of respiratory distress, breathing rate appears normal, no obvious gross SOB, gasping or wheezing  CV: no obvious cyanosis  MS: moves all visible extremities without noticeable abnormality  PSYCH/NEURO: pleasant and cooperative, no obvious depression or anxiety, speech and thought processing grossly intact  ASSESSMENT AND PLAN:  Discussed the following assessment and plan:  Eye irritation  Eye drainage  -we discussed possible serious and likely etiologies, options for evaluation and workup, limitations of telemedicine visit vs in person visit, treatment, treatment risks and precautions. Pt is agreeable to treatment via telemedicine at this moment. Query conjunctivitis and discussed viral, allergic and bacterial causes. He plans to covid test at home and contact a [facility] pharmcy for antiviral if positive. O/w compresses, artificial tears and delayed antibiotic (erythro optho) in case not improving with other measures.  Advised to seek prompt in person care if worsening, new symptoms arise, or if is not improving with treatment as expected per our conversation of expected course. Discussed options for follow up  care. Did let this patient know that I do telemedicine on Tuesdays and Thursdays for [facility] and those are the days I am logged into the system. Advised to schedule follow up visit with PCP, [facility] virtual visits or [facility] if any further questions or concerns to avoid delays in care.   I discussed the assessment and treatment plan with the patient. The patient was provided an opportunity to ask questions and all were answered. The patient agreed with the plan and demonstrated an understanding of the instructions.     Lucretia Kern, DO

## 2022-03-21 NOTE — Patient Instructions (Addendum)
-  compresses and artificial tears  -avoid rubbing eyes  -keep hands clean  -covid test and contact [facility] pharmacy if covid test is positive and you wish to do antiviral or can do follow up virtual visits if any questions  Antibiotic ointment if not improving with above. -I sent the medication we discussed to your pharmacy: Meds ordered this encounter  Medications   erythromycin ophthalmic ointment    Sig: Place 1 Application into both eyes 3 (three) times daily.    Dispense:  3.5 g    Refill:  0     I hope you are feeling better soon!  Seek in person care promptly if your symptoms worsen, new concerns arise or you are not improving with treatment.  It was nice to meet you today. I help [facility] out with telemedicine visits on Tuesdays and Thursdays and am happy to help if you need a virtual follow up visit on those days. Otherwise, if you have any concerns or questions following this visit please schedule a follow up visit with your Primary Care office or seek care at a local urgent care clinic to avoid delays in care. If you are having severe or life threatening symptoms please call 911 and/or go to the nearest emergency roo

## 2022-03-29 ENCOUNTER — Other Ambulatory Visit: Payer: Self-pay | Admitting: Internal Medicine

## 2022-04-17 ENCOUNTER — Other Ambulatory Visit: Payer: Self-pay | Admitting: Internal Medicine

## 2022-04-17 MED ORDER — AMLODIPINE BESYLATE 10 MG PO TABS: 10.0000 mg | ORAL_TABLET | Freq: Every day | ORAL | 0 refills | Status: DC

## 2022-04-17 MED ORDER — VALSARTAN-HYDROCHLOROTHIAZIDE 320-25 MG PO TABS: 1.0000 | ORAL_TABLET | Freq: Every day | ORAL | 0 refills | Status: DC

## 2022-04-17 MED ORDER — POTASSIUM CHLORIDE CRYS ER 20 MEQ PO TBCR: 20.0000 meq | EXTENDED_RELEASE_TABLET | Freq: Every day | ORAL | 0 refills | Status: DC

## 2022-04-17 MED ORDER — ESCITALOPRAM OXALATE 20 MG PO TABS: 20.0000 mg | ORAL_TABLET | Freq: Every day | ORAL | 0 refills | Status: DC

## 2022-04-18 ENCOUNTER — Encounter: Payer: Self-pay | Admitting: Family Medicine

## 2022-04-18 ENCOUNTER — Ambulatory Visit: Payer: Federal, State, Local not specified - PPO | Admitting: Family Medicine

## 2022-04-18 VITALS — BP 140/80 | HR 62 | Temp 98.5°F | Ht 67.0 in | Wt 219.5 lb

## 2022-04-18 DIAGNOSIS — J329 Chronic sinusitis, unspecified: Secondary | ICD-10-CM | POA: Diagnosis not present

## 2022-04-18 MED ORDER — AMOXICILLIN-POT CLAVULANATE 875-125 MG PO TABS: 1.0000 | ORAL_TABLET | Freq: Two times a day (BID) | ORAL | 0 refills | Status: DC

## 2022-04-18 MED ORDER — PREDNISONE 20 MG PO TABS: ORAL_TABLET | ORAL | 0 refills | Status: DC

## 2022-04-18 NOTE — Progress Notes (Signed)
Established Patient Office Visit  Subjective   Patient ID: Jaime Orr, male    DOB: [birth year]  Age: 71 y.o. MRN: [id number]  Chief Complaint  Patient presents with   Sinusitis    Patient complains of sinusitis, x3 months    Neck Pain    Patient complains of neck pain, x3 months    HPI   Here battling sinus issues for about 3 months.  He was initially treated with about 6-day course of Augmentin but did not see much improvement.  He came here in April and requested Zithromax which had worked for him in the past.  He may have seen some slight improvement but no resolution of symptoms.  At 1 point he had evidence for perforation left TM.  No ongoing eardrum issues.  Has seen ENT with good healing.  He had a virtual visit a few weeks ago for bilateral conjunctivitis.  That infection did seem to clear with antibiotic drops  He has had some ongoing sinus pressure especially frontal sinuses.  No bloody discharge.  Intermittent headaches.  Some malaise.  Also relates some neck stiffness especially early in the mornings ever since he has had the sinus issues.  Not doing any consistent saline irrigation.  No significant cough.  Past Medical History:  Diagnosis Date   Anxiety    Cancer ([place])    skin cancer   DEPRESSION 12/02/2008   GERD 12/02/2008    no endo   Heart murmur    born with   Horseshoe kidney    simple cyst on ct  [place] 2010   HYPERGLYCEMIA, MILD 01/19/2010   HYPERTENSION 12/02/2008   Hypertension    IRON DEFICIENCY 07/18/2010   doantes blood   Muscle fasciculation 11/14/2010   Muscle twitch 11/14/2010   Left rm  Poss overuse  R/o metabolic     OBESITY 95/03/2129   SNORING 07/18/2010   Past Surgical History:  Procedure Laterality Date   COLONOSCOPY     KNEE ARTHROSCOPY WITH LATERAL MENISECTOMY Right 04/22/2015   Procedure: KNEE ARTHROSCOPY WITH LATERAL MENISECTOMY;  Surgeon: Kathryne Hitch, MD;  Location: [place];  Service: Orthopedics;  Laterality:  Right;   KNEE ARTHROSCOPY WITH MEDIAL MENISECTOMY Right 04/22/2015   Procedure: RIGHT KNEE ARTHROSCOPY WITH  CHONDROPLASTY MEDIAL MENISECTOMY;  Surgeon: Kathryne Hitch, MD;  Location: [place];  Service: Orthopedics;  Laterality: Right;   KNEE CARTILAGE SURGERY  2009   Left Knee    POLYPECTOMY     SHOULDER SURGERY  1994   Left   TONSILLECTOMY  1963   TONSILLECTOMY      reports that he has quit smoking. He has never used smokeless tobacco. He reports current alcohol use of about 14.0 standard drinks of alcohol per week. He reports that he does not use drugs. family history includes Alcohol abuse in his father; Colon polyps in his father; Depression in his mother; Heart disease in his father; Hypertension in his father; Sleep apnea in his sister; Stroke in his mother. Allergies  Allergen Reactions   Codeine Other (See Comments)    REACTION: confusion   Wellbutrin [Bupropion] Anxiety    Review of Systems  Constitutional:  Positive for malaise/fatigue. Negative for chills and fever.  HENT:  Positive for congestion and sinus pain.   Respiratory:  Negative for cough and shortness of breath.   Cardiovascular:  Negative for chest pain.  Neurological:  Positive for headaches.      Objective:  BP 140/80 (BP Location: Left Arm, Patient Position: Sitting, Cuff Size: Normal)   Pulse 62   Temp 98.5 F (36.9 C) (Oral)   Ht '5\' 7"'$  (1.702 m)   Wt 219 lb 8 oz (99.6 kg)   SpO2 97%   BMI 34.38 kg/m    Physical Exam Vitals reviewed.  Constitutional:      Appearance: Normal appearance. He is not ill-appearing.  HENT:     Right Ear: Tympanic membrane normal.     Left Ear: Tympanic membrane normal.     Nose:     Comments: Slightly erythematous nasal mucosa.  No visible nasal polyps. Cardiovascular:     Rate and Rhythm: Normal rate and regular rhythm.  Pulmonary:     Effort: Pulmonary effort is normal.     Breath sounds: Normal breath sounds. No wheezing or rales.   Musculoskeletal:     Cervical back: Neck supple.  Lymphadenopathy:     Cervical: No cervical adenopathy.  Neurological:     Mental Status: He is alert.      No results found for any visits on 04/18/22.    The 10-year ASCVD risk score (Arnett DK, et al., 2019) is: 31.3%    Assessment & Plan:   Problem List Items Addressed This Visit   None Visit Diagnoses     Chronic sinusitis, unspecified location    -  Primary   Relevant Medications   amoxicillin-clavulanate (AUGMENTIN) 875-125 MG tablet   predniSONE (DELTASONE) 20 MG tablet     -Recommend full 10-day course of Augmentin -Consider prednisone 20 mg 2 tablets daily for 6 days -We did discuss possible nasal irrigation with Nettie pot with distilled water and saline -Recommend consider limited maxillofacial CT if no improvement in couple weeks  No follow-ups on file.    Carolann Littler, MD

## 2022-04-18 NOTE — Patient Instructions (Signed)
Let me know in 2 weeks if no better.    Would consider sinus imaging at that time if no better.

## 2022-05-09 IMAGING — DX DG CHEST 1V PORT
1 series · 2 of 2 positions shown · non-contrast
Comparison: Chest radiographs 10/30/2016 and earlier.

CLINICAL DATA: 70-year-old male with cough congestion and sore
throat.

EXAM:
PORTABLE CHEST 1 VIEW

[Series 1: chest ap · 0.14mm/px · 2 of 2 slices shown]
[im 1/2]
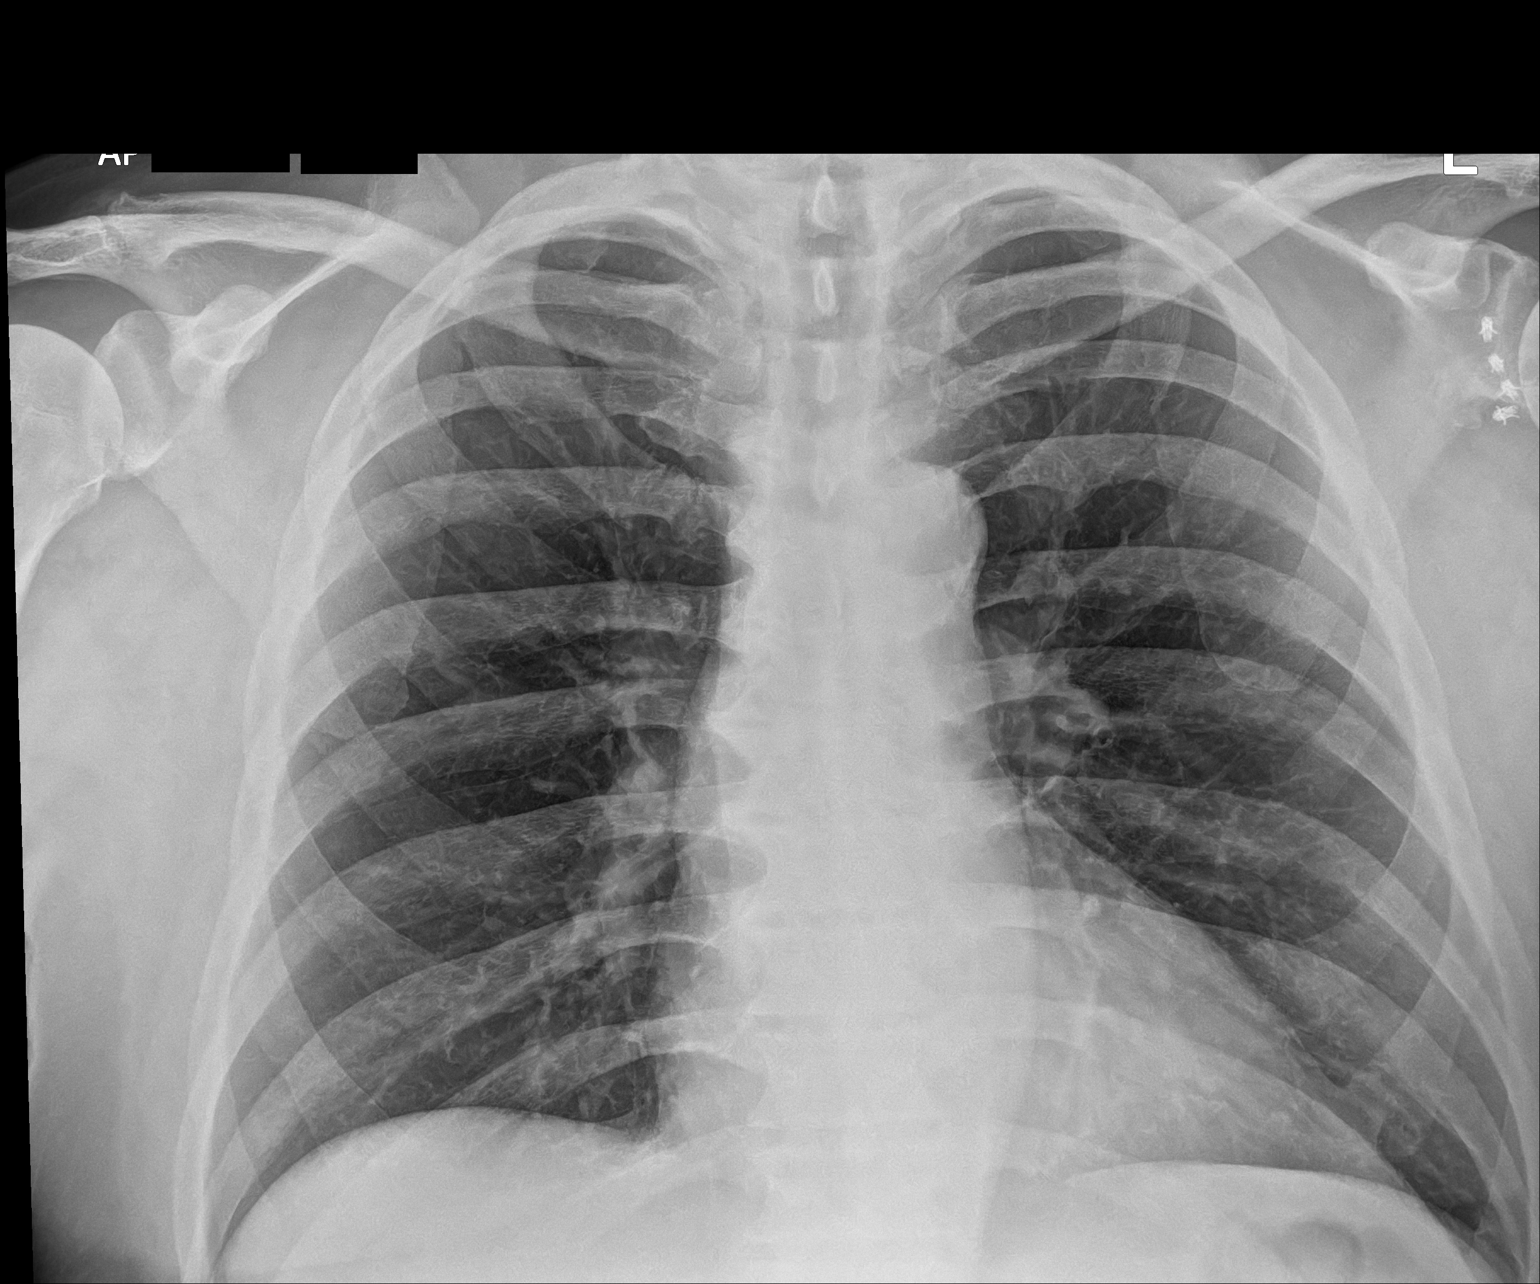
[im 2/2]
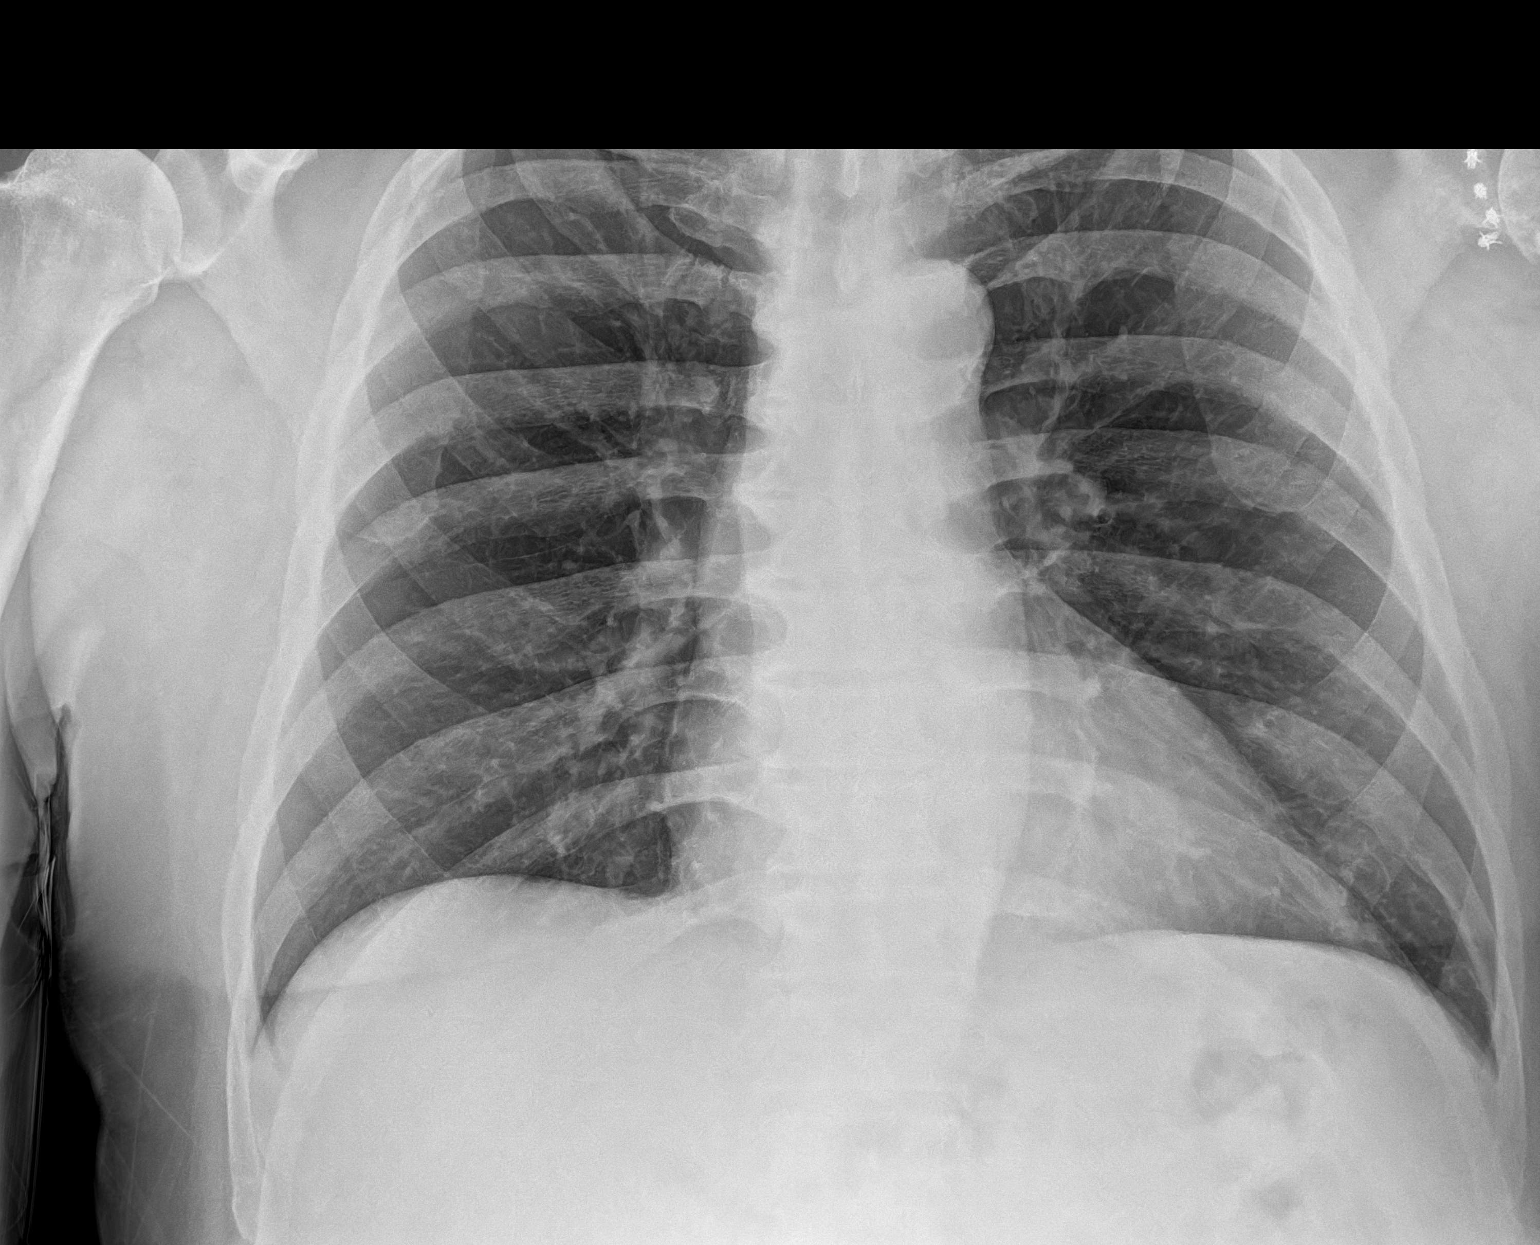

[2 of 2 positions shown; findings below may reference images not displayed]

FINDINGS: Portable AP upright view at 8222 hours. Lung volumes and mediastinal
contours remain normal. Visualized tracheal air column is within
normal limits. Allowing for portable technique the lungs are clear.
No pneumothorax or pleural effusion. Chronic postoperative changes
to the left shoulder. No acute osseous abnormality identified.
Negative visible bowel gas.
IMPRESSION: Negative portable chest.

## 2022-06-26 ENCOUNTER — Ambulatory Visit (INDEPENDENT_AMBULATORY_CARE_PROVIDER_SITE_OTHER): Payer: Federal, State, Local not specified - PPO | Admitting: Family Medicine

## 2022-06-26 VITALS — BP 138/80 | HR 79 | Temp 98.3°F | Wt 221.0 lb

## 2022-06-26 DIAGNOSIS — Z23 Encounter for immunization: Secondary | ICD-10-CM

## 2022-06-26 DIAGNOSIS — R432 Parageusia: Secondary | ICD-10-CM | POA: Diagnosis not present

## 2022-06-26 NOTE — Patient Instructions (Signed)
Consider trial of OTC allergy medication:   -nasal steroids such as Nasacort AQ or Flonase -anti-histamines such as Claritan, Allegra, or Xyzal.

## 2022-06-26 NOTE — Progress Notes (Signed)
Established Patient Office Visit  Subjective   Patient ID: Jaime Orr, male    DOB: April 03, 1951  Age: 71 y.o. MRN: [id number]  No chief complaint on file.   HPI   Patient seen with several month history of metallic taste in his mouth.  No burning sensation.  No recent change of toothpaste.  No recent change in medication.  He does have frequent somewhat chronic allergic symptoms and postnasal drip symptoms.  Does not use any mouthwashes.  His regular medications include amlodipine, atorvastatin, carvedilol, Lexapro, Flomax, Diovan HCTZ He has daily almost continuous postnasal drainage.  No recent purulent nasal discharge.  No loss of taste or smell.  Past Medical History:  Diagnosis Date   Anxiety    Cancer ([place])    skin cancer   DEPRESSION 12/02/2008   GERD 12/02/2008    no endo   Heart murmur    born with   Horseshoe kidney    simple cyst on ct  [place] 2010   HYPERGLYCEMIA, MILD 01/19/2010   HYPERTENSION 12/02/2008   Hypertension    IRON DEFICIENCY 07/18/2010   doantes blood   Muscle fasciculation 11/14/2010   Muscle twitch 11/14/2010   Left rm  Poss overuse  R/o metabolic     OBESITY 81/05/2992   SNORING 07/18/2010   Past Surgical History:  Procedure Laterality Date   COLONOSCOPY     KNEE ARTHROSCOPY WITH LATERAL MENISECTOMY Right 04/22/2015   Procedure: KNEE ARTHROSCOPY WITH LATERAL MENISECTOMY;  Surgeon: Kathryne Hitch, MD;  Location: [place];  Service: Orthopedics;  Laterality: Right;   KNEE ARTHROSCOPY WITH MEDIAL MENISECTOMY Right 04/22/2015   Procedure: RIGHT KNEE ARTHROSCOPY WITH  CHONDROPLASTY MEDIAL MENISECTOMY;  Surgeon: Kathryne Hitch, MD;  Location: [place];  Service: Orthopedics;  Laterality: Right;   KNEE CARTILAGE SURGERY  2009   Left Knee    POLYPECTOMY     SHOULDER SURGERY  1994   Left   TONSILLECTOMY  1963   TONSILLECTOMY      reports that he has quit smoking. He has never used smokeless tobacco. He reports current  alcohol use of about 14.0 standard drinks of alcohol per week. He reports that he does not use drugs. family history includes Alcohol abuse in his father; Colon polyps in his father; Depression in his mother; Heart disease in his father; Hypertension in his father; Sleep apnea in his sister; Stroke in his mother. Allergies  Allergen Reactions   Codeine Other (See Comments)    REACTION: confusion   Wellbutrin [Bupropion] Anxiety    Review of Systems  Constitutional:  Negative for chills and fever.  HENT:  Positive for congestion. Negative for sinus pain and sore throat.   Respiratory:  Negative for cough.       Objective:     BP 138/80 (BP Location: Left Arm, Patient Position: Sitting, Cuff Size: Large)   Pulse 79   Temp 98.3 F (36.8 C) (Oral)   Wt 221 lb (100.2 kg)   SpO2 95%   BMI 34.61 kg/m    Physical Exam Vitals reviewed.  Constitutional:      Appearance: Normal appearance.  HENT:     Right Ear: Tympanic membrane normal.     Left Ear: Tympanic membrane normal.     Mouth/Throat:     Comments: Oropharynx is clear.  Mucosa appears moist.  No thrush.  No intraoral lesions noted. Cardiovascular:     Rate and Rhythm: Normal rate and regular rhythm.  Pulmonary:  Effort: Pulmonary effort is normal.     Breath sounds: Normal breath sounds.  Neurological:     Mental Status: He is alert.      No results found for any visits on 06/26/22.    The 10-year ASCVD risk score (Arnett DK, et al., 2019) is: 32.9%    Assessment & Plan:   Dysgeusia.  Patient relates metallic taste for several months.  No obvious triggering factors.  No change in medication.  Does have frequent daily postnasal drip and he thinks this may be related.  We have suggested he step up allergy therapy with over-the-counter antihistamine such as Claritin or Allegra and also consider daily nasal steroid such as Flonase or Nasacort AQ.  Consider B12 level with next labs if not improving with the  above   No follow-ups on file.    Carolann Littler, MD

## 2022-06-27 ENCOUNTER — Other Ambulatory Visit: Payer: Self-pay | Admitting: Internal Medicine

## 2022-06-28 ENCOUNTER — Telehealth: Payer: Self-pay | Admitting: Internal Medicine

## 2022-06-28 NOTE — Telephone Encounter (Signed)
error 

## 2022-07-12 ENCOUNTER — Other Ambulatory Visit: Payer: Self-pay | Admitting: Internal Medicine

## 2022-07-13 ENCOUNTER — Other Ambulatory Visit: Payer: Self-pay | Admitting: Internal Medicine

## 2022-07-14 ENCOUNTER — Other Ambulatory Visit: Payer: Self-pay | Admitting: Internal Medicine

## 2022-07-14 DIAGNOSIS — E785 Hyperlipidemia, unspecified: Secondary | ICD-10-CM

## 2022-07-14 DIAGNOSIS — R739 Hyperglycemia, unspecified: Secondary | ICD-10-CM

## 2022-07-14 DIAGNOSIS — I10 Essential (primary) hypertension: Secondary | ICD-10-CM

## 2022-07-14 DIAGNOSIS — Z0000 Encounter for general adult medical examination without abnormal findings: Secondary | ICD-10-CM

## 2022-07-14 DIAGNOSIS — Z79899 Other long term (current) drug therapy: Secondary | ICD-10-CM

## 2022-07-14 DIAGNOSIS — Z125 Encounter for screening for malignant neoplasm of prostate: Secondary | ICD-10-CM

## 2022-07-14 MED ORDER — VALSARTAN-HYDROCHLOROTHIAZIDE 320-25 MG PO TABS: 1.0000 | ORAL_TABLET | Freq: Every day | ORAL | 0 refills | Status: DC

## 2022-07-14 MED ORDER — POTASSIUM CHLORIDE CRYS ER 20 MEQ PO TBCR: 20.0000 meq | EXTENDED_RELEASE_TABLET | Freq: Every day | ORAL | 0 refills | Status: DC

## 2022-07-14 MED ORDER — ESCITALOPRAM OXALATE 20 MG PO TABS: 20.0000 mg | ORAL_TABLET | Freq: Every day | ORAL | 0 refills | Status: DC

## 2022-07-15 NOTE — Telephone Encounter (Signed)
This is confusing   Confirm if  he is taking amlodipine 10   I dont see that he stopped it . If so refill 90 days

## 2022-07-17 ENCOUNTER — Encounter: Payer: Self-pay | Admitting: Internal Medicine

## 2022-07-17 ENCOUNTER — Other Ambulatory Visit: Payer: Self-pay | Admitting: Internal Medicine

## 2022-07-17 NOTE — Telephone Encounter (Signed)
Also requesting valsartan-hydrochlorothiazide (DIOVAN-HCT) 320-25 MG tablet, potassium chloride SA (KLOR-CON M20) 20 MEQ tablet

## 2022-07-18 MED ORDER — VALSARTAN-HYDROCHLOROTHIAZIDE 320-25 MG PO TABS: 1.0000 | ORAL_TABLET | Freq: Every day | ORAL | 0 refills | Status: DC

## 2022-07-18 MED ORDER — AMLODIPINE BESYLATE 10 MG PO TABS: 10.0000 mg | ORAL_TABLET | Freq: Every day | ORAL | 0 refills | Status: DC

## 2022-07-18 NOTE — Telephone Encounter (Signed)
Pt states he is open to having earlier CPE. Last CPE 04/2021, CPE rescheduled for 07/25/22. Meds will be refilled.

## 2022-07-19 ENCOUNTER — Telehealth: Payer: Self-pay | Admitting: Internal Medicine

## 2022-07-19 ENCOUNTER — Other Ambulatory Visit: Payer: Self-pay | Admitting: Internal Medicine

## 2022-07-19 NOTE — Telephone Encounter (Addendum)
Patient states he does not want these to go thru mail order. Wanted them to go [id number] [place], [place]  patient requests a call

## 2022-07-19 NOTE — Telephone Encounter (Signed)
Pt is calling back and stated he is out of his medication and have a cpe appt on 07/25/22.

## 2022-07-20 ENCOUNTER — Other Ambulatory Visit: Payer: Self-pay

## 2022-07-20 MED ORDER — AMLODIPINE BESYLATE 10 MG PO TABS: 10.0000 mg | ORAL_TABLET | Freq: Every day | ORAL | 0 refills | Status: DC

## 2022-07-20 NOTE — Telephone Encounter (Signed)
Spoke to patient. 30 days prescription sent for Amlodipine.

## 2022-07-20 NOTE — Addendum Note (Signed)
Addended byShanon Ace K on: 07/20/2022 02:21 PM   Modules accepted: Orders

## 2022-07-20 NOTE — Telephone Encounter (Signed)
Pt calling back checking on progress of this refill, states he is out. Patient has cpe scheduled for 07/25/22

## 2022-07-21 NOTE — Telephone Encounter (Signed)
Spoke to patient. 30 days supply was sent.

## 2022-07-24 NOTE — Progress Notes (Signed)
Chief Complaint  Patient presents with   Annual Exam    HPI: Patient  Jaime Orr  71 y.o. comes in today for Preventive Health Care visit  and over due for labs and med check   Bp  takes med all at home time so not getting the  evening carvedilol  bp had been in 138/80 range  but hasnt checked in a while  on amlodipine 10 valsrtan hctz and carvedilol  and potassium supplement.   Only taking  carvedilol  in am and not bid  HLD on atorvastatin GU  sees uro flomax for bph still get urgency   Metal taste in mouth  saw dr Elease Hashimoto  rx for allergy as a trial Had om with rupture on April rx w augmentin  Interim hx  syncope loc after severe coughing episode unwitnessed felt from viral infection  no recurrance  had ear infections  uses hearing assistance . Gets sinus sx at times takes allegra.  Taking b12 incase  .    Health Maintenance  Topic Date Due   FOOT EXAM  Never done   OPHTHALMOLOGY EXAM  Never done   Hepatitis C Screening  Never done   Diabetic kidney evaluation - Urine ACR  07/09/2011   Zoster Vaccines- Shingrix (2 of 2) 06/08/2021   COVID-19 Vaccine (4 - Pfizer risk series) 08/10/2022 (Originally 09/15/2020)   TETANUS/TDAP  03/22/2023 (Originally 10/02/2016)   HEMOGLOBIN A1C  01/24/2023   Diabetic kidney evaluation - GFR measurement  07/26/2023   COLONOSCOPY (Pts 45-59yrs Insurance coverage will need to be confirmed)  05/17/2026   Pneumonia Vaccine 43+ Years old  Completed   INFLUENZA VACCINE  Completed   HPV VACCINES  Aged Out   Health Maintenance Review LIFESTYLE:  Exercise:  aactive 3 jobs  Tobacco/ETS:no Alcohol:  3 per day Sugar beverages: Sleep:good 9 hours  Drug use: no HH of 2 Work:3 jobs 30 hours per week   No suppl will take allegra for sinuss   ROS:  GEN/ HEENT: No fever, significant weight changes sweats headaches vision problems hearing changes, CV/ PULM; No chest pain shortness of breath cough,edema  change in exercise tolerance. GI /GU: No  adominal pain, vomiting, change in bowel habits. No blood in the stool. No significant GU symptoms. SKIN/HEME: ,no acute skin rashes suspicious lesions or bleeding. No lymphadenopathy, nodules, masses.  NEURO/ PSYCH:  No neurologic signs such as weakness numbness. Mood ok on lexapro  IMM/ Allergy: No unusual infections.  Allergy .   REST of 12 system review negative except as per HPI   Past Medical History:  Diagnosis Date   Anxiety    Cancer ([place])    skin cancer   DEPRESSION 12/02/2008   GERD 12/02/2008    no endo   Heart murmur    born with   Horseshoe kidney    simple cyst on ct  [place] 2010   HYPERGLYCEMIA, MILD 01/19/2010   HYPERTENSION 12/02/2008   Hypertension    IRON DEFICIENCY 07/18/2010   doantes blood   Muscle fasciculation 11/14/2010   Muscle twitch 11/14/2010   Left rm  Poss overuse  R/o metabolic     OBESITY 31/01/1760   SNORING 07/18/2010    Past Surgical History:  Procedure Laterality Date   COLONOSCOPY     KNEE ARTHROSCOPY WITH LATERAL MENISECTOMY Right 04/22/2015   Procedure: KNEE ARTHROSCOPY WITH LATERAL MENISECTOMY;  Surgeon: Kathryne Hitch, MD;  Location: [facility];  Service: Orthopedics;  Laterality: Right;  KNEE ARTHROSCOPY WITH MEDIAL MENISECTOMY Right 04/22/2015   Procedure: RIGHT KNEE ARTHROSCOPY WITH  CHONDROPLASTY MEDIAL MENISECTOMY;  Surgeon: Kathryne Hitch, MD;  Location: [facility];  Service: Orthopedics;  Laterality: Right;   KNEE CARTILAGE SURGERY  2009   Left Knee    POLYPECTOMY     SHOULDER SURGERY  1994   Left   TONSILLECTOMY  1963   TONSILLECTOMY      Family History  Problem Relation Age of Onset   Stroke Mother        52   Depression Mother    Hypertension Father    Heart disease Father        49   Alcohol abuse Father    Colon polyps Father    Sleep apnea Sister    Colon cancer Neg Hx    Esophageal cancer Neg Hx    Rectal cancer Neg Hx    Stomach cancer Neg Hx     Social History   Socioeconomic  History   Marital status: Married    Spouse name: Not on file   Number of children: Not on file   Years of education: Not on file   Highest education level: Not on file  Occupational History   Not on file  Tobacco Use   Smoking status: Former   Smokeless tobacco: Never  Vaping Use   Vaping Use: Never used  Substance and Sexual Activity   Alcohol use: Yes    Alcohol/week: 14.0 standard drinks of alcohol    Types: 14 Cans of beer per week    Comment: daily 2-3 beers/day   Drug use: No   Sexual activity: Not on file  Other Topics Concern   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   Originally from [place]  To [place] of 2 dogs and cats    Donates blood      Social Determinants of Radio broadcast assistant Strain: Not on file  Food Insecurity: Not on file  Transportation Needs: Not on file  Physical Activity: Not on file  Stress: Not on file  Social Connections: Not on file    Outpatient Medications Prior to Visit  Medication Sig Dispense Refill   atorvastatin (LIPITOR) 20 MG tablet TAKE 1 TABLET BY MOUTH EVERY DAY 90 tablet 0   carvedilol (COREG) 25 MG tablet Take 25 mg by mouth 2 (two) times daily with a meal.     escitalopram (LEXAPRO) 20 MG tablet Take 1 tablet (20 mg total) by mouth daily. 90 tablet 0   potassium chloride SA (KLOR-CON M20) 20 MEQ tablet Take 1 tablet (20 mEq total) by mouth daily. 90 tablet 0   tamsulosin (FLOMAX) 0.4 MG CAPS capsule Take 1 capsule (0.4 mg total) by mouth daily. Take 1 capsule daily ( Please contact Urology for further refills if still managing) 30 capsule 0   valsartan-hydrochlorothiazide (DIOVAN-HCT) 320-25 MG tablet Take 1 tablet by mouth daily. KEEP APPT WITH PROVIDER FOR FUTURE REFILLS. 30 tablet 0   amLODipine  (NORVASC) 10 MG tablet Take 1 tablet (10 mg total) by mouth daily. KEEP APPT WITH PROVIDER FOR FUTURE REFILLS. 30 tablet 0   cyanocobalamin (VITAMIN B12) 1000 MCG tablet  Take 1,000 mcg by mouth daily. (Patient not taking: Reported on 07/25/2022)     No facility-administered medications prior to visit.     EXAM:  BP (!) 152/84 (BP Location: Right Arm, Cuff Size: Large)   Pulse (!) 56   Temp 97.6 F (36.4 C) (Oral)   Ht 5' 7.75" (1.721 m)   Wt 224 lb 3.2 oz (101.7 kg)   SpO2 98%   BMI 34.34 kg/m   Body mass index is 34.34 kg/m. Wt Readings from Last 3 Encounters:  07/25/22 224 lb 3.2 oz (101.7 kg)  06/26/22 221 lb (100.2 kg)  04/18/22 219 lb 8 oz (99.6 kg)    Physical Exam: Vital signs reviewed WLN:LGXQ is a well-developed well-nourished alert cooperative    who appearsr stated age in no acute distress.  HEENT: normocephalic atraumatic , Eyes: PERRL EOM's full, conjunctiva clear, Nares: paten,t no deformity discharge or tenderness., Ears: no deformity EAC's clear TMs with normal landmarks. Mouth: clear OP, no lesions, edema.  Moist mucous membranes. Dentition in adequate repair. NECK: supple without masses, thyromegaly or bruits. CHEST/PULM:  Clear to auscultation and percussion breath sounds equal no wheeze , rales or rhonchi. No chest wall deformities or tenderness. CV: PMI is nondisplaced, S1 S2 no gallops, murmurs, rubs. Peripheral pulses are full without delay.No JVD .  ABDOMEN: Bowel sounds normal nontender  No guard or rebound, no hepato splenomegal no CVA tenderness.  No hernia. Extremtities:  No clubbing cyanosis or edema, no acute joint swelling or redness no focal atrophy NEURO:  Oriented x3, cranial nerves 3-12 appear to be intact, no obvious focal weakness,gait within normal limits no abnormal reflexes or asymmetrical SKIN: No acute rashes normal turgor, color, no bruising or petechiae. PSYCH: Oriented, good eye contact, no obvious depression anxiety, cognition and  judgment appear normal. LN: no cervical axillary inguinal adenopathy  Lab Results  Component Value Date   WBC 5.5 07/25/2022   HGB 14.4 07/25/2022   HCT 42.1 07/25/2022   PLT 233.0 07/25/2022   GLUCOSE 112 (H) 07/25/2022   CHOL 139 07/25/2022   TRIG 91.0 07/25/2022   HDL 45.00 07/25/2022   LDLDIRECT 75.0 03/11/2018   LDLCALC 75 07/25/2022   ALT 18 07/25/2022   AST 16 07/25/2022   NA 140 07/25/2022   K 3.9 07/25/2022   CL 101 07/25/2022   CREATININE 0.84 07/25/2022   BUN 16 07/25/2022   CO2 32 07/25/2022   TSH 3.47 07/25/2022   PSA 1.85 07/25/2022   HGBA1C 5.7 07/25/2022   MICROALBUR 1.7 07/08/2010    BP Readings from Last 3 Encounters:  07/25/22 (!) 152/84  06/26/22 138/80  04/18/22 140/80    Lab  plan reviewed with patient  in system   ASSESSMENT AND PLAN:  Discussed the following assessment and plan:    ICD-10-CM   1. Visit for preventive health examination  Z00.00     2. Essential hypertension  I10 TSH    PSA    Lipid panel    Hepatic function panel    Hemoglobin A1c    CBC with Differential/Platelet    Basic metabolic panel    3. Gastroesophageal reflux disease, unspecified whether esophagitis present  K21.9     4. Horseshoe kidney  Q63.1     5. Fasting hyperglycemia  R73.01     6. Medication management  Z79.899 TSH    PSA    Lipid panel    Hepatic function panel    Hemoglobin A1c    CBC with  Differential/Platelet    Basic metabolic panel    7. Hyperlipidemia, unspecified hyperlipidemia type  E78.5 TSH    PSA    Lipid panel    Hepatic function panel    Hemoglobin A1c    CBC with Differential/Platelet    Basic metabolic panel    8. Hyperglycemia  R73.9 TSH    PSA    Lipid panel    Hepatic function panel    Hemoglobin A1c    CBC with Differential/Platelet    Basic metabolic panel    9. Screening PSA (prostate specific antigen)  Z12.5 PSA      He is not diabetic  but flagged in system so not correct  Not sure why   Bp seems  resistant but only taking carvedilol qd  Get [place] some readings after adherence   to document  need for change in meds or continue of current plan  Consider change  spironolactone ( stop k etc )  consider bystolic instead of coreg , other . Consider advance Ht clinic  but patient currently prefers  to stay with current pcp management and fu . Return for bp readings  and virtual visit in a month for bp evaluation.  Patient Care Team: Moana Munford, Standley Brooking, MD as PCP - General Ninetta Lights, MD (Inactive) (Orthopedic Surgery) Inda Castle, MD (Inactive) (Gastroenterology) skin center dermatology Patient Instructions  Good  to see you today  Bp too high . Take the coreg twice a day  Let me evaluate labs  We may change medication    Consider hypertension clinic.   Ask urology about meds . Take blood pressure readings twice a day for5-7  days and then periodically .To ensure below 140/90   .Send in readings     on my chart  Plan fu depending . We can do a virtual visit in follow up. About bp  .   Immunization History  Administered Date(s) Administered   Fluad Quad(high Dose 65+) 06/26/2022   Influenza Split 08/14/2011, 08/05/2012   Influenza Whole 07/18/2010   Influenza,inj,Quad PF,6+ Mos 06/21/2015, 06/16/2016, 11/12/2017   Influenza-Unspecified 10/24/2018   MMR 10/24/2018   PFIZER(Purple Top)SARS-COV-2 Vaccination 10/09/2019, 10/27/2019, 07/21/2020   PNEUMOCOCCAL CONJUGATE-20 04/13/2021   Td 10/02/2006   Varicella 10/24/2018   Zoster Recombinat (Shingrix) 04/13/2021     Standley Brooking. Mavis Gravelle M.D.

## 2022-07-25 ENCOUNTER — Encounter: Payer: Self-pay | Admitting: Internal Medicine

## 2022-07-25 ENCOUNTER — Ambulatory Visit (INDEPENDENT_AMBULATORY_CARE_PROVIDER_SITE_OTHER): Payer: Federal, State, Local not specified - PPO | Admitting: Internal Medicine

## 2022-07-25 VITALS — BP 152/84 | HR 56 | Temp 97.6°F | Ht 67.75 in | Wt 224.2 lb

## 2022-07-25 DIAGNOSIS — Z79899 Other long term (current) drug therapy: Secondary | ICD-10-CM | POA: Diagnosis not present

## 2022-07-25 DIAGNOSIS — Z125 Encounter for screening for malignant neoplasm of prostate: Secondary | ICD-10-CM | POA: Diagnosis not present

## 2022-07-25 DIAGNOSIS — Z0000 Encounter for general adult medical examination without abnormal findings: Secondary | ICD-10-CM

## 2022-07-25 DIAGNOSIS — R7301 Impaired fasting glucose: Secondary | ICD-10-CM | POA: Diagnosis not present

## 2022-07-25 DIAGNOSIS — E785 Hyperlipidemia, unspecified: Secondary | ICD-10-CM | POA: Diagnosis not present

## 2022-07-25 DIAGNOSIS — K219 Gastro-esophageal reflux disease without esophagitis: Secondary | ICD-10-CM | POA: Diagnosis not present

## 2022-07-25 DIAGNOSIS — Q631 Lobulated, fused and horseshoe kidney: Secondary | ICD-10-CM

## 2022-07-25 DIAGNOSIS — I10 Essential (primary) hypertension: Secondary | ICD-10-CM | POA: Diagnosis not present

## 2022-07-25 DIAGNOSIS — R739 Hyperglycemia, unspecified: Secondary | ICD-10-CM

## 2022-07-25 LAB — BASIC METABOLIC PANEL
BUN: 16 mg/dL (ref 6–23)
CO2: 32 mEq/L (ref 19–32)
Calcium: 9 mg/dL (ref 8.4–10.5)
Chloride: 101 mEq/L (ref 96–112)
Creatinine, Ser: 0.84 mg/dL (ref 0.40–1.50)
GFR: 89.25 mL/min (ref >60.00)
Glucose, Bld: 112 mg/dL — ABNORMAL HIGH (ref 70–99)
Potassium: 3.9 mEq/L (ref 3.5–5.1)
Sodium: 140 mEq/L (ref 135–145)

## 2022-07-25 LAB — CBC WITH DIFFERENTIAL/PLATELET
Basophils Absolute: 0.1 10*3/uL (ref 0.0–0.1)
Basophils Relative: 1.6 % (ref 0.0–3.0)
Eosinophils Absolute: 0.3 10*3/uL (ref 0.0–0.7)
Eosinophils Relative: 4.9 % (ref 0.0–5.0)
HCT: 42.1 % (ref 39.0–52.0)
Hemoglobin: 14.4 g/dL (ref 13.0–17.0)
Lymphocytes Relative: 30.5 % (ref 12.0–46.0)
Lymphs Abs: 1.7 10*3/uL (ref 0.7–4.0)
MCHC: 34.1 g/dL (ref 30.0–36.0)
MCV: 90.1 fl (ref 78.0–100.0)
Monocytes Absolute: 0.5 10*3/uL (ref 0.1–1.0)
Monocytes Relative: 10 % (ref 3.0–12.0)
Neutro Abs: 2.9 10*3/uL (ref 1.4–7.7)
Neutrophils Relative %: 53 % (ref 43.0–77.0)
Platelets: 233 10*3/uL (ref 150.0–400.0)
RBC: 4.68 Mil/uL (ref 4.22–5.81)
RDW: 13.6 % (ref 11.5–15.5)
WBC: 5.5 10*3/uL (ref 4.0–10.5)

## 2022-07-25 LAB — LIPID PANEL
Cholesterol: 139 mg/dL (ref 0–200)
HDL: 45 mg/dL (ref >39.00)
LDL Cholesterol: 75 mg/dL (ref 0–99)
NonHDL: 93.55
Total CHOL/HDL Ratio: 3
Triglycerides: 91 mg/dL (ref 0.0–149.0)
VLDL: 18.2 mg/dL (ref 0.0–40.0)

## 2022-07-25 LAB — HEPATIC FUNCTION PANEL
ALT: 18 U/L (ref 0–53)
AST: 16 U/L (ref 0–37)
Albumin: 4 g/dL (ref 3.5–5.2)
Alkaline Phosphatase: 55 U/L (ref 39–117)
Bilirubin, Direct: 0.1 mg/dL (ref 0.0–0.3)
Total Bilirubin: 0.5 mg/dL (ref 0.2–1.2)
Total Protein: 6.6 g/dL (ref 6.0–8.3)

## 2022-07-25 LAB — HEMOGLOBIN A1C: Hgb A1c MFr Bld: 5.7 % (ref 4.6–6.5)

## 2022-07-25 LAB — TSH: TSH: 3.47 u[IU]/mL (ref 0.35–5.50)

## 2022-07-25 LAB — PSA: PSA: 1.85 ng/mL (ref 0.10–4.00)

## 2022-07-25 MED ORDER — AMLODIPINE BESYLATE 10 MG PO TABS: 10.0000 mg | ORAL_TABLET | Freq: Every day | ORAL | 3 refills | Status: DC

## 2022-07-25 NOTE — Assessment & Plan Note (Signed)
Only taking coreg in am  But still has 4 med and seems resistant  Plus some etoh effect. Consider   HT clinic but patient prefers readjusting eds at this time . Consider spironolactone . Consider  bystolic  Instead of coreg iif indicated for adherence measures.   To get [place] readings    5-7 days  Plan 1 mos fu vurtual about  Bp and go from there .

## 2022-07-25 NOTE — Patient Instructions (Addendum)
Good  to see you today  Bp too high . Take the coreg twice a day  Let me evaluate labs  We may change medication    Consider hypertension clinic.   Ask urology about meds . Take blood pressure readings twice a day for5-7  days and then periodically .To ensure below 140/90   .Send in readings     on my chart  Plan fu depending . We can do a virtual visit in follow up. About bp  .   Immunization History  Administered Date(s) Administered   Fluad Quad(high Dose 65+) 06/26/2022   Influenza Split 08/14/2011, 08/05/2012   Influenza Whole 07/18/2010   Influenza,inj,Quad PF,6+ Mos 06/21/2015, 06/16/2016, 11/12/2017   Influenza-Unspecified 10/24/2018   MMR 10/24/2018   PFIZER(Purple Top)SARS-COV-2 Vaccination 10/09/2019, 10/27/2019, 07/21/2020   PNEUMOCOCCAL CONJUGATE-20 04/13/2021   Td 10/02/2006   Varicella 10/24/2018   Zoster Recombinat (Shingrix) 04/13/2021

## 2022-08-04 ENCOUNTER — Other Ambulatory Visit: Payer: Self-pay | Admitting: Internal Medicine

## 2022-08-06 NOTE — Progress Notes (Signed)
Results are stable of normal except blood sugar   No diabetes.  Keep fu plan for BP monitoring

## 2022-08-06 NOTE — Telephone Encounter (Signed)
At last visit  should be taking carvedilol 25 mg twice a day noted on  last  visit  see notes and refills :  correct  med list to carvedilol 25 mg 1 po bid disp 90 day   contact patient  (I just saw him and  confirm )

## 2022-08-08 ENCOUNTER — Telehealth: Payer: Self-pay

## 2022-08-08 MED ORDER — AMLODIPINE BESYLATE 10 MG PO TABS: 10.0000 mg | ORAL_TABLET | Freq: Every day | ORAL | 3 refills | Status: DC

## 2022-08-08 NOTE — Telephone Encounter (Signed)
Spoke to patient. He reports he doesn't need the refill on Carvedilol but he does need a refill on [facility]mlodipine to [facility]. Will send it in. Removed  [facility] prefer pharmacy per pt request.   Correct dosage for carvedilol is already update.

## 2022-08-09 NOTE — Telephone Encounter (Addendum)
Pt is calling and he need a early refill on amlodipine he need to refill on 08-15-2022 due to he is going out of town its not due for refill until 08-16-2022. Please call pharm to get early refill if approve

## 2022-08-10 ENCOUNTER — Telehealth: Payer: Self-pay | Admitting: Internal Medicine

## 2022-08-10 NOTE — Telephone Encounter (Signed)
Error Please Disregard

## 2022-08-10 NOTE — Telephone Encounter (Signed)
Contact the pharmacy and spoke [place]. She override it so patient can pick it up tomorrow.  Spoke to patient. He states he doesn't have enough to last him for his trip. Inform he can pick up his med tomorrow.

## 2022-08-10 NOTE — Telephone Encounter (Signed)
Pt states he needs a 90 day supply of the:   amLODipine (NORVASC) 10 MG tablet   Pt will be leaving town on 08/14/22 and really needs to pick up this Rx before he leaves.  LOV:  07/25/22  [id number] [address] Phone: [phone] Fax: [phone]

## 2022-09-27 ENCOUNTER — Encounter: Payer: Federal, State, Local not specified - PPO | Admitting: Internal Medicine

## 2022-09-29 ENCOUNTER — Other Ambulatory Visit: Payer: Self-pay | Admitting: Internal Medicine

## 2022-10-07 ENCOUNTER — Other Ambulatory Visit: Payer: Self-pay | Admitting: Internal Medicine

## 2022-10-12 ENCOUNTER — Telehealth: Payer: Self-pay | Admitting: Internal Medicine

## 2022-10-12 ENCOUNTER — Other Ambulatory Visit: Payer: Self-pay | Admitting: Internal Medicine

## 2022-10-12 MED ORDER — POTASSIUM CHLORIDE CRYS ER 20 MEQ PO TBCR: 20.0000 meq | EXTENDED_RELEASE_TABLET | Freq: Every day | ORAL | 0 refills | Status: DC

## 2022-10-12 MED ORDER — VALSARTAN-HYDROCHLOROTHIAZIDE 320-25 MG PO TABS: 1.0000 | ORAL_TABLET | Freq: Every day | ORAL | 0 refills | Status: DC

## 2022-10-12 NOTE — Telephone Encounter (Signed)
Pt is calling and had a cpe in oct 2023 and would like #90 with refills on potassium chloride SA (KLOR-CON M20) 20 MEQ tablet  and valsartan-hydrochlorothiazide (DIOVAN-HCT) 320-25 MG tablet  [id number] [facility] [address] Phone: [phone] Fax: [phone]

## 2022-10-13 MED ORDER — POTASSIUM CHLORIDE CRYS ER 20 MEQ PO TBCR: 20.0000 meq | EXTENDED_RELEASE_TABLET | Freq: Every day | ORAL | 2 refills | Status: DC

## 2022-10-13 MED ORDER — VALSARTAN-HYDROCHLOROTHIAZIDE 320-25 MG PO TABS: 1.0000 | ORAL_TABLET | Freq: Every day | ORAL | 2 refills | Status: DC

## 2022-10-13 NOTE — Addendum Note (Signed)
Addended by: Rodrigo Ran on: 10/13/2022 02:44 PM   Modules accepted: Orders

## 2022-10-13 NOTE — Telephone Encounter (Signed)
Patient calling says he had his cpe, shows it was completed on 07/25/22 and is looking to get a 90d supply as opposed to the 30 that sent in.  [id number] [place] [phone] Fax: [phone]

## 2022-11-17 ENCOUNTER — Other Ambulatory Visit: Payer: Self-pay | Admitting: Internal Medicine

## 2022-11-21 ENCOUNTER — Encounter: Payer: Self-pay | Admitting: Internal Medicine

## 2022-11-21 NOTE — Telephone Encounter (Signed)
So confusing and need clarification Not sure how  this  prescription  became incorrect   At Last check  you were  taking 25 mg per day and preferred you take 1/2 twice a day .  Disp 90   (Also  need some bp readings  sent in as in past)   Please send in rx that says 25 mg carvedilol take 1/2 po bid ( 25 mg per day ) disp 90 refill x 1

## 2022-11-22 ENCOUNTER — Telehealth: Payer: Self-pay | Admitting: Internal Medicine

## 2022-11-22 MED ORDER — CARVEDILOL 25 MG PO TABS: ORAL_TABLET | ORAL | 1 refills | Status: DC

## 2022-11-22 NOTE — Telephone Encounter (Signed)
Pt called to say he noticed that 90 tablets of the below mentioned Rx was ordered today, but he just picked up a refill "the other day" -  carvedilol (COREG) 25 MG tablet   Pt states it is too soon.  Please advise.   [id number] [place] [place] [phone] Fax: [phone]

## 2022-11-24 NOTE — Telephone Encounter (Signed)
Spoke with patient.  He stated that he received 45 tablets.   Requested for future prescription to be sent for 90 day supply

## 2022-12-14 ENCOUNTER — Encounter: Payer: Self-pay | Admitting: Internal Medicine

## 2023-01-10 MED ORDER — CARVEDILOL 25 MG PO TABS: 12.5000 mg | ORAL_TABLET | Freq: Two times a day (BID) | ORAL | 1 refills | Status: DC

## 2023-03-24 ENCOUNTER — Other Ambulatory Visit: Payer: Self-pay | Admitting: Internal Medicine

## 2023-04-05 ENCOUNTER — Other Ambulatory Visit: Payer: Self-pay | Admitting: Internal Medicine

## 2023-04-12 ENCOUNTER — Telehealth: Payer: Self-pay | Admitting: Internal Medicine

## 2023-04-12 ENCOUNTER — Encounter: Payer: Self-pay | Admitting: Internal Medicine

## 2023-04-12 MED ORDER — CARVEDILOL 25 MG PO TABS: 12.5000 mg | ORAL_TABLET | Freq: Two times a day (BID) | ORAL | 0 refills | Status: DC

## 2023-04-12 NOTE — Telephone Encounter (Signed)
Prescription Request  04/12/2023  LOV: 07/25/2022  What is the name of the medication or equipment?  carvedilol (COREG) 25 MG tablet  Pt states he only received 45 days worth of this Rx, but should have been given 90 days worth. Pt would like another Rx for the remaining 45 days worth of pills.   Have you contacted your pharmacy to request a refill? Yes   Which pharmacy would you like this sent to?  [id number] [place] [place] - [address] [place] [address] Phone: [phone] Fax: [phone]    Patient notified that their request is being sent to the clinical staff for review and that they should receive a response within 2 business days.   Please advise at Mobile [phone] (mobile)

## 2023-04-12 NOTE — Telephone Encounter (Signed)
Rx sent 

## 2023-07-01 ENCOUNTER — Other Ambulatory Visit: Payer: Self-pay | Admitting: Internal Medicine

## 2023-08-08 ENCOUNTER — Other Ambulatory Visit: Payer: Self-pay | Admitting: Internal Medicine

## 2023-08-24 ENCOUNTER — Telehealth: Payer: Self-pay | Admitting: Internal Medicine

## 2023-08-24 NOTE — Telephone Encounter (Signed)
Patient requesting TOC to Dr.Hunter . States his wife is a current patient of his . Please advise

## 2023-08-24 NOTE — Telephone Encounter (Signed)
Per patient's wife's  AVS notes from her OV with desired provider, Dr. Durene Cal has approved of TOC request.

## 2023-08-28 NOTE — Telephone Encounter (Signed)
Ok with me

## 2023-09-03 NOTE — Telephone Encounter (Signed)
Spoke w/ pt and scheduled for next available in Feb

## 2023-09-20 ENCOUNTER — Other Ambulatory Visit: Payer: Self-pay | Admitting: Family

## 2023-09-27 ENCOUNTER — Other Ambulatory Visit: Payer: Self-pay | Admitting: Internal Medicine

## 2023-10-06 ENCOUNTER — Other Ambulatory Visit: Payer: Self-pay | Admitting: Internal Medicine

## 2023-10-29 ENCOUNTER — Encounter: Payer: Self-pay | Admitting: Internal Medicine

## 2023-10-29 NOTE — Telephone Encounter (Signed)
Last office visit: 07/25/2022  Attempted to reach pt to schedule an appointment as last office visit with provider was on 07/25/2022. Left a detail message that an appointment needs to be made and can send in limited supply. And call us back.

## 2023-10-30 MED ORDER — ATORVASTATIN CALCIUM 20 MG PO TABS: 20.0000 mg | ORAL_TABLET | Freq: Every day | ORAL | 1 refills | Status: DC

## 2023-10-30 NOTE — Telephone Encounter (Signed)
Ok to refill x 1 until he sees Dr Durene Cal  for transfer of  rx care

## 2023-11-14 ENCOUNTER — Encounter: Payer: Self-pay | Admitting: Family Medicine

## 2023-11-14 ENCOUNTER — Ambulatory Visit: Payer: Federal, State, Local not specified - PPO | Admitting: Family Medicine

## 2023-11-14 VITALS — BP 138/86 | HR 74 | Temp 98.6°F | Ht 67.75 in | Wt 226.4 lb

## 2023-11-14 DIAGNOSIS — F3342 Major depressive disorder, recurrent, in full remission: Secondary | ICD-10-CM | POA: Diagnosis not present

## 2023-11-14 DIAGNOSIS — R7309 Other abnormal glucose: Secondary | ICD-10-CM | POA: Diagnosis not present

## 2023-11-14 DIAGNOSIS — Z125 Encounter for screening for malignant neoplasm of prostate: Secondary | ICD-10-CM | POA: Diagnosis not present

## 2023-11-14 DIAGNOSIS — E538 Deficiency of other specified B group vitamins: Secondary | ICD-10-CM

## 2023-11-14 DIAGNOSIS — I10 Essential (primary) hypertension: Secondary | ICD-10-CM

## 2023-11-14 DIAGNOSIS — E785 Hyperlipidemia, unspecified: Secondary | ICD-10-CM | POA: Diagnosis not present

## 2023-11-14 DIAGNOSIS — K219 Gastro-esophageal reflux disease without esophagitis: Secondary | ICD-10-CM

## 2023-11-14 DIAGNOSIS — Z23 Encounter for immunization: Secondary | ICD-10-CM | POA: Diagnosis not present

## 2023-11-14 DIAGNOSIS — Z131 Encounter for screening for diabetes mellitus: Secondary | ICD-10-CM

## 2023-11-14 LAB — LIPID PANEL
Cholesterol: 157 mg/dL (ref 0–200)
HDL: 49.8 mg/dL (ref >39.00)
LDL Cholesterol: 87 mg/dL (ref 0–99)
NonHDL: 106.96
Total CHOL/HDL Ratio: 3
Triglycerides: 101 mg/dL (ref 0.0–149.0)
VLDL: 20.2 mg/dL (ref 0.0–40.0)

## 2023-11-14 LAB — COMPREHENSIVE METABOLIC PANEL
ALT: 28 U/L (ref 0–53)
AST: 19 U/L (ref 0–37)
Albumin: 4.3 g/dL (ref 3.5–5.2)
Alkaline Phosphatase: 58 U/L (ref 39–117)
BUN: 20 mg/dL (ref 6–23)
CO2: 27 meq/L (ref 19–32)
Calcium: 8.9 mg/dL (ref 8.4–10.5)
Chloride: 103 meq/L (ref 96–112)
Creatinine, Ser: 0.76 mg/dL (ref 0.40–1.50)
GFR: 91.15 mL/min (ref >60.00)
Glucose, Bld: 112 mg/dL — ABNORMAL HIGH (ref 70–99)
Potassium: 3.5 meq/L (ref 3.5–5.1)
Sodium: 140 meq/L (ref 135–145)
Total Bilirubin: 0.6 mg/dL (ref 0.2–1.2)
Total Protein: 7 g/dL (ref 6.0–8.3)

## 2023-11-14 LAB — CBC WITH DIFFERENTIAL/PLATELET
Basophils Absolute: 0.1 10*3/uL (ref 0.0–0.1)
Basophils Relative: 1 % (ref 0.0–3.0)
Eosinophils Absolute: 0.2 10*3/uL (ref 0.0–0.7)
Eosinophils Relative: 3.5 % (ref 0.0–5.0)
HCT: 42.9 % (ref 39.0–52.0)
Hemoglobin: 14.6 g/dL (ref 13.0–17.0)
Lymphocytes Relative: 28.3 % (ref 12.0–46.0)
Lymphs Abs: 1.7 10*3/uL (ref 0.7–4.0)
MCHC: 34.1 g/dL (ref 30.0–36.0)
MCV: 92.3 fL (ref 78.0–100.0)
Monocytes Absolute: 0.5 10*3/uL (ref 0.1–1.0)
Monocytes Relative: 8 % (ref 3.0–12.0)
Neutro Abs: 3.5 10*3/uL (ref 1.4–7.7)
Neutrophils Relative %: 59.2 % (ref 43.0–77.0)
Platelets: 248 10*3/uL (ref 150.0–400.0)
RBC: 4.65 Mil/uL (ref 4.22–5.81)
RDW: 13.3 % (ref 11.5–15.5)
WBC: 5.9 10*3/uL (ref 4.0–10.5)

## 2023-11-14 LAB — PSA: PSA: 3.59 ng/mL (ref 0.10–4.00)

## 2023-11-14 LAB — VITAMIN B12: Vitamin B-12: 372 pg/mL (ref 211–911)

## 2023-11-14 LAB — HEMOGLOBIN A1C: Hgb A1c MFr Bld: 5.7 % (ref 4.6–6.5)

## 2023-11-14 NOTE — Patient Instructions (Addendum)
Health Maintenance Due  Topic Date Due   DTaP/Tdap/Td (2 - Tdap) 10/02/2016   Zoster Vaccines- Shingrix (2 of 2) 06/08/2021  Both today  Please stop by lab before you go If you have mychart- we will send your results within 3 business days of [place] receiving them.  If you do not have mychart- we will call you about results within 5 business days of [place] receiving them.  *please also note that you will see labs on mychart as soon as they post. I will later go in and write notes on them- will say "notes from Dr. Durene Cal"   Recommend limiting alcohol to 2 a day- if that is not possible- could consider AA for support- just worry about your liver in long run  Recommend sleep study/pulmonary referral- you wanted to think on this  Recommended follow up: Return in about 6 months (around 05/13/2024) for physical or sooner if needed.Schedule b4 you leave.

## 2023-11-14 NOTE — Progress Notes (Signed)
Phone: [phone]   Subjective:  Patient presents today to establish care with me as their new primary care provider. Patient was formerly a patient of Dr. Fabian Sharp.  Chief Complaint  Patient presents with   Establish Care    Pt here to establish care, pt does not have any concerns - pt fasting today, Due for 2nd Shingles and Tdap. Pt due for foot and eye exam.   Anxiety   Anemia   Depression   Gastroesophageal Reflux   Hypertension   See problem oriented charting  The following were reviewed and entered/updated in epic: Past Medical History:  Diagnosis Date   Anxiety    Cancer (HCC)    skin cancer   DEPRESSION 12/02/2008   GERD 12/02/2008    no endo   Heart murmur    born with but grew out   Horseshoe kidney    simple cyst on ct  [place] 2010   HYPERGLYCEMIA, MILD 01/19/2010   HYPERTENSION 12/02/2008   Hypertension    IRON DEFICIENCY 07/18/2010   doantes blood   Muscle fasciculation 11/14/2010   Muscle twitch 11/14/2010   Left rm  Poss overuse  R/o metabolic     OBESITY 08/09/2010   SNORING 07/18/2010   Patient Active Problem List   Diagnosis Date Noted   Alcohol use 01/31/2011    Priority: High   Essential hypertension 09/27/2014    Priority: Medium    Dyslipidemia 08/11/2012    Priority: Medium    BPH with obstruction/lower urinary tract symptoms 08/05/2012    Priority: Medium    Horseshoe kidney     Priority: Medium    HYPERGLYCEMIA, MILD 01/19/2010    Priority: Medium    Depression, major, recurrent, in complete remission (HCC) 12/02/2008    Priority: Medium    GERD 12/02/2008    Priority: Medium    Hypokalemia 01/31/2013    Priority: Low   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    Priority: Low   OBESITY 08/09/2010    Priority: Low   IRON DEFICIENCY 07/18/2010    Priority: Low   SNORING 07/18/2010    Priority: Low   Past Surgical History:  Procedure Laterality Date   COLONOSCOPY     KNEE ARTHROSCOPY WITH LATERAL MENISECTOMY Right 04/22/2015    Procedure: KNEE ARTHROSCOPY WITH LATERAL MENISECTOMY;  Surgeon: Mckinley Jewel, MD;  Location: [facility];  Service: Orthopedics;  Laterality: Right;   KNEE ARTHROSCOPY WITH MEDIAL MENISECTOMY Right 04/22/2015   Procedure: RIGHT KNEE ARTHROSCOPY WITH  CHONDROPLASTY MEDIAL MENISECTOMY;  Surgeon: Mckinley Jewel, MD;  Location: [facility];  Service: Orthopedics;  Laterality: Right;   KNEE CARTILAGE SURGERY  2009   Left Knee    POLYPECTOMY     SHOULDER SURGERY  1994   Left   TONSILLECTOMY  1963    Family History  Problem Relation Age of Onset   Stroke Mother        and multiple TIAs   Depression Mother    Dementia Mother        died at 46   Hypertension Father    Heart disease Father        74   Alcohol abuse Father        heavy drinking runs in family on dad's side   Colon polyps Father    Cirrhosis Father    Sleep apnea Sister        obesity   Colon cancer Neg Hx    Esophageal cancer Neg  Hx    Rectal cancer Neg Hx    Stomach cancer Neg Hx     Medications- reviewed and updated Current Outpatient Medications  Medication Sig Dispense Refill   amLODipine (NORVASC) 10 MG tablet TAKE 1 TABLET BY MOUTH EVERY DAY 90 tablet 3   atorvastatin (LIPITOR) 20 MG tablet Take 1 tablet (20 mg total) by mouth daily. 30 tablet 1   carvedilol (COREG) 25 MG tablet Take 0.5 tablets (12.5 mg total) by mouth 2 (two) times daily with a meal. (Patient taking differently: Take 25 mg by mouth daily.) 90 tablet 0   escitalopram (LEXAPRO) 20 MG tablet TAKE 1 TABLET BY MOUTH EVERY DAY 90 tablet 1   KLOR-CON M20 20 MEQ tablet TAKE 1 TABLET BY MOUTH EVERY DAY 90 tablet 2   Multiple Vitamin (MULTIVITAMIN) capsule Take 1 capsule by mouth daily.     tamsulosin (FLOMAX) 0.4 MG CAPS capsule Take 1 capsule (0.4 mg total) by mouth daily. Take 1 capsule daily ( Please contact Urology for further refills if still managing) 30 capsule 0   valsartan-hydrochlorothiazide (DIOVAN-HCT) 320-25  MG tablet TAKE 1 TABLET BY MOUTH EVERY DAY 90 tablet 2   No current facility-administered medications for this visit.    Allergies-reviewed and updated Allergies  Allergen Reactions   Codeine Other (See Comments)    REACTION: confusion   Wellbutrin [Bupropion] Anxiety    Social History   Social History Narrative   Married 32 years in 2025- 1 step daughter   Originally from [place] 1993  To [place]   Used to have dogs and cats      Works for [employer]. 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       Donates blood      Hobbies: enjoys travel      Objective  Objective:  BP 138/86   Pulse 74   Temp 98.6 F (37 C)   Ht 5' 7.75" (1.721 m)   Wt 226 lb 6.4 oz (102.7 kg)   SpO2 97%   BMI 34.68 kg/m  Gen: NAD, resting comfortably HEENT: Mucous membranes are moist. Oropharynx normal Neck: no thyromegaly CV: RRR no murmurs rubs or gallops Lungs: CTAB no crackles, wheeze, rhonchi Abdomen: soft/nontender/nondistended/normal bowel sounds. No rebound or guarding.  Ext: no edema Skin: warm, dry Neuro: grossly normal, moves all extremities, PERRLA    Assessment and Plan:    #mass of right thigh- follow up [facility]- MRI yearly  #hypertension S: medication: amlodipine 10 mg, carvedilol 25 mg- is to take half twice a day (difficult at times), valsartan hydrochlorothiazide 320-25 mg - needs potassium daily- reports history of low on meds Home readings #s:  signed for him to get home cuff A/P: high acceptable- continue to monitor and try to reduce alcohol intake  #hyperlipidemia S: Medication: atorvastatin 20 mg  Lab Results  Component Value Date   CHOL 139 07/25/2022   HDL 45.00 07/25/2022   LDLCALC 75 07/25/2022   LDLDIRECT 75.0 03/11/2018   TRIG 91.0 07/25/2022   CHOLHDL 3 07/25/2022   A/P: close to ideal goal last year- update lipid panel  # Depression/anxiety S: Medication: Lexapro 20 mg- on for many  years A/P: full remission- continue current medications   # GERD S:Medication:  Nexium OTC (available over the counter without a prescription) from [employer] A/P: reasonable control- continue current medications - check B12 with relative deficiency in the past and alcohol intake   # Hyperglycemia/insulin  resistance/prediabetes- a1c as high as 5.7 S:  Medication: none Lab Results  Component Value Date   HGBA1C 5.7 07/25/2022   HGBA1C 5.7 04/13/2021   HGBA1C 5.6 02/20/2020   A/P: hopefully stable- update a1c today. Continue without meds for now   #BPH- tamsulosin helpful for urinary symptoms- sees [facility]  - Sees Dr. Perley Jain- he wants to continue PSA checks  #Alcohol use disorder- 3-4 beer a day- recommend max 2 a day but ideally cessation   #Snoring- have recommended obstructive sleep apnea testing- wants to hold off for now    #Health maintenance- tetanus and shingles shot today. Holding off on COVID. Already had flu shot.   Recommended follow up: Return in about 6 months (around 05/13/2024) for physical or sooner if needed.Schedule b4 you leave.  Lab/Order associations:   ICD-10-CM   1. Depression, major, recurrent, in complete remission (HCC)  F33.42     2. Gastroesophageal reflux disease, unspecified whether esophagitis present  K21.9     3. Essential hypertension  I10 Comprehensive metabolic panel    CBC with Differential/Platelet    Lipid panel    4. HYPERGLYCEMIA, MILD  R73.09 Hemoglobin A1c    5. Dyslipidemia  E78.5 Comprehensive metabolic panel    CBC with Differential/Platelet    Lipid panel    6. B12 deficiency  E53.8 Vitamin B12    7. Screening for diabetes mellitus  Z13.1 Hemoglobin A1c    8. Screening for prostate cancer  Z12.5 PSA    9. Need for shingles vaccine  Z23 Zoster Recombinant (Shingrix )    10. Need for Tdap vaccination  Z23 Tdap vaccine greater than or equal to 7yo IM      No orders of the defined types were placed in this  encounter.    Return precautions advised.  Tana Conch, MD

## 2023-11-15 ENCOUNTER — Encounter: Payer: Self-pay | Admitting: Family Medicine

## 2023-11-19 ENCOUNTER — Other Ambulatory Visit: Payer: Self-pay

## 2023-11-19 DIAGNOSIS — R972 Elevated prostate specific antigen [PSA]: Secondary | ICD-10-CM

## 2023-11-22 ENCOUNTER — Other Ambulatory Visit: Payer: Self-pay | Admitting: Internal Medicine

## 2024-01-01 ENCOUNTER — Encounter: Payer: Self-pay | Admitting: Family Medicine

## 2024-01-01 ENCOUNTER — Ambulatory Visit: Admitting: Family Medicine

## 2024-01-01 VITALS — BP 142/88 | HR 72 | Temp 98.0°F | Ht 67.5 in | Wt 225.8 lb

## 2024-01-01 DIAGNOSIS — R21 Rash and other nonspecific skin eruption: Secondary | ICD-10-CM

## 2024-01-01 DIAGNOSIS — I10 Essential (primary) hypertension: Secondary | ICD-10-CM

## 2024-01-01 MED ORDER — TRIAMCINOLONE ACETONIDE 0.1 % EX CREA: 1.0000 | TOPICAL_CREAM | Freq: Two times a day (BID) | CUTANEOUS | 0 refills | Status: DC

## 2024-01-01 NOTE — Patient Instructions (Addendum)
 Rash on right arm appears to be irritant from the formica tabletop that scraped him- does not appear infected (not tender and only mildly warm and red and itches some pointing more toward irritant). We will trial triamcinolone steroid cream- basically to treat resulting inflammation from likely allergic reaction. If the are gets more red, more tender or he gets fever he will contact me immediately- would consider antibiotic(s) in that case   blood pressure slightly high today but has not had medications yet- asked him to update me with home readings sometime in the next week after medicine in system for at least 2 hours- continue current medications for now   Recommended follow up: Return for next already scheduled visit or sooner if needed.

## 2024-01-01 NOTE — Progress Notes (Signed)
 Phone [phone] In person visit   Subjective:   Jaime Orr is a 73 y.o. year old very pleasant male patient who presents for/with See problem oriented charting Chief Complaint  Patient presents with   Urticaria    Pt c/o hives on right arm since he gashed his arm open 2 weeks ago and thinks it may be from   Past Medical History-  Patient Active Problem List   Diagnosis Date Noted   Alcohol use 01/31/2011    Priority: High   Essential hypertension 09/27/2014    Priority: Medium    Dyslipidemia 08/11/2012    Priority: Medium    BPH with obstruction/lower urinary tract symptoms 08/05/2012    Priority: Medium    Horseshoe kidney     Priority: Medium    HYPERGLYCEMIA, MILD 01/19/2010    Priority: Medium    Depression, major, recurrent, in complete remission (HCC) 12/02/2008    Priority: Medium    GERD 12/02/2008    Priority: Medium    Hypokalemia 01/31/2013    Priority: Low   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    Priority: Low   OBESITY 08/09/2010    Priority: Low   IRON DEFICIENCY 07/18/2010    Priority: Low   SNORING 07/18/2010    Priority: Low    Medications- reviewed and updated Current Outpatient Medications  Medication Sig Dispense Refill   amLODipine (NORVASC) 10 MG tablet TAKE 1 TABLET BY MOUTH EVERY DAY 90 tablet 3   atorvastatin (LIPITOR) 20 MG tablet TAKE 1 TABLET BY MOUTH EVERY DAY 90 tablet 1   carvedilol (COREG) 25 MG tablet Take 0.5 tablets (12.5 mg total) by mouth 2 (two) times daily with a meal. (Patient taking differently: Take 25 mg by mouth daily.) 90 tablet 0   escitalopram (LEXAPRO) 20 MG tablet TAKE 1 TABLET BY MOUTH EVERY DAY 90 tablet 1   KLOR-CON M20 20 MEQ tablet TAKE 1 TABLET BY MOUTH EVERY DAY 90 tablet 2   Multiple Vitamin (MULTIVITAMIN) capsule Take 1 capsule by mouth daily.     tamsulosin (FLOMAX) 0.4 MG CAPS capsule Take 1 capsule (0.4 mg total) by mouth daily. Take 1 capsule daily ( Please contact Urology for further  refills if still managing) 30 capsule 0   triamcinolone cream (KENALOG) 0.1 % Apply 1 Application topically 2 (two) times daily. For 7-10 days maximum 160 g 0   valsartan-hydrochlorothiazide (DIOVAN-HCT) 320-25 MG tablet TAKE 1 TABLET BY MOUTH EVERY DAY 90 tablet 2   No current facility-administered medications for this visit.     Objective:  BP (!) 142/88 Comment: has not had his medicine yet today  Pulse 72   Temp 98 F (36.7 C)   Ht 5' 7.5" (1.715 m)   Wt 225 lb 12.8 oz (102.4 kg)   SpO2 97%   BMI 34.84 kg/m  Gen: NAD, resting comfortably CV: RRR no murmurs rubs or gallops Lungs: CTAB no crackles, wheeze, rhonchi Ext: left forearm normal, right forearm with a 1 x 1 cm scab centrally surrounded by erythema light with slight increased warmth and without tenderness    Assessment and Plan    # Rash S:laceration on right arm about 2 weeks ago- reaching near formica tabletop and took several layers of skin trying to squeeze his arm through. He has developed hives on this arm since that time  -aquaphor helps some  ROS-not ill appearing, no fever/chills. No new medications. Not immunocompromised. No mucus membrane involvement.  A/P: Rash on right arm  appears to be irritant from the formica tabletop that scraped him- does not appear infected (not tender and only mildly warm and red and itches some pointing more toward irritant). We will trial triamcinolone steroid cream- basically to treat resulting inflammation from likely allergic reaction. If the are gets more red, more tender or he gets fever he will contact me immediately- would consider antibiotic(s) in that case   #hypertension S: medication: amlodipine 10 mg first food of morning, carvedilol 25 mg- is to take half twice a day (difficult at times), valsartan hydrochlorothiazide 320-25 mg BP Readings from Last 3 Encounters:  01/01/24 (!) 142/88  11/14/23 138/86  07/25/22 (!) 152/84  A/P: blood pressure slightly high today but  has not had medications yet- asked him to update me with home readings sometime in the next week after medicine in system for at least 2 hours- continue current medications for now (and get dose in as soon as he can)  Recommended follow up: Return for next already scheduled visit or sooner if needed. Future Appointments  Date Time Provider Department Center  05/19/2024 10:00 AM Shelva Majestic, MD [facility]    Lab/Order associations:   ICD-10-CM   1. Rash  R21     2. Essential hypertension  I10       Meds ordered this encounter  Medications   triamcinolone cream (KENALOG) 0.1 %    Sig: Apply 1 Application topically 2 (two) times daily. For 7-10 days maximum    Dispense:  160 g    Refill:  0    Return precautions advised.  Tana Conch, MD

## 2024-01-31 ENCOUNTER — Other Ambulatory Visit: Payer: Self-pay | Admitting: Internal Medicine

## 2024-01-31 ENCOUNTER — Other Ambulatory Visit: Payer: Self-pay | Admitting: Family

## 2024-02-05 ENCOUNTER — Other Ambulatory Visit: Payer: Self-pay

## 2024-02-05 MED ORDER — VALSARTAN-HYDROCHLOROTHIAZIDE 320-25 MG PO TABS: 1.0000 | ORAL_TABLET | Freq: Every day | ORAL | 2 refills | Status: DC

## 2024-03-26 ENCOUNTER — Other Ambulatory Visit: Payer: Self-pay | Admitting: Family

## 2024-04-15 ENCOUNTER — Encounter: Payer: Self-pay | Admitting: Family Medicine

## 2024-04-16 ENCOUNTER — Ambulatory Visit: Admitting: Family

## 2024-05-07 ENCOUNTER — Other Ambulatory Visit: Payer: Self-pay | Admitting: Internal Medicine

## 2024-05-07 ENCOUNTER — Other Ambulatory Visit: Payer: Self-pay | Admitting: Family

## 2024-05-09 ENCOUNTER — Other Ambulatory Visit: Payer: Self-pay

## 2024-05-09 ENCOUNTER — Encounter: Payer: Self-pay | Admitting: Family Medicine

## 2024-05-09 MED ORDER — ATORVASTATIN CALCIUM 20 MG PO TABS: 20.0000 mg | ORAL_TABLET | Freq: Every day | ORAL | 1 refills | Status: DC

## 2024-05-09 MED ORDER — ESCITALOPRAM OXALATE 20 MG PO TABS: 20.0000 mg | ORAL_TABLET | Freq: Every day | ORAL | 1 refills | Status: DC

## 2024-05-19 ENCOUNTER — Ambulatory Visit: Payer: Self-pay | Admitting: Family Medicine

## 2024-05-19 ENCOUNTER — Encounter: Payer: Self-pay | Admitting: Family Medicine

## 2024-05-19 ENCOUNTER — Ambulatory Visit (INDEPENDENT_AMBULATORY_CARE_PROVIDER_SITE_OTHER): Payer: Federal, State, Local not specified - PPO | Admitting: Family Medicine

## 2024-05-19 VITALS — BP 146/80 | HR 68 | Temp 98.0°F | Ht 67.5 in | Wt 230.4 lb

## 2024-05-19 DIAGNOSIS — Z0000 Encounter for general adult medical examination without abnormal findings: Secondary | ICD-10-CM | POA: Diagnosis not present

## 2024-05-19 DIAGNOSIS — I10 Essential (primary) hypertension: Secondary | ICD-10-CM

## 2024-05-19 DIAGNOSIS — E669 Obesity, unspecified: Secondary | ICD-10-CM | POA: Diagnosis not present

## 2024-05-19 DIAGNOSIS — Z125 Encounter for screening for malignant neoplasm of prostate: Secondary | ICD-10-CM

## 2024-05-19 DIAGNOSIS — Z6835 Body mass index (BMI) 35.0-35.9, adult: Secondary | ICD-10-CM

## 2024-05-19 DIAGNOSIS — Z131 Encounter for screening for diabetes mellitus: Secondary | ICD-10-CM

## 2024-05-19 DIAGNOSIS — E785 Hyperlipidemia, unspecified: Secondary | ICD-10-CM

## 2024-05-19 LAB — CBC WITH DIFFERENTIAL/PLATELET
Basophils Absolute: 0.1 K/uL (ref 0.0–0.1)
Basophils Relative: 1.1 % (ref 0.0–3.0)
Eosinophils Absolute: 0.3 K/uL (ref 0.0–0.7)
Eosinophils Relative: 5.5 % — ABNORMAL HIGH (ref 0.0–5.0)
HCT: 41.3 % (ref 39.0–52.0)
Hemoglobin: 13.9 g/dL (ref 13.0–17.0)
Lymphocytes Relative: 35.1 % (ref 12.0–46.0)
Lymphs Abs: 1.7 K/uL (ref 0.7–4.0)
MCHC: 33.5 g/dL (ref 30.0–36.0)
MCV: 90.4 fl (ref 78.0–100.0)
Monocytes Absolute: 0.4 K/uL (ref 0.1–1.0)
Monocytes Relative: 8 % (ref 3.0–12.0)
Neutro Abs: 2.4 K/uL (ref 1.4–7.7)
Neutrophils Relative %: 50.3 % (ref 43.0–77.0)
Platelets: 251 K/uL (ref 150.0–400.0)
RBC: 4.57 Mil/uL (ref 4.22–5.81)
RDW: 13.9 % (ref 11.5–15.5)
WBC: 4.9 K/uL (ref 4.0–10.5)

## 2024-05-19 LAB — COMPREHENSIVE METABOLIC PANEL WITH GFR
ALT: 28 U/L (ref 0–53)
AST: 21 U/L (ref 0–37)
Albumin: 4.1 g/dL (ref 3.5–5.2)
Alkaline Phosphatase: 48 U/L (ref 39–117)
BUN: 23 mg/dL (ref 6–23)
CO2: 28 meq/L (ref 19–32)
Calcium: 8.7 mg/dL (ref 8.4–10.5)
Chloride: 102 meq/L (ref 96–112)
Creatinine, Ser: 0.83 mg/dL (ref 0.40–1.50)
GFR: 88.43 mL/min (ref >60.00)
Glucose, Bld: 103 mg/dL — ABNORMAL HIGH (ref 70–99)
Potassium: 3.7 meq/L (ref 3.5–5.1)
Sodium: 139 meq/L (ref 135–145)
Total Bilirubin: 0.4 mg/dL (ref 0.2–1.2)
Total Protein: 6.4 g/dL (ref 6.0–8.3)

## 2024-05-19 LAB — LIPID PANEL
Cholesterol: 158 mg/dL (ref 0–200)
HDL: 42.8 mg/dL (ref >39.00)
LDL Cholesterol: 78 mg/dL (ref 0–99)
NonHDL: 114.9
Total CHOL/HDL Ratio: 4
Triglycerides: 184 mg/dL — ABNORMAL HIGH (ref 0.0–149.0)
VLDL: 36.8 mg/dL (ref 0.0–40.0)

## 2024-05-19 LAB — HEMOGLOBIN A1C: Hgb A1c MFr Bld: 5.9 % (ref 4.6–6.5)

## 2024-05-19 LAB — PSA: PSA: 2.89 ng/mL (ref 0.10–4.00)

## 2024-05-19 NOTE — Patient Instructions (Addendum)
 I love your goal for liver health and cancer risk reduction and weight management of reducing alcohol to just 2 a day  blood pressure high again today- was slightly high last visit as well- he wants to do some home monitoring and update me in 2 weeks- if no better we may need to make adjustments. Also plans to reduce alcohol to 2 a day which may help -also feels can reduce salt intake -check blood pressure daily and send me full 2 week log  Please stop by lab before you go If you have mychart- we will send your results within 3 business days of us  receiving them.  If you do not have mychart- we will call you about results within 5 business days of us  receiving them.  *please also note that you will see labs on mychart as soon as they post. I will later go in and write notes on them- will say notes from Dr. Katrinka   Recommended follow up: Return in about 6 months (around 11/19/2024) for followup or sooner if needed.Schedule b4 you leave.

## 2024-05-19 NOTE — Progress Notes (Signed)
 Phone: [phone]   Subjective:  Patient presents today for their annual physical. Chief complaint-noted.   See problem oriented charting- ROS- full  review of systems was completed and negative  Per full ROS sheet completed by patient except for topics noted under acute/chronic concerns  The following were reviewed and entered/updated in epic: Past Medical History:  Diagnosis Date   Anxiety    Cancer (HCC)    skin cancer   DEPRESSION 12/02/2008   GERD 12/02/2008    no endo   Heart murmur    born with but grew out   Horseshoe kidney    simple cyst on ct  US  2010   HYPERGLYCEMIA, MILD 01/19/2010   HYPERTENSION 12/02/2008   Hypertension    IRON DEFICIENCY 07/18/2010   doantes blood   Muscle fasciculation 11/14/2010   Muscle twitch 11/14/2010   Left rm  Poss overuse  R/o metabolic     OBESITY 08/09/2010   SNORING 07/18/2010   Patient Active Problem List   Diagnosis Date Noted   Alcohol use 01/31/2011    Priority: High   Essential hypertension 09/27/2014    Priority: Medium    Dyslipidemia 08/11/2012    Priority: Medium    BPH with obstruction/lower urinary tract symptoms 08/05/2012    Priority: Medium    Horseshoe kidney     Priority: Medium    HYPERGLYCEMIA, MILD 01/19/2010    Priority: Medium    Depression, major, recurrent, in complete remission (HCC) 12/02/2008    Priority: Medium    GERD 12/02/2008    Priority: Medium    Hypokalemia 01/31/2013    Priority: Low   Low serum testosterone  level 02/09/2012    Priority: Low   OBESITY 08/09/2010    Priority: Low   IRON DEFICIENCY 07/18/2010    Priority: Low   SNORING 07/18/2010    Priority: Low   Obesity, morbid (HCC) 05/19/2024   Past Surgical History:  Procedure Laterality Date   COLONOSCOPY     KNEE ARTHROSCOPY WITH LATERAL MENISECTOMY Right 04/22/2015   Procedure: KNEE ARTHROSCOPY WITH LATERAL MENISECTOMY;  Surgeon: Toribio Chancy, MD;  Location: [facility];  Service: Orthopedics;   Laterality: Right;   KNEE ARTHROSCOPY WITH MEDIAL MENISECTOMY Right 04/22/2015   Procedure: RIGHT KNEE ARTHROSCOPY WITH  CHONDROPLASTY MEDIAL MENISECTOMY;  Surgeon: Toribio Chancy, MD;  Location: [facility];  Service: Orthopedics;  Laterality: Right;   KNEE CARTILAGE SURGERY  2009   Left Knee    POLYPECTOMY     SHOULDER SURGERY  1994   Left   TONSILLECTOMY  1963    Family History  Problem Relation Age of Onset   Stroke Mother        and multiple TIAs   Depression Mother    Dementia Mother        died at 34   Hypertension Father    Heart disease Father        29   Alcohol abuse Father        heavy drinking runs in family on dad's side   Colon polyps Father    Cirrhosis Father    Sleep apnea Sister        obesity   Colon cancer Neg Hx    Esophageal cancer Neg Hx    Rectal cancer Neg Hx    Stomach cancer Neg Hx     Medications- reviewed and updated Current Outpatient Medications  Medication Sig Dispense Refill   amLODipine  (NORVASC ) 10 MG tablet TAKE 1  TABLET BY MOUTH EVERY DAY 90 tablet 3   atorvastatin  (LIPITOR) 20 MG tablet Take 1 tablet (20 mg total) by mouth daily. 90 tablet 1   carvedilol  (COREG ) 25 MG tablet TAKE 0.5 TABLETS (12.5 MG TOTAL) BY MOUTH 2 (TWO) TIMES DAILY WITH A MEAL. 90 tablet 1   escitalopram  (LEXAPRO ) 20 MG tablet Take 1 tablet (20 mg total) by mouth daily. 90 tablet 1   potassium chloride  SA (KLOR-CON  M) 20 MEQ tablet TAKE 1 TABLET BY MOUTH EVERY DAY 90 tablet 2   tamsulosin  (FLOMAX ) 0.4 MG CAPS capsule Take 1 capsule (0.4 mg total) by mouth daily. Take 1 capsule daily ( Please contact Urology for further refills if still managing) 30 capsule 0   valsartan -hydrochlorothiazide  (DIOVAN -HCT) 320-25 MG tablet Take 1 tablet by mouth daily. 90 tablet 2   No current facility-administered medications for this visit.    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       Donates blood      Hobbies: enjoys travel      Objective  Objective:  BP (!) 146/80   Pulse 68   Temp 98 F (36.7 C) (Temporal)   Ht 5' 7.5 (1.715 m)   Wt 230 lb 6.4 oz (104.5 kg)   SpO2 94%   BMI 35.55 kg/m  Gen: NAD, resting comfortably HEENT: Mucous membranes are moist. Oropharynx normal Neck: no thyromegaly CV: RRR no murmurs rubs or gallops Lungs: CTAB no crackles, wheeze, rhonchi Abdomen: soft/nontender/nondistended/normal bowel sounds. No rebound or guarding.  Ext: trace to 1+  edema- advised compression  Skin: warm, dry Neuro: grossly normal, moves all extremities, PERRLA   Assessment and Plan  73 y.o. male presenting for annual physical.  Health Maintenance counseling: 1. Anticipatory guidance: Patient counseled regarding regular dental exams -q6 months, eye exams - yearly,  avoiding smoking and second hand smoke , limiting alcohol to 2 beverages per day - 3-4 per day encouraged reduction- has been encouraged by prior doctor- has been able to quit for month in past- encouraged to reduce to 2 a day, no illicit drugs.   2. Risk factor reduction:  Advised patient of need for regular exercise and diet rich and fruits and vegetables to reduce risk of heart attack and stroke.  Exercise- still very active with work- just quit [employer]- was not going consistently and he was mainly just walking. Goal 150 minutes a week outside of work  Diet/weight management-up 5 lbs from last visit.  Morbid obesity noted with BMI over 35 and hypertension and hyperlipidemia . Plans to reduce alcohol. Not doing any restrictions but considering- feels could reduce portoins Wt  Readings from Last 3 Encounters:  05/19/24 230 lb 6.4 oz (104.5 kg)  01/01/24 225 lb 12.8 oz (102.4 kg)  11/14/23 226 lb 6.4 oz (102.7 kg)  3. Immunizations/screenings/ancillary studies-recommended fall flu shot.  Discussed COVID shot as well-opts out for now   Immunization History  Administered Date(s) Administered   Fluad Quad(high Dose 65+) 06/26/2022   Influenza Split 08/14/2011, 08/05/2012   Influenza Whole  07/18/2010   Influenza, High Dose Seasonal PF 05/20/2023   Influenza,inj,Quad PF,6+ Mos 06/21/2015, 06/16/2016, 11/12/2017   Influenza-Unspecified 10/24/2018   MMR 10/24/2018   PFIZER(Purple Top)SARS-COV-2 Vaccination 10/09/2019, 10/27/2019, 07/21/2020   PNEUMOCOCCAL CONJUGATE-20 04/13/2021   Td 10/02/2006   Tdap 11/14/2023   Varicella 10/24/2018   Zoster Recombinant(Shingrix ) 04/13/2021, 11/14/2023  4. Prostate cancer screening- see discussion below Lab Results  Component Value Date   PSA 3.59 11/14/2023   PSA 1.85 07/25/2022   PSA 2.45 04/13/2021   5. Colon cancer screening - 05/17/2016 with 10-year repeat planned 6. Skin cancer screening- history of skin cancer- sees dermatology at [facility]. advised regular sunscreen use. Denies worrisome, changing, or new skin lesions.  7. Smoking associated screening (lung cancer screening, AAA screen 65-75, UA)- former smoker- quit 2015. Social smoker for 20 years. About 2 packs a week. No aneurysm in 2013 so hold off on repeat 8. STD screening - only active with wife  Status of chronic or acute concerns   #mass of right thigh- [facility]- MRI yearly-most recently 09/05/2023  #elevated PSA on first visit- [facility] but levels went back down to 1.81 after spiking to 3.59 with us - prior 2.17   #hypertension S: medication: amlodipine  10 mg, carvedilol  25 mg- is to take half twice a day (difficult at times), valsartan  hydrochlorothiazide  320-25 mg - needs potassium daily- reports history of low on  meds Home readings #s: no recent checks but has machine BP Readings from Last 3 Encounters:  05/19/24 (!) 146/80  01/01/24 (!) 142/88  11/14/23 138/86  A/P: blood pressure high again today- was slightly high last visit as well- he wants to do some home monitoring and update me in 2 weeks- if no better we may need to make adjustments. Also plans to reduce alcohol to 2 a day which may help  -also feels can reduce salt intake  #hyperlipidemia S: Medication: atorvastatin  20 mg daily  -reports in study for lipoprotein a  previously but didn't qualify as didn't have lipoprotein (a)  Lab Results  Component Value Date   CHOL 157 11/14/2023   HDL 49.80 11/14/2023   LDLCALC 87 11/14/2023   LDLDIRECT 75.0 03/11/2018   TRIG 101.0 11/14/2023   CHOLHDL 3 11/14/2023     A/P: hopefully stable- update lipid panel today. Continue current meds for now. More ideal would be LDL under 70 work on lifestyle over medication change  # Depression/anxiety S: Medication: Lexapro  20 mg- on for many years    05/19/2024    9:50 AM 11/14/2023   10:02 AM 11/14/2023   10:01 AM  Depression screen PHQ 2/9  Decreased Interest 0 0 0  Down, Depressed, Hopeless 0 0 0  PHQ - 2 Score 0 0 0  Altered sleeping 0 0   Tired, decreased energy 1 0   Change in appetite 0 0   Feeling bad or failure about yourself  0 0   Trouble concentrating 0 0   Moving slowly or fidgety/restless 0 0   Suicidal thoughts 0 0   PHQ-9 Score 1 0   Difficult doing work/chores Not difficult at all Not difficult at all   A/P: full remission- continue current medications   # GERD S:Medication:  Nexium  OTC (available over the counter without a prescription) from [facility] A/P: reasonable control- continue current medications    # Hyperglycemia/insulin resistance/prediabetes- a1c as high as 5.7 S:  Medication: none Lab Results  Component Value Date   HGBA1C 5.7 11/14/2023  HGBA1C 5.7 07/25/2022   HGBA1C 5.7 04/13/2021   A/P: hopefully  stable- update a1c today. Continue without meds for now   #BPH- tamsulosin  helpful for urinary symptoms- sees [facility] - finds helpful  #Alcohol use disorder- 3-4 beer a day- see above but plans to reduce to 2 a day   #Snoring- have recommended obstructive sleep apnea testing in past but reports better and  wants to hold off for now  on obstructive sleep apnea testing   Recommended follow up: Return in about 6 months (around 11/19/2024) for followup or sooner if needed.Schedule b4 you leave.  Lab/Order associations: fasting   ICD-10-CM   1. Preventative health care  Z00.00     2. Screening for prostate cancer  Z12.5 PSA    3. Screening for diabetes mellitus  Z13.1 Hemoglobin A1c    4. Obesity (BMI 30-39.9)  E66.9 Hemoglobin A1c    5. Dyslipidemia  E78.5 Comprehensive metabolic panel with GFR    CBC with Differential/Platelet    Lipid panel    6. Essential hypertension  I10 Comprehensive metabolic panel with GFR    CBC with Differential/Platelet    Urinalysis, Routine w reflex microscopic    7. Obesity, morbid (HCC) Chronic E66.01     8. Severe obesity (BMI 35.0-35.9 with comorbidity) (HCC)  E66.01    Z68.35       No orders of the defined types were placed in this encounter.   Return precautions advised.  Garnette Lukes, MD

## 2024-06-13 ENCOUNTER — Encounter: Payer: Self-pay | Admitting: Family Medicine

## 2024-07-18 ENCOUNTER — Other Ambulatory Visit (HOSPITAL_COMMUNITY): Payer: Self-pay

## 2024-07-18 ENCOUNTER — Encounter: Payer: Self-pay | Admitting: Family Medicine

## 2024-07-18 ENCOUNTER — Ambulatory Visit: Admitting: Family Medicine

## 2024-07-18 ENCOUNTER — Telehealth: Payer: Self-pay

## 2024-07-18 ENCOUNTER — Other Ambulatory Visit: Payer: Self-pay | Admitting: Family Medicine

## 2024-07-18 VITALS — BP 132/78 | HR 67 | Temp 98.2°F | Ht 67.5 in | Wt 220.8 lb

## 2024-07-18 DIAGNOSIS — Z6834 Body mass index (BMI) 34.0-34.9, adult: Secondary | ICD-10-CM

## 2024-07-18 DIAGNOSIS — E66811 Obesity, class 1: Secondary | ICD-10-CM | POA: Diagnosis not present

## 2024-07-18 DIAGNOSIS — E661 Drug-induced obesity: Secondary | ICD-10-CM | POA: Diagnosis not present

## 2024-07-18 DIAGNOSIS — I10 Essential (primary) hypertension: Secondary | ICD-10-CM | POA: Diagnosis not present

## 2024-07-18 LAB — MICROALBUMIN / CREATININE URINE RATIO
Creatinine,U: 109.9 mg/dL
Microalb Creat Ratio: 9.9 mg/g (ref 0.0–30.0)
Microalb, Ur: 1.1 mg/dL (ref 0.0–1.9)

## 2024-07-18 MED ORDER — CARVEDILOL 25 MG PO TABS: 25.0000 mg | ORAL_TABLET | Freq: Two times a day (BID) | ORAL | 3 refills | Status: DC

## 2024-07-18 MED ORDER — WEGOVY 0.25 MG/0.5ML ~~LOC~~ SOAJ: 0.2500 mg | SUBCUTANEOUS | 0 refills | Status: AC

## 2024-07-18 MED ORDER — ZEPBOUND 2.5 MG/0.5ML ~~LOC~~ SOAJ: 2.5000 mg | SUBCUTANEOUS | 5 refills | Status: DC

## 2024-07-18 NOTE — Telephone Encounter (Signed)
 Please see message and advise

## 2024-07-18 NOTE — Telephone Encounter (Signed)
Is Wegovy covered?

## 2024-07-18 NOTE — Progress Notes (Signed)
 Phone [phone] In person visit   Subjective:   Jaime Orr is a 73 y.o. year old very pleasant male patient who presents for/with See problem oriented charting Chief Complaint  Patient presents with   Hypertension   Medical Management of Chronic Issues    Would like to discuss weight loss medication;    Past Medical History-  Patient Active Problem List   Diagnosis Date Noted   Alcohol use 01/31/2011    Priority: High   Essential hypertension 09/27/2014    Priority: Medium    Dyslipidemia 08/11/2012    Priority: Medium    BPH with obstruction/lower urinary tract symptoms 08/05/2012    Priority: Medium    Horseshoe kidney     Priority: Medium    HYPERGLYCEMIA, MILD 01/19/2010    Priority: Medium    Depression, major, recurrent, in complete remission 12/02/2008    Priority: Medium    GERD 12/02/2008    Priority: Medium    Hypokalemia 01/31/2013    Priority: Low   Low serum testosterone  level 02/09/2012    Priority: Low   OBESITY 08/09/2010    Priority: Low   IRON DEFICIENCY 07/18/2010    Priority: Low   SNORING 07/18/2010    Priority: Low    Medications- reviewed and updated Current Outpatient Medications  Medication Sig Dispense Refill   amLODipine  (NORVASC ) 10 MG tablet TAKE 1 TABLET BY MOUTH EVERY DAY 90 tablet 3   atorvastatin  (LIPITOR) 20 MG tablet Take 1 tablet (20 mg total) by mouth daily. 90 tablet 1   escitalopram  (LEXAPRO ) 20 MG tablet Take 1 tablet (20 mg total) by mouth daily. 90 tablet 1   potassium chloride  SA (KLOR-CON  M) 20 MEQ tablet TAKE 1 TABLET BY MOUTH EVERY DAY 90 tablet 2   tamsulosin  (FLOMAX ) 0.4 MG CAPS capsule Take 1 capsule (0.4 mg total) by mouth daily. Take 1 capsule daily ( Please contact Urology for further refills if still managing) 30 capsule 0   tirzepatide (ZEPBOUND) 2.5 MG/0.5ML Pen Inject 2.5 mg into the skin once a week. 2 mL 5   valsartan -hydrochlorothiazide  (DIOVAN -HCT) 320-25 MG tablet Take 1 tablet by mouth daily.  90 tablet 2   carvedilol  (COREG ) 25 MG tablet Take 1 tablet (25 mg total) by mouth 2 (two) times daily with a meal. 180 tablet 3   No current facility-administered medications for this visit.     Objective:  BP 132/78 (BP Location: Left Arm, Patient Position: Sitting, Cuff Size: Normal)   Pulse 67   Temp 98.2 F (36.8 C) (Temporal)   Ht 5' 7.5 (1.715 m)   Wt 220 lb 12.8 oz (100.2 kg)   SpO2 96%   BMI 34.07 kg/m  Gen: NAD, resting comfortably CV: RRR no murmurs rubs or gallops Lungs: CTAB no crackles, wheeze, rhonchi Ext: trace edema Skin: warm, dry     Assessment and Plan    #hypertension S: medication: amlodipine  10 mg, carvedilol  25 mg bid increased through online communication, valsartan  hydrochlorothiazide  320-25 mg.  Slightly elevated previously and he had planned to work on reducing alcohol was 3-4 per day - he has not changed this substantially.  - needs potassium daily- reports history of low on meds Home readings #s: He has sent us  numerous readings 15 . Arm up. Does around 4 pm but not resting for 5 minutes avg 147.0 84.9   BP Readings from Last 3 Encounters:  07/18/24 132/78  05/19/24 (!) 146/80  01/01/24 (!) 142/88   A/P: blood pressure  looks better in office- readings slightly high at home but not resting before taking reading and encouraged him to try that plus hopefully working on weight loss can drive blood pressure lower. Remain on the higher dose carvedilol  full tablet twice daily and continue amlodipine  10 mg and valsartan  hydrochlorothiazide  320-25 mg - may consider eplerenone as potential treatment option but would need close monitoring of potassium - Also check microalbumin to creatinine ratio today especially with horseshoe kidney but GFR has looked excellent overall   # Obesity with BMI 34 S: Weight was up 5 pounds at last visit but he has been able to reverse this trend and is now down 10 pounds!  Congratulated efforts.  He had plans to reduce  alcohol intakefrom 3 to 4/day which can be very helpful and would be recommended regardless-has been able to quit in the past. - stops eating at 7 or 8 and finds that helpful then lunch next day is at 1 pm and take pills then -Patient remains very active with work. Wt Readings from Last 3 Encounters:  07/18/24 220 lb 12.8 oz (100.2 kg)  05/19/24 230 lb 6.4 oz (104.5 kg)  01/01/24 225 lb 12.8 oz (102.4 kg)   A/P: we went over benefits and risks of Zepbound.  He ultimately opted to try this for obesity.  I do want him to make sure that he gets his protein in at each meal and would love for him to continue his active work-weight lifting can be beneficial as some people lose muscle mass on these medications without that.  Also discussed this is likely a long-term treatment due to potential weight gain afterwards.  Directed him to the website which has very good videos on self injecting the Zepbound.  We will use Wegovy as an alternative if not covered he will reach out to us  -Encouraged need for healthy eating, regular exercise, weight loss.  -Alcohol reduction could also help with not only with blood pressure but weight management as well   Recommended follow up: Return in about 3 months (around 10/18/2024) for followup or sooner if needed.Schedule b4 you leave.  Lab/Order associations:   ICD-10-CM   1. Class 1 drug-induced obesity with serious comorbidity and body mass index (BMI) of 34.0 to 34.9 in adult  E66.811    E66.1    Z68.34     2. Essential hypertension  I10 Microalbumin / creatinine urine ratio      Meds ordered this encounter  Medications   tirzepatide (ZEPBOUND) 2.5 MG/0.5ML Pen    Sig: Inject 2.5 mg into the skin once a week.    Dispense:  2 mL    Refill:  5    E66.811   carvedilol  (COREG ) 25 MG tablet    Sig: Take 1 tablet (25 mg total) by mouth 2 (two) times daily with a meal.    Dispense:  180 tablet    Refill:  3    Return precautions advised.  Garnette Lukes,  MD

## 2024-07-18 NOTE — Telephone Encounter (Signed)
 Pharmacy Patient Advocate Encounter   Received notification from Pt Calls Messages that prior authorization for Zepbound 2.5MG /0.5ML Auto injectors is required/requested.    Per test claim, medication is not covered due to plan/benefit exclusion, PA not submitted at this time

## 2024-07-18 NOTE — Patient Instructions (Addendum)
 Trial Zepbound 2.5 mg once a week-their website has very good instructions on how to inject this at home.  If this is not covered we may alternatively try [facility] which is very similar  blood pressure looks better in office- readings slightly high at home but not resting before taking reading and encouraged him to try that plus hopefully working on weight loss can drive blood pressure lower. Remain on the higher dose carvedilol  full tablet twice daily and continue amlodipine  10 mg and valsartan  hydrochlorothiazide  320-25mg   Also for blood pressure and liver health strongly encourage max 2 alcoholic beverages per day- less would be even better  Recommended follow up: Return in about 3 months (around 10/18/2024) for followup or sooner if needed.Schedule b4 you leave.

## 2024-07-18 NOTE — Telephone Encounter (Signed)
 I have sent this in if you could try it

## 2024-07-19 ENCOUNTER — Ambulatory Visit: Payer: Self-pay | Admitting: Family Medicine

## 2024-07-21 ENCOUNTER — Other Ambulatory Visit (HOSPITAL_COMMUNITY): Payer: Self-pay

## 2024-07-21 ENCOUNTER — Telehealth: Payer: Self-pay

## 2024-07-21 NOTE — Telephone Encounter (Signed)
 Pharmacy Patient Advocate Encounter   Received notification from Physician's Office that prior authorization for Wegovy 0.25MG /0.5ML auto-injectors is required/requested.   Insurance verification completed.   The patient is insured through [facility].   Per test claim: PA required; PA submitted to above mentioned insurance via Latent Key/confirmation #/EOC [id number] Status is pending

## 2024-07-22 ENCOUNTER — Other Ambulatory Visit (HOSPITAL_COMMUNITY): Payer: Self-pay

## 2024-07-22 NOTE — Telephone Encounter (Signed)
 Pharmacy Patient Advocate Encounter  Received notification from [facility] MEDICARE that Prior Authorization for [facility] 0.25MG /0.5ML auto-injectors  has been DENIED.  Full denial letter will be uploaded to the media tab. See denial reason below.    PA #/Case ID/Reference #: [id number]

## 2024-07-23 ENCOUNTER — Other Ambulatory Visit (HOSPITAL_COMMUNITY): Payer: Self-pay

## 2024-07-23 NOTE — Telephone Encounter (Signed)
 Please let patient know that our prior authorization team tried but this medication is excluded from his formulary-will need to work on healthy eating/exercise to bring weight down

## 2024-07-30 ENCOUNTER — Other Ambulatory Visit: Payer: Self-pay | Admitting: Family Medicine

## 2024-07-30 ENCOUNTER — Other Ambulatory Visit: Payer: Self-pay | Admitting: Family

## 2024-07-31 ENCOUNTER — Other Ambulatory Visit (HOSPITAL_COMMUNITY): Payer: Self-pay

## 2024-08-04 NOTE — Telephone Encounter (Signed)
 I will refill meds. Please advise message about wegovy.

## 2024-08-05 ENCOUNTER — Other Ambulatory Visit: Payer: Self-pay

## 2024-08-05 ENCOUNTER — Other Ambulatory Visit: Payer: Self-pay | Admitting: Family Medicine

## 2024-08-05 DIAGNOSIS — E661 Drug-induced obesity: Secondary | ICD-10-CM

## 2024-08-05 MED ORDER — AMLODIPINE BESYLATE 10 MG PO TABS: 10.0000 mg | ORAL_TABLET | Freq: Every day | ORAL | 3 refills | Status: AC

## 2024-08-12 ENCOUNTER — Other Ambulatory Visit: Payer: Self-pay | Admitting: Family Medicine

## 2024-08-12 ENCOUNTER — Other Ambulatory Visit (HOSPITAL_COMMUNITY): Payer: Self-pay

## 2024-08-12 NOTE — Telephone Encounter (Signed)
 PA Denied. Please see message from Rx Prior Auth Team.

## 2024-08-12 NOTE — Telephone Encounter (Signed)
 Please let patient know that was denied by insurance-I feel like he may already be aware but just double check

## 2024-08-13 ENCOUNTER — Encounter (INDEPENDENT_AMBULATORY_CARE_PROVIDER_SITE_OTHER): Payer: Self-pay

## 2024-09-26 ENCOUNTER — Other Ambulatory Visit: Payer: Self-pay | Admitting: Family Medicine

## 2024-10-17 NOTE — Progress Notes (Unsigned)
 "               Jaime Orr Jaime [facility]r [facility] [address] [place] [address] Phone: [phone]   Assessment and Plan:     1. Right hand pain (Primary) 2. De Quervain's tenosynovitis, right -Acute, initial visit - Consistent with DeQuervain's Tenosynovitis of right hand/wrist due to excess strain with wheelbarrow work 2 weeks ago - Patient elected for DeQuervain's Tenosynovitis CSI.  Tolerated well per note below.  CSI may temporarily increase blood pressure and patient with past medical history of hypertension - Recommend relative rest for 2 weeks - Topical Voltaren gel over areas of pain  Procedure: Ultrasound Guided De Quervain Tendon Sheath Injection Side: Right Diagnosis: DeQuervain's Tenosynovitis US  Indication:  - accuracy is paramount for diagnosis - to ensure therapeutic efficacy or procedural success - to reduce procedural risk  After explaining the procedure, viable alternatives, risks, and answering any questions, consent was given verbally. The site was cleaned with chlorhexidine  prep. An ultrasound transducer was placed on the dorsal wrist.  The 1st dorsal wrist compartment was identified.  There was fluid surrounding the tendons and within the sheath. The tendons were Normal.  A steroid injection was performed under ultrasound guidance using  1ml of 1% lidocaine  without epinephrine and 20 mg of Kenalog  40. This was well tolerated and resulted in  relief.  Needle was removed and dressing placed and post injection instructions were given including  a discussion of likely return of pain today after the anesthetic wears off (with the possibility of worsened pain) until the steroid starts to work in 1-3 days.   Pt was advised to call or return to clinic if these symptoms worsen or fail to improve as anticipated. Images permanently stored.     Pertinent previous records reviewed include none   Follow Up: As needed if no improvement or worsening of  symptoms.  Could consider HEP versus physical therapy versus NSAID course versus thumb spica wrist brace   Subjective:   I, Jaime Orr, am serving as a neurosurgeon for Jaime Orr  Chief Complaint: right wrist and hand pain   HPI:   10/20/2024 Patient is a 74 year old male with right wrist and hand pain. Patient states pain started 2 weeks ago. He was using a wheelbarrow. Decreased ROM. Pain radiates from the wrist to the hand. Decreased grip strength. No meds for the pain.     Relevant Historical Information: Hypertension  Additional pertinent review of systems negative.  Current Medications[1]   Objective:     Vitals:   10/20/24 0831  BP: (!) 146/80  Pulse: 66  SpO2: 97%  Weight: 220 lb (99.8 kg)  Height: 5' 7 (1.702 m)      Body mass index is 34.46 kg/m.    Physical Exam:    General: Appears well, nad, nontoxic and pleasant Neuro:sensation intact, strength is 5/5 in upper extremities, muscle tone wnl Skin:no susupicious lesions or rashes  Right hand/Wrist:   No deformity or swelling appreciated. ROM  Ext 90, flexion 70, radial/ulnar deviation 30 TTP first dorsal wrist compartment nttp over the snuff box, dorsal carpals, volar carpals, radial styloid, ulnar styloid, 1st mcp, tfcc Negative Tinel's, Phalen's, Prayer Tests Positive finklestein Neg tfcc bounce test No pain with resisted ext, flex or deviation    Electronically signed by:  Jaime Orr Jaime Orr [facility] 9:03 AM 10/20/24     [1]  Current Outpatient Medications:    amLODipine  (  NORVASC ) 10 MG tablet, Take 1 tablet (10 mg total) by mouth daily., Disp: 90 tablet, Rfl: 3   atorvastatin  (LIPITOR) 20 MG tablet, Take 1 tablet (20 mg total) by mouth daily., Disp: 90 tablet, Rfl: 1   carvedilol  (COREG ) 25 MG tablet, TAKE 0.5 TABLETS (12.5 MG TOTAL) BY MOUTH 2 (TWO) TIMES DAILY WITH A MEAL., Disp: 90 tablet, Rfl: 1   escitalopram  (LEXAPRO ) 20 MG tablet, Take 1 tablet (20 mg  total) by mouth daily., Disp: 90 tablet, Rfl: 1   potassium chloride  SA (KLOR-CON  M) 20 MEQ tablet, TAKE 1 TABLET BY MOUTH EVERY DAY, Disp: 90 tablet, Rfl: 2   semaglutide -weight management (WEGOVY ) 0.25 MG/0.5ML SOAJ SQ injection, Inject 0.25 mg into the skin once a week., Disp: 2 mL, Rfl: 0   tamsulosin  (FLOMAX ) 0.4 MG CAPS capsule, Take 1 capsule (0.4 mg total) by mouth daily. Take 1 capsule daily ( Please contact Urology for further refills if still managing), Disp: 30 capsule, Rfl: 0   valsartan -hydrochlorothiazide  (DIOVAN -HCT) 320-25 MG tablet, Take 1 tablet by mouth daily., Disp: 90 tablet, Rfl: 2  "

## 2024-10-20 ENCOUNTER — Other Ambulatory Visit: Payer: Self-pay

## 2024-10-20 ENCOUNTER — Ambulatory Visit: Admitting: Sports Medicine

## 2024-10-20 ENCOUNTER — Ambulatory Visit

## 2024-10-20 VITALS — BP 146/80 | HR 66 | Ht 67.0 in | Wt 220.0 lb

## 2024-10-20 DIAGNOSIS — M79641 Pain in right hand: Secondary | ICD-10-CM | POA: Diagnosis not present

## 2024-10-20 DIAGNOSIS — M654 Radial styloid tenosynovitis [de Quervain]: Secondary | ICD-10-CM

## 2024-10-20 NOTE — Patient Instructions (Signed)
 Voltaren gel over areas of pain   Relative rest for 2 weeks   As needed follow up

## 2024-10-22 ENCOUNTER — Ambulatory Visit: Admitting: Family Medicine

## 2024-10-24 ENCOUNTER — Ambulatory Visit: Payer: Self-pay | Admitting: Sports Medicine

## 2024-10-25 ENCOUNTER — Other Ambulatory Visit: Payer: Self-pay | Admitting: Family Medicine
